# Patient Record
Sex: Male | Born: 1975 | Race: Black or African American | Hispanic: No | Marital: Married | Smoking: Light tobacco smoker
Health system: Southern US, Community
[De-identification: ages and names within clinical notes are randomized; demographics above are authoritative.]

## PROBLEM LIST (undated history)

## (undated) DIAGNOSIS — R569 Unspecified convulsions: Secondary | ICD-10-CM

## (undated) DIAGNOSIS — Z789 Other specified health status: Secondary | ICD-10-CM

## (undated) HISTORY — PX: TONSILLECTOMY: SUR1361

---

## 1998-09-05 ENCOUNTER — Encounter: Admission: RE | Admit: 1998-09-05 | Discharge: 1998-09-05 | Payer: Self-pay | Admitting: *Deleted

## 2000-01-25 ENCOUNTER — Emergency Department (HOSPITAL_COMMUNITY): Admission: EM | Admit: 2000-01-25 | Discharge: 2000-01-25 | Payer: Self-pay | Admitting: Internal Medicine

## 2002-07-14 ENCOUNTER — Emergency Department (HOSPITAL_COMMUNITY): Admission: EM | Admit: 2002-07-14 | Discharge: 2002-07-14 | Payer: Self-pay | Admitting: Emergency Medicine

## 2002-07-14 ENCOUNTER — Encounter: Payer: Self-pay | Admitting: Emergency Medicine

## 2002-07-23 ENCOUNTER — Emergency Department (HOSPITAL_COMMUNITY): Admission: EM | Admit: 2002-07-23 | Discharge: 2002-07-23 | Payer: Self-pay | Admitting: Emergency Medicine

## 2005-03-21 ENCOUNTER — Emergency Department (HOSPITAL_COMMUNITY): Admission: EM | Admit: 2005-03-21 | Discharge: 2005-03-21 | Payer: Self-pay | Admitting: Emergency Medicine

## 2005-05-10 ENCOUNTER — Emergency Department (HOSPITAL_COMMUNITY): Admission: EM | Admit: 2005-05-10 | Discharge: 2005-05-10 | Payer: Self-pay | Admitting: Emergency Medicine

## 2005-05-21 ENCOUNTER — Emergency Department (HOSPITAL_COMMUNITY): Admission: EM | Admit: 2005-05-21 | Discharge: 2005-05-21 | Payer: Self-pay | Admitting: Emergency Medicine

## 2006-04-23 ENCOUNTER — Emergency Department (HOSPITAL_COMMUNITY): Admission: EM | Admit: 2006-04-23 | Discharge: 2006-04-23 | Payer: Self-pay | Admitting: Emergency Medicine

## 2008-04-04 ENCOUNTER — Emergency Department (HOSPITAL_COMMUNITY): Admission: EM | Admit: 2008-04-04 | Discharge: 2008-04-04 | Payer: Self-pay | Admitting: Emergency Medicine

## 2019-07-17 ENCOUNTER — Emergency Department (HOSPITAL_COMMUNITY): Payer: Self-pay

## 2019-07-17 ENCOUNTER — Encounter (HOSPITAL_COMMUNITY): Payer: Self-pay | Admitting: Emergency Medicine

## 2019-07-17 ENCOUNTER — Emergency Department (HOSPITAL_COMMUNITY)
Admission: EM | Admit: 2019-07-17 | Discharge: 2019-07-17 | Disposition: A | Discharge status: Home / Self Care | Payer: Self-pay | Attending: Emergency Medicine | Admitting: Emergency Medicine

## 2019-07-17 ENCOUNTER — Other Ambulatory Visit: Payer: Self-pay

## 2019-07-17 DIAGNOSIS — W458XXA Other foreign body or object entering through skin, initial encounter: Secondary | ICD-10-CM | POA: Insufficient documentation

## 2019-07-17 DIAGNOSIS — Y929 Unspecified place or not applicable: Secondary | ICD-10-CM | POA: Insufficient documentation

## 2019-07-17 DIAGNOSIS — Y999 Unspecified external cause status: Secondary | ICD-10-CM | POA: Insufficient documentation

## 2019-07-17 DIAGNOSIS — Y9389 Activity, other specified: Secondary | ICD-10-CM | POA: Insufficient documentation

## 2019-07-17 DIAGNOSIS — S61442A Puncture wound with foreign body of left hand, initial encounter: Secondary | ICD-10-CM | POA: Insufficient documentation

## 2019-07-17 DIAGNOSIS — F17290 Nicotine dependence, other tobacco product, uncomplicated: Secondary | ICD-10-CM | POA: Insufficient documentation

## 2019-07-17 DIAGNOSIS — Z23 Encounter for immunization: Secondary | ICD-10-CM | POA: Insufficient documentation

## 2019-07-17 DIAGNOSIS — S6992XA Unspecified injury of left wrist, hand and finger(s), initial encounter: Secondary | ICD-10-CM

## 2019-07-17 DIAGNOSIS — S60552A Superficial foreign body of left hand, initial encounter: Secondary | ICD-10-CM

## 2019-07-17 MED ORDER — OXYCODONE-ACETAMINOPHEN 5-325 MG PO TABS
1.0000 | ORAL_TABLET | Freq: Once | ORAL | Status: AC
  Administered 2019-07-17: 12:00:00 1 via ORAL
  Filled 2019-07-17: qty 1

## 2019-07-17 MED ORDER — OXYCODONE-ACETAMINOPHEN 5-325 MG PO TABS: 1.0000 | ORAL_TABLET | ORAL | 0 refills | Status: DC | PRN

## 2019-07-17 MED ORDER — LIDOCAINE HCL (PF) 1 % IJ SOLN
10.0000 mL | Freq: Once | INTRAMUSCULAR | Status: AC
  Administered 2019-07-17: 10 mL
  Filled 2019-07-17: qty 10

## 2019-07-17 MED ORDER — LIDOCAINE HCL (PF) 1 % IJ SOLN
INTRAMUSCULAR | Status: AC
  Filled 2019-07-17: qty 10

## 2019-07-17 MED ORDER — TETANUS-DIPHTH-ACELL PERTUSSIS 5-2.5-18.5 LF-MCG/0.5 IM SUSP
0.5000 mL | Freq: Once | INTRAMUSCULAR | Status: AC
  Administered 2019-07-17: 0.5 mL via INTRAMUSCULAR
  Filled 2019-07-17: qty 0.5

## 2019-07-17 MED ORDER — CEPHALEXIN 500 MG PO CAPS: 500.0000 mg | ORAL_CAPSULE | Freq: Two times a day (BID) | ORAL | 0 refills | Status: AC

## 2019-07-17 NOTE — Consult Note (Signed)
Reason for Consult:Arrow to hand Referring Physician: A Sabino Donovan is an 43 y.o. male.  HPI: Kathreen Cosier was practicing with his bow when one of the arrows broke upon release, driving the shaft through his left hand. He came to the ED for evaluation and hand surgery was consulted. He is RHD.  History reviewed. No pertinent past medical history.  History reviewed. No pertinent surgical history.  No family history on file.  Social History:  reports that he has been smoking cigars. He has never used smokeless tobacco. He reports current alcohol use. He reports previous drug use.  Allergies: No Known Allergies  Medications: I have reviewed the patient's current medications.  No results found for this or any previous visit (from the past 48 hour(s)).  Dg Hand Complete Left  Result Date: 07/17/2019 CLINICAL DATA:  Arrow in hand EXAM: LEFT HAND - COMPLETE 3+ VIEW COMPARISON:  None. FINDINGS: No fracture or dislocation of the left hand. An arrow traverses the soft tissues of the thenar eminence and does not appear to abut or penetrate the osseous structures. Joint spaces are preserved. No other foreign body appreciated. IMPRESSION: No fracture or dislocation of the left hand. An arrow traverses the soft tissues of the thenar eminence and does not appear to abut or penetrate the osseous structures. Electronically Signed   By: Eddie Candle M.D.   On: 07/17/2019 11:46    Review of Systems  Constitutional: Negative for weight loss.  HENT: Negative for ear discharge, ear pain, hearing loss and tinnitus.   Eyes: Negative for blurred vision, double vision, photophobia and pain.  Respiratory: Negative for cough, sputum production and shortness of breath.   Cardiovascular: Negative for chest pain.  Gastrointestinal: Negative for abdominal pain, nausea and vomiting.  Genitourinary: Negative for dysuria, flank pain, frequency and urgency.  Musculoskeletal: Positive for joint pain (Left hand).  Negative for back pain, falls, myalgias and neck pain.  Neurological: Negative for dizziness, tingling, sensory change, focal weakness, loss of consciousness and headaches.  Endo/Heme/Allergies: Does not bruise/bleed easily.  Psychiatric/Behavioral: Negative for depression, memory loss and substance abuse. The patient is not nervous/anxious.    Blood pressure (!) 150/77, pulse 86, resp. rate 18, SpO2 99 %. Physical Exam  Constitutional: He appears well-developed and well-nourished. No distress.  HENT:  Head: Normocephalic and atraumatic.  Eyes: Conjunctivae are normal. Right eye exhibits no discharge. Left eye exhibits no discharge. No scleral icterus.  Neck: Normal range of motion.  Cardiovascular: Normal rate and regular rhythm.  Respiratory: Effort normal. No respiratory distress.  Musculoskeletal:     Comments: Left shoulder, elbow, wrist, digits- Arrow through thenar, moderately TTP, no instability, no blocks to motion  Sens  Ax/R/M/U intact  Mot   Ax/ R/ PIN/ M/ AIN/ U intact  Rad 2+  Neurological: He is alert.  Skin: Skin is warm and dry. He is not diaphoretic.  Psychiatric: He has a normal mood and affect. His behavior is normal.    Assessment/Plan: Left hand arrow injury -- Instructed EDPA to numb and remove in the direction of it's original path. Then irrigate the wound copiously and repeat x-ray to look for any e/o retained FB. Advised leaving wounds open for drainage and f/u with Dr. Fredna Dow next week.    Lisette Abu, PA-C Orthopedic Surgery [phone] 07/17/2019, 11:54 AM

## 2019-07-17 NOTE — ED Provider Notes (Signed)
[facility] EMERGENCY DEPARTMENT Provider Note   CSN: [id number] Arrival date & time: 07/17/19  1054     History   Chief Complaint Chief Complaint  Patient presents with   Hand Injury    Arrow Lodged in Left Hand     HPI Nicholas Fletcher is a 43 y.o. male who presents to the ED today with a left hand injury.  He reports he was in target practice today with his bow and arrow when he accidentally shot himself in the left hand with the area.  He states that he believes that the area must of had a break in it and when he shot it at the front part went forward but the back part dislodged and went into his hand.  Occurred about 1 hour ago.  Patient does not think his tetanus is up-to-date.  He is complaining of pain to the area.  He is controlled.  He is not anticoagulated.  He denies any numbness or tingling to his fingers.  No other complaints at this time.      History reviewed. No pertinent past medical history.  There are no active problems to display for this patient.   History reviewed. No pertinent surgical history.      Home Medications    Prior to Admission medications   Not on File    Family History No family history on file.  Social History Social History   Tobacco Use   Smoking status: Light Tobacco Smoker    Types: Cigars   Smokeless tobacco: Never Used  Substance Use Topics   Alcohol use: Yes   Drug use: Not Currently     Allergies   Patient has no known allergies.   Review of Systems Review of Systems  Constitutional: Negative for chills and fever.  HENT: Negative for congestion.   Eyes: Negative for visual disturbance.  Respiratory: Negative for shortness of breath.   Cardiovascular: Negative for chest pain.  Gastrointestinal: Negative for nausea and vomiting.  Genitourinary: Negative for difficulty urinating.  Musculoskeletal: Positive for arthralgias.  Skin: Positive for wound.  Neurological: Negative for weakness  and numbness.       Negative for paresthesias     Physical Exam Updated Vital Signs BP (!) 150/77 (BP Location: Right Arm)    Pulse 86    Resp 18    SpO2 99%   Physical Exam Vitals signs and nursing note reviewed.  Constitutional:      Appearance: He is not ill-appearing.  HENT:     Head: Normocephalic and atraumatic.  Eyes:     Conjunctiva/sclera: Conjunctivae normal.  Neck:     Musculoskeletal: Neck supple.  Cardiovascular:     Rate and Rhythm: Normal rate and regular rhythm.  Pulmonary:     Effort: Pulmonary effort is normal.     Breath sounds: Normal breath sounds.  Abdominal:     Palpations: Abdomen is soft.     Tenderness: There is no abdominal tenderness.  Musculoskeletal:     Comments: Back portion of arrow lodged into left hand. It appears to go through the thenar eminence on palmar aspect. Bleeding controlled. Cap refill < 2 seconds to all fingers on left hand. Pt able to wiggle all fingers without difficulty. NVI. Strength and sensation intact. 2+ radial pulse  Skin:    General: Skin is warm and dry.  Neurological:     Mental Status: He is alert.          ED  Treatments / Results  Labs (all labs ordered are listed, but only abnormal results are displayed) Labs Reviewed - No data to display  EKG None  Radiology Dg Hand Complete Left  Result Date: 07/17/2019 CLINICAL DATA:  S/p arrow removal EXAM: LEFT HAND - COMPLETE 3+ VIEW COMPARISON:  Same day radiographs FINDINGS: Interval removal of a previously seen arrow penetrating the soft tissues of the left thenar eminence. There is subcutaneous emphysema at the site of the wound without fracture or residual radiopaque foreign body. Joint spaces are preserved. IMPRESSION: Interval removal of a previously seen arrow penetrating the soft tissues of the left thenar eminence. There is subcutaneous emphysema at the site of the wound without fracture or residual radiopaque foreign body. Electronically Signed   By:  Eddie Candle M.D.   On: 07/17/2019 13:35   Dg Hand Complete Left  Result Date: 07/17/2019 CLINICAL DATA:  Arrow in hand EXAM: LEFT HAND - COMPLETE 3+ VIEW COMPARISON:  None. FINDINGS: No fracture or dislocation of the left hand. An arrow traverses the soft tissues of the thenar eminence and does not appear to abut or penetrate the osseous structures. Joint spaces are preserved. No other foreign body appreciated. IMPRESSION: No fracture or dislocation of the left hand. An arrow traverses the soft tissues of the thenar eminence and does not appear to abut or penetrate the osseous structures. Electronically Signed   By: Eddie Candle M.D.   On: 07/17/2019 11:46    Procedures .Foreign Body Removal  Date/Time: 07/17/2019 12:51 PM Performed by: Eustaquio Maize, PA-C Authorized by: Eustaquio Maize, PA-C  Consent: Verbal consent obtained. Consent given by: patient Body area: skin General location: upper extremity Location details: left hand Anesthesia: local infiltration  Anesthesia: Local Anesthetic: lidocaine 1% without epinephrine Anesthetic total: 15 mL Complexity: simple 1 objects recovered. Objects recovered: arrow  Post-procedure assessment: foreign body removed   (including critical care time)  Medications Ordered in ED Medications  Tdap (BOOSTRIX) injection 0.5 mL (0.5 mLs Intramuscular Given 07/17/19 1152)  oxyCODONE-acetaminophen (PERCOCET/ROXICET) 5-325 MG per tablet 1 tablet (1 tablet Oral Given 07/17/19 1151)  lidocaine (PF) (XYLOCAINE) 1 % injection 10 mL (10 mLs Infiltration Given 07/17/19 1315)     Initial Impression / Assessment and Plan / ED Course  I have reviewed the triage vital signs and the nursing notes.  Pertinent labs & imaging results that were available during my care of the patient were reviewed by me and considered in my medical decision making (see chart for details).    43 year old male who presents to the ED today shooting self and hand with wooden  arrow.  Patient is neurovascularly intact on exam.  Bleeding is controlled.  X-ray was obtained prior to being seen. It Is difficult to visualize the wood on x-ray but there does not appear to be any bony involvement at this time.  Given the arrow is wooden I am concerned that there could be splintering with removal.  Will consult hand surgery at this time.  Will update tetanus in the ED and give pain medication.   Discussed case with Hilbert Odor, PA-C with hand surgery.  He will come to the ED to evaluate patient.   Hand surgery suggests removal of arrow in the ED today by myself. He is to follow up outpatient with Dr. Fredna Dow next week.   Arrow successfully removed from hand. Will repeat xray at this time prior to discharge home.   Xray without findings of foreign body. Will discharge patient home  at this time with close hand surgery follow up. Wounds left open to drain. Will prescribe antibiotics at this time as well. Pt counseled on importance of taking full course of abx. All questions answered at this time. Strict return precautions have also been discussed. He is in agreement with plan at this time and stable for discharge home.   This note was prepared using Dragon voice recognition software and may include unintentional dictation errors due to the inherent limitations of voice recognition software.       Final Clinical Impressions(s) / ED Diagnoses   Final diagnoses:  Injury of left hand, initial encounter  Foreign body in hand, left, initial encounter    ED Discharge Orders    None       Tanda Rockers, Cordelia Poche 07/17/19 1913    Lorre Nick, MD 07/20/19 1231

## 2019-07-17 NOTE — ED Triage Notes (Signed)
Pt presents with wooden arrowllodged in his left hand. He was practicing when the arrow show backwards. No bleeding noted. Pt is stable. CNS intact. Ice placed on injury.

## 2019-07-17 NOTE — ED Notes (Signed)
Patient transported to X-ray 

## 2019-07-17 NOTE — Discharge Instructions (Signed)
Please take antibiotics as prescribed. It is important to take them until they run out.  Follow up with Dr. Fredna Dow. Please call their office to schedule an appointment  Keep wound clean and dry I have prescribed a short course of pain medication to take if needbe. It is recommended to take Ibuprofen for the pain and then if you are still having pain to take the Percocet.

## 2019-07-17 NOTE — ED Notes (Signed)
Wound soaked/cleaned with sterile water and betadine.

## 2019-07-17 NOTE — ED Notes (Signed)
Patient verbalizes understanding of discharge instructions. Opportunity for questioning and answers were provided. Armband removed by staff, pt discharged from ED.  

## 2020-10-03 IMAGING — CR DG HAND COMPLETE 3+V*L*
3 series · 3 of 3 positions shown · non-contrast
Comparison: None.

CLINICAL DATA: Arrow in hand

EXAM:
LEFT HAND - COMPLETE 3+ VIEW

[hand pa]
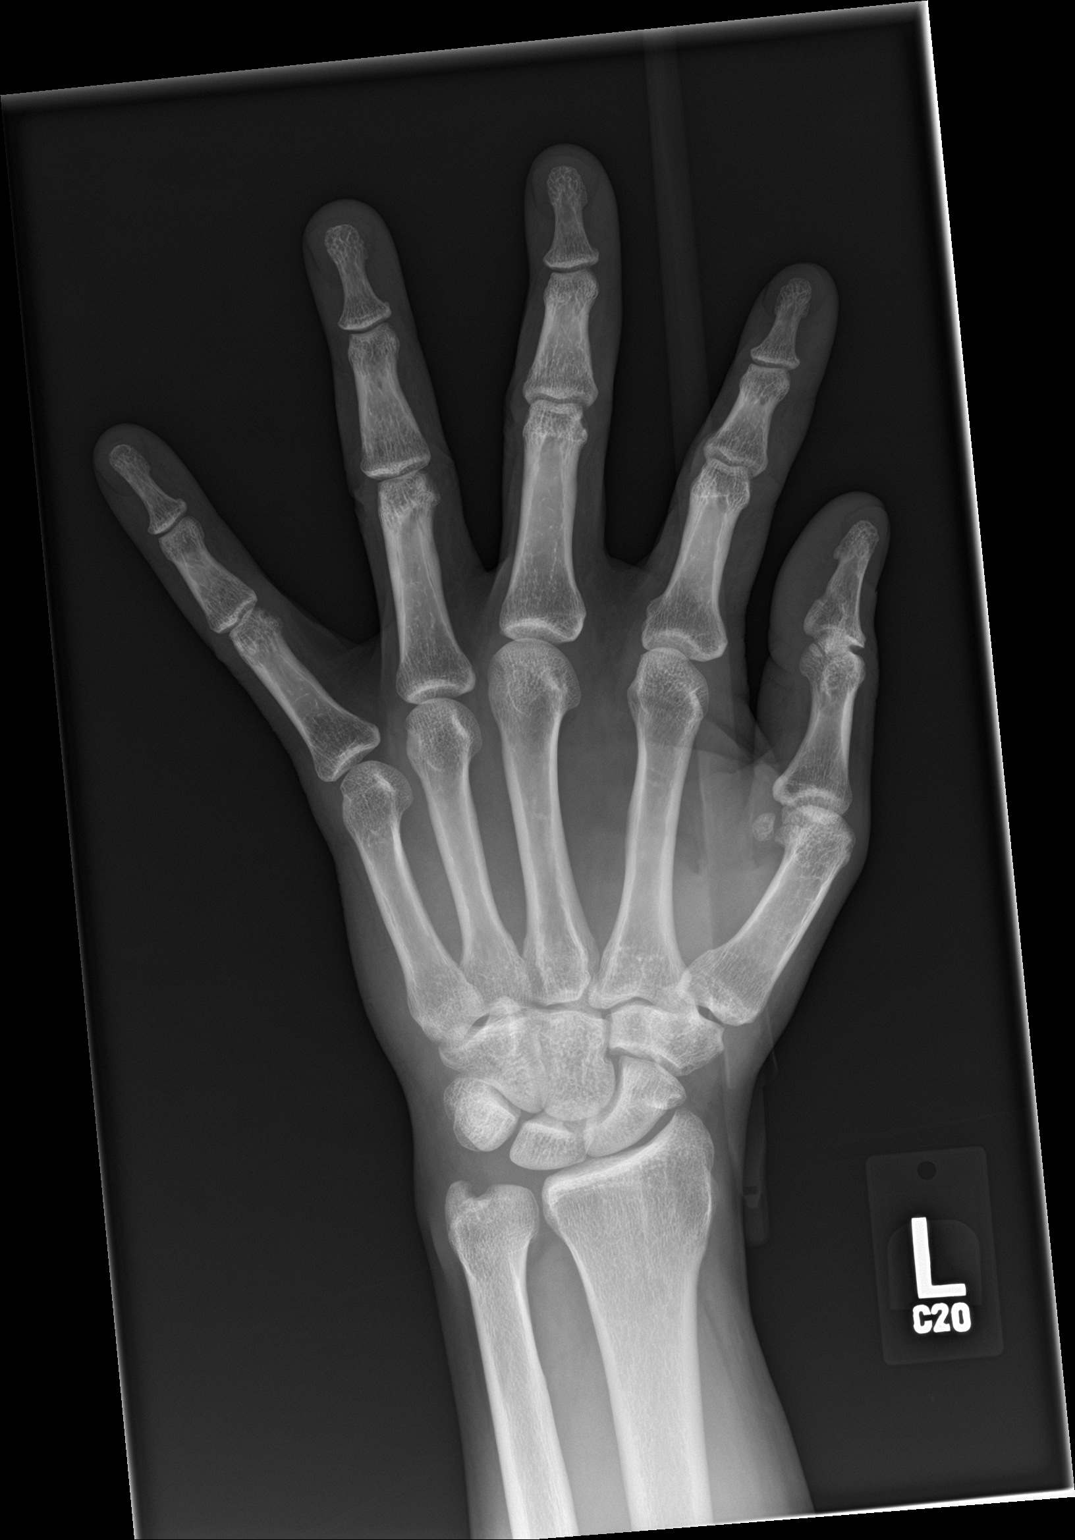

[hand obl]
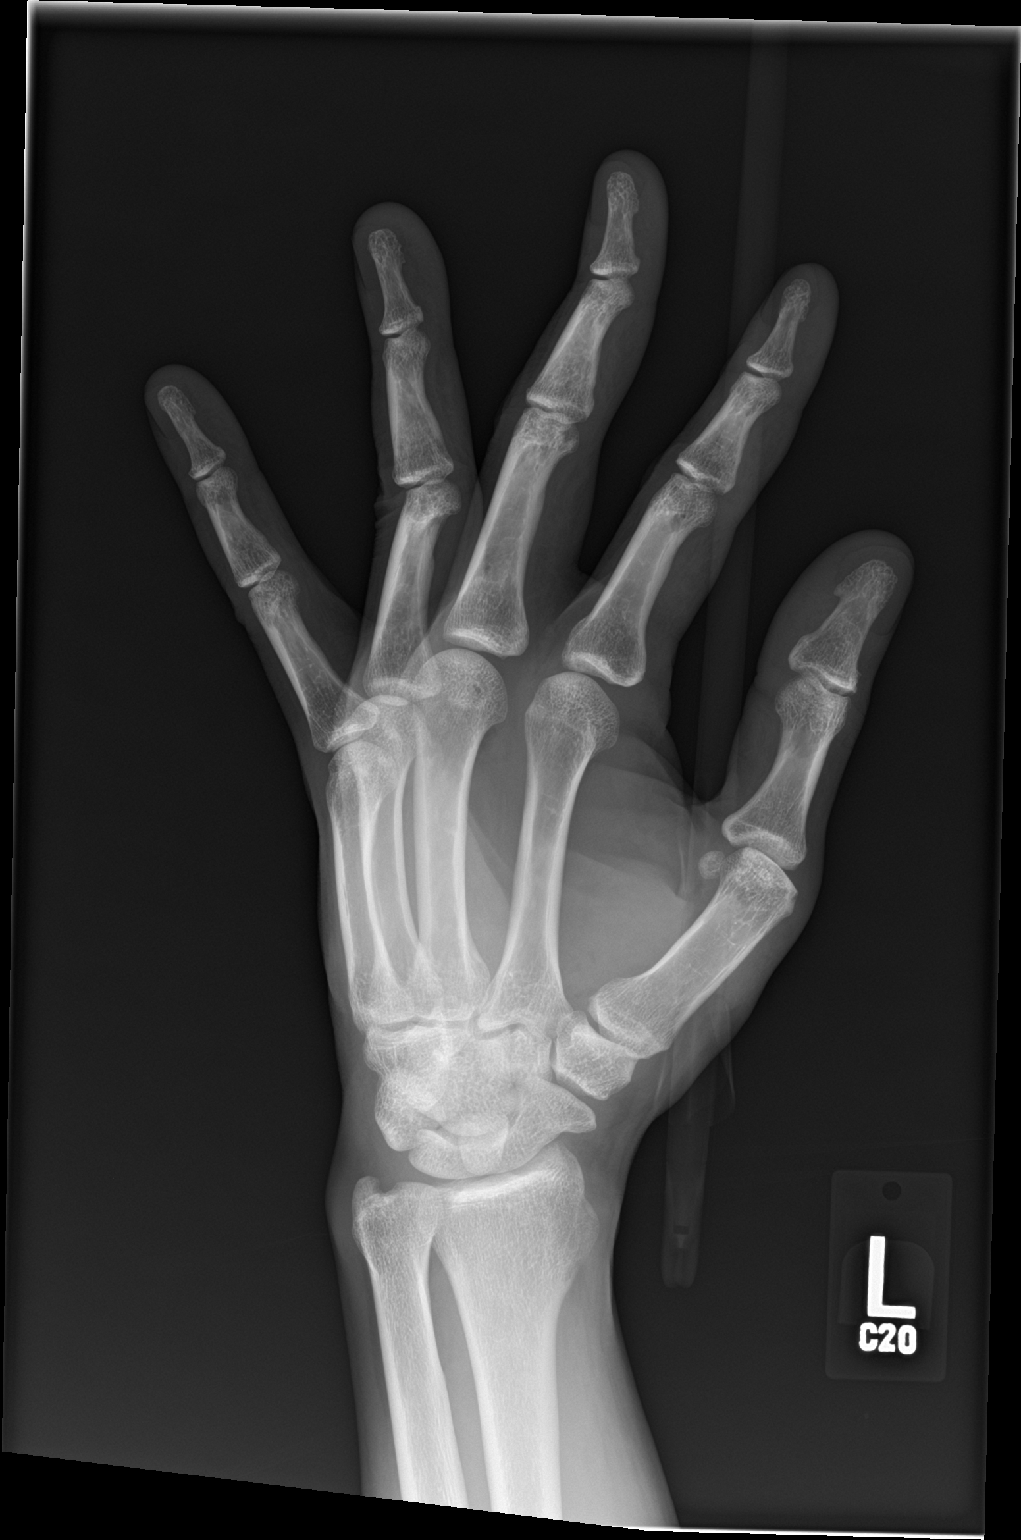

[hand lat]
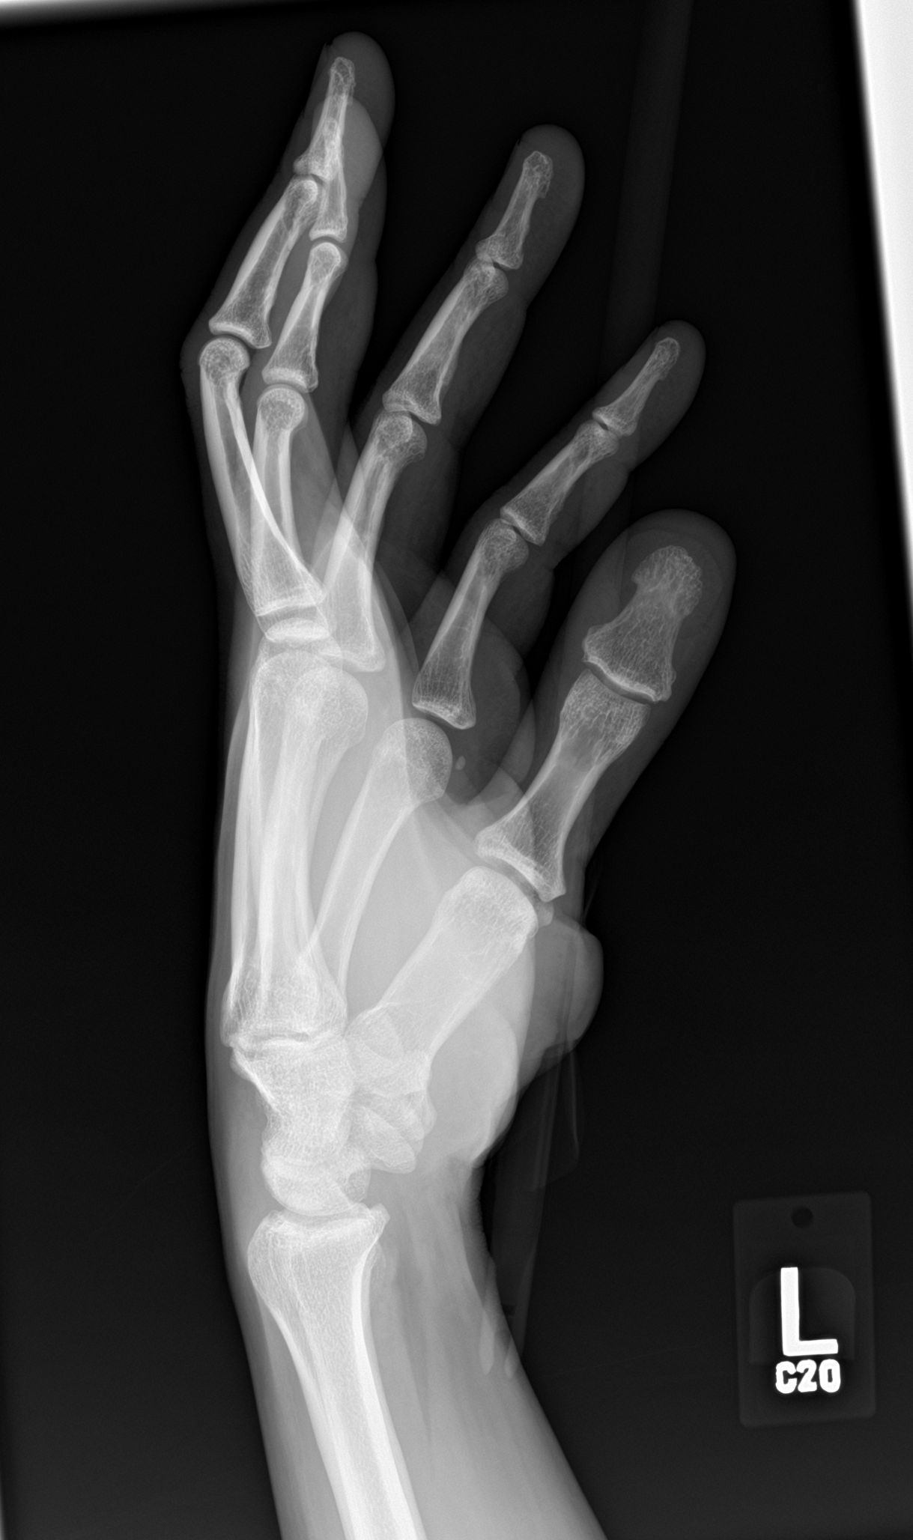

[3 of 3 positions shown; findings below may reference images not displayed]

FINDINGS: No fracture or dislocation of the left hand. An arrow traverses the
soft tissues of the thenar eminence and does not appear to abut or
penetrate the osseous structures. Joint spaces are preserved. No
other foreign body appreciated.
IMPRESSION: No fracture or dislocation of the left hand. An arrow traverses the
soft tissues of the thenar eminence and does not appear to abut or
penetrate the osseous structures.

## 2020-10-03 IMAGING — DX DG HAND COMPLETE 3+V*L*
3 series · 3 of 3 positions shown · non-contrast
Comparison: Same day radiographs

CLINICAL DATA: S/p arrow removal

EXAM:
LEFT HAND - COMPLETE 3+ VIEW

[hand pa]
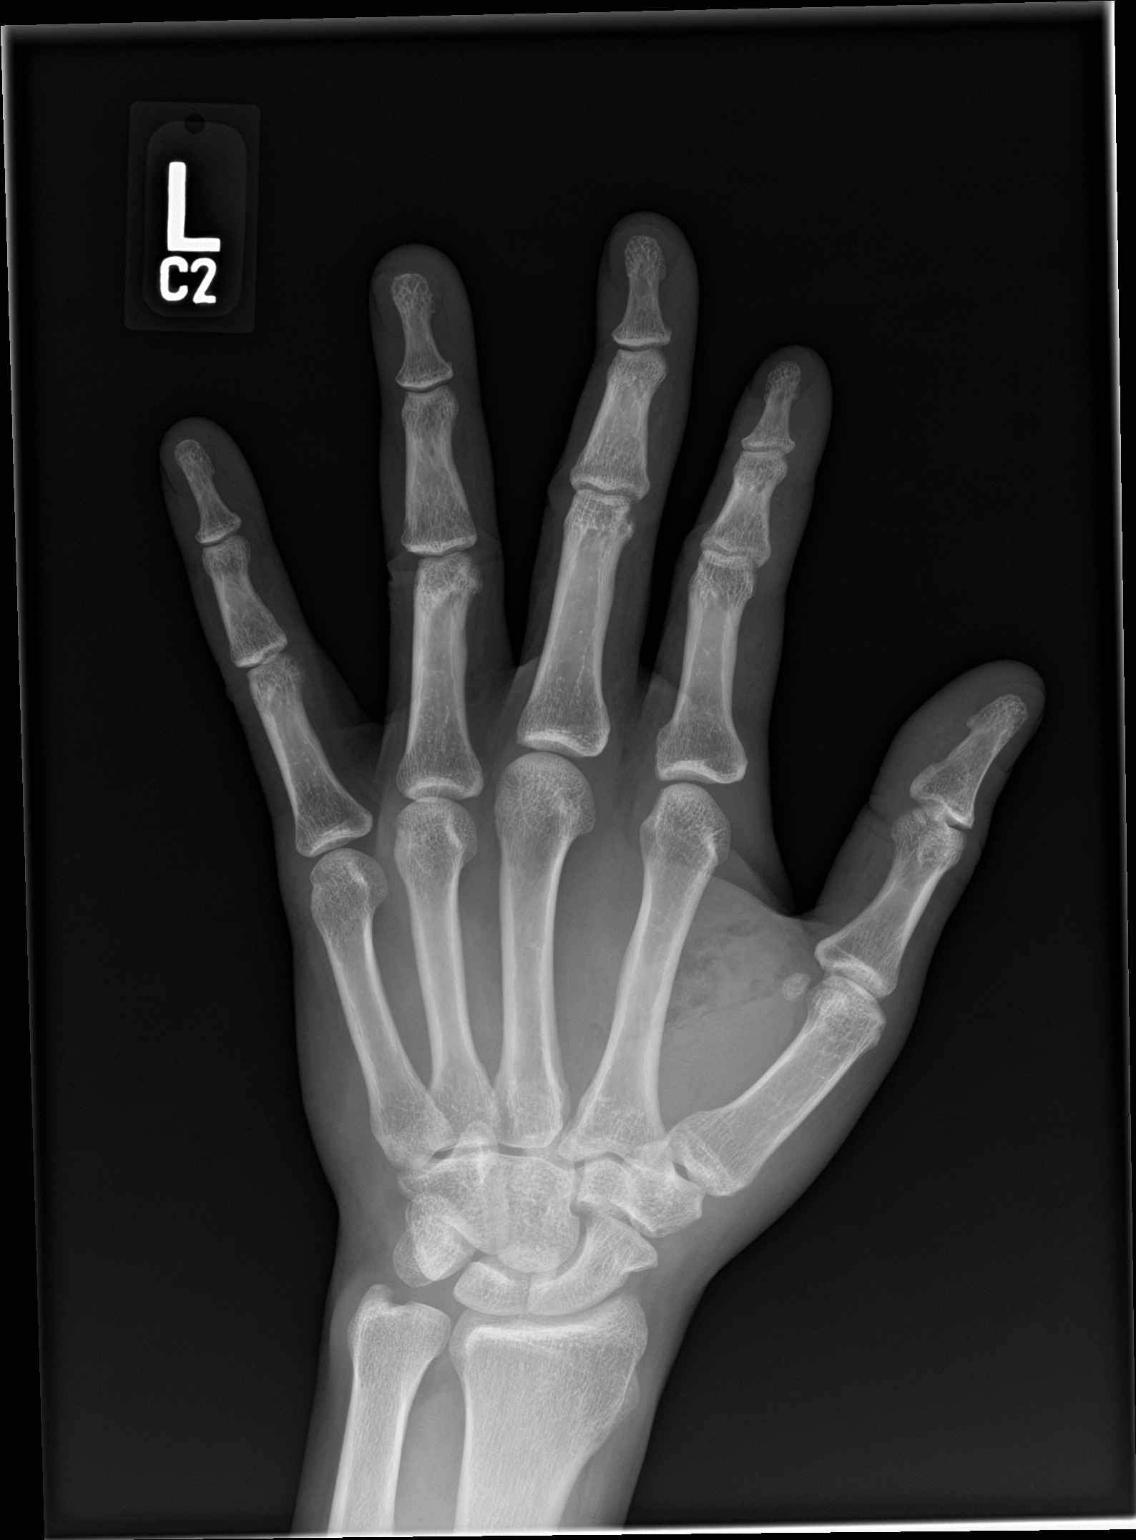

[hand obl]
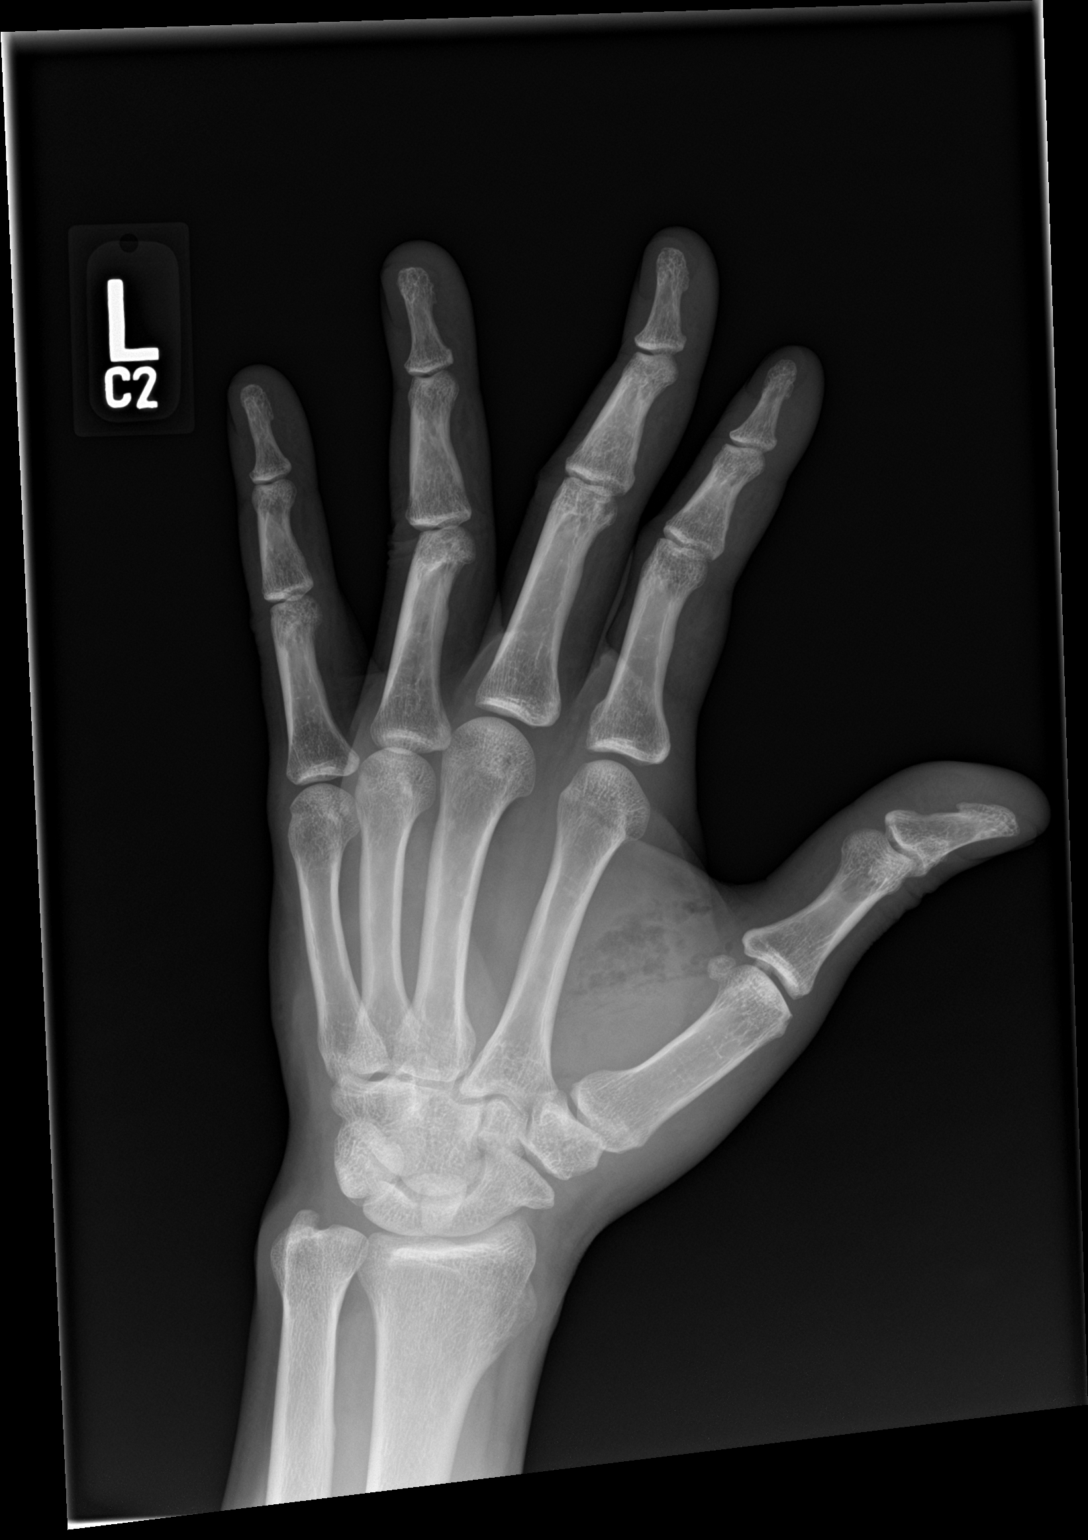

[hand lat]
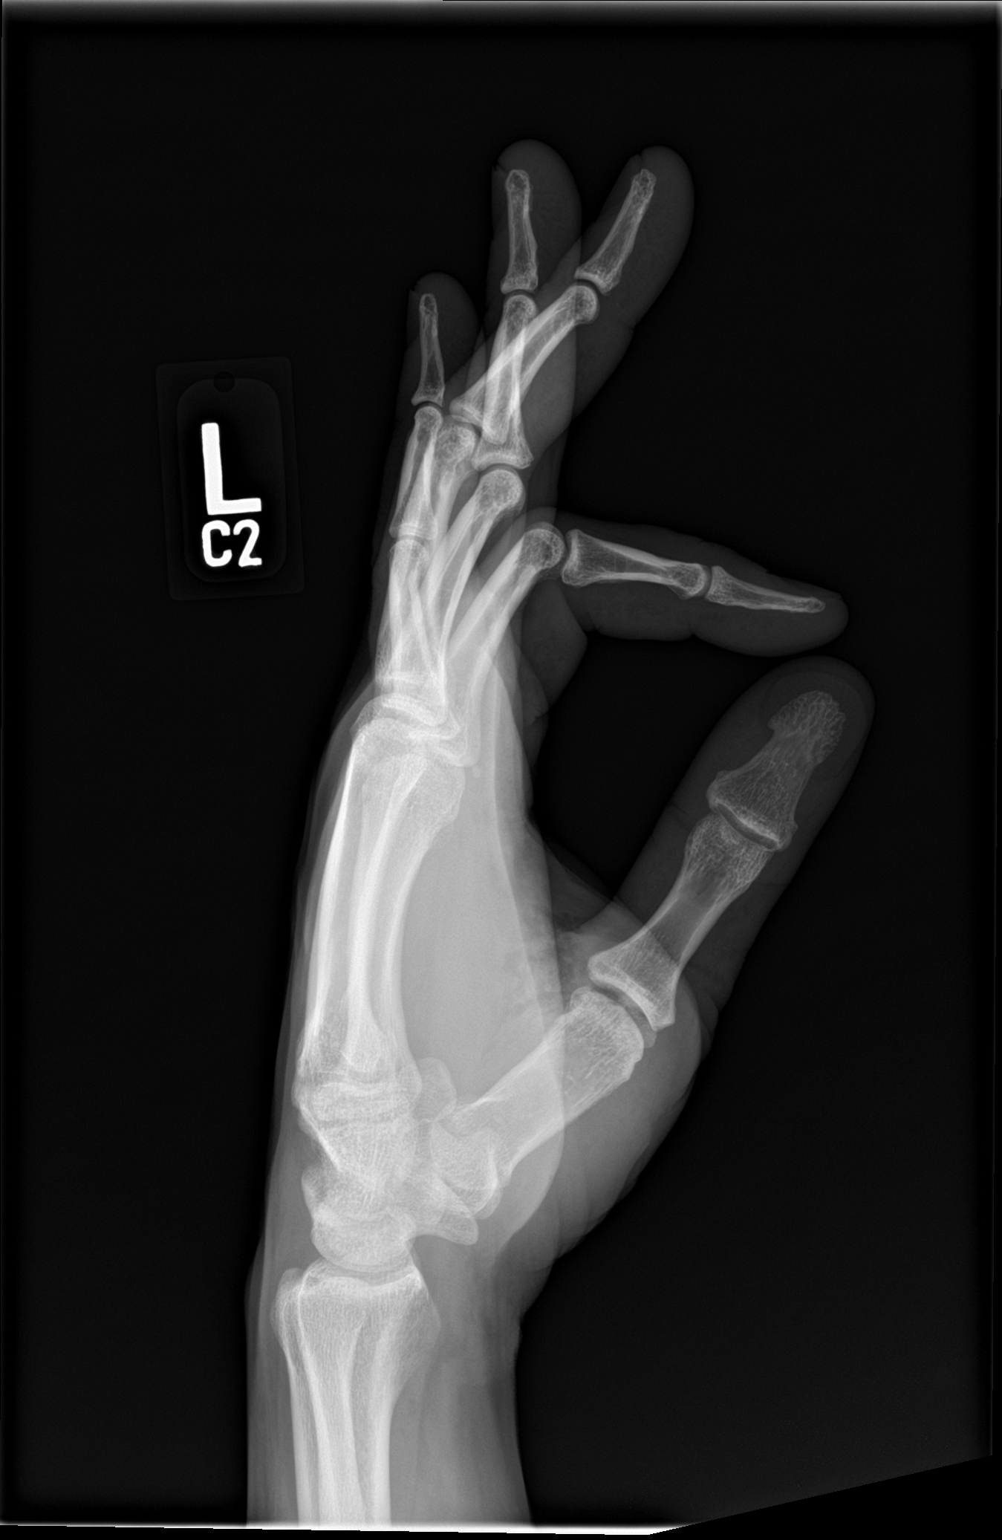

[3 of 3 positions shown; findings below may reference images not displayed]

FINDINGS: Interval removal of a previously seen arrow penetrating the soft
tissues of the left thenar eminence. There is subcutaneous emphysema
at the site of the wound without fracture or residual radiopaque
foreign body. Joint spaces are preserved.
IMPRESSION: Interval removal of a previously seen arrow penetrating the soft
tissues of the left thenar eminence. There is subcutaneous emphysema
at the site of the wound without fracture or residual radiopaque
foreign body.

## 2021-05-17 ENCOUNTER — Other Ambulatory Visit: Payer: Self-pay

## 2021-05-17 ENCOUNTER — Ambulatory Visit (INDEPENDENT_AMBULATORY_CARE_PROVIDER_SITE_OTHER): Payer: Self-pay

## 2021-05-17 ENCOUNTER — Ambulatory Visit (HOSPITAL_COMMUNITY)
Admission: EM | Admit: 2021-05-17 | Discharge: 2021-05-17 | Disposition: A | Discharge status: Home / Self Care | Payer: Self-pay | Attending: Urgent Care | Admitting: Urgent Care

## 2021-05-17 ENCOUNTER — Encounter (HOSPITAL_COMMUNITY): Payer: Self-pay

## 2021-05-17 DIAGNOSIS — M549 Dorsalgia, unspecified: Secondary | ICD-10-CM

## 2021-05-17 DIAGNOSIS — M545 Low back pain, unspecified: Secondary | ICD-10-CM

## 2021-05-17 DIAGNOSIS — S39012A Strain of muscle, fascia and tendon of lower back, initial encounter: Secondary | ICD-10-CM

## 2021-05-17 MED ORDER — TIZANIDINE HCL 4 MG PO TABS: 4.0000 mg | ORAL_TABLET | Freq: Three times a day (TID) | ORAL | 0 refills | Status: DC | PRN

## 2021-05-17 MED ORDER — NAPROXEN 500 MG PO TABS: 500.0000 mg | ORAL_TABLET | Freq: Two times a day (BID) | ORAL | 0 refills | Status: DC
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
## 2021-06-06 ENCOUNTER — Ambulatory Visit (HOSPITAL_COMMUNITY): Payer: Self-pay

## 2021-09-24 ENCOUNTER — Encounter: Payer: Self-pay | Admitting: Emergency Medicine

## 2021-09-24 ENCOUNTER — Emergency Department: Payer: Self-pay

## 2021-09-24 ENCOUNTER — Other Ambulatory Visit: Payer: Self-pay

## 2021-09-24 ENCOUNTER — Inpatient Hospital Stay
Admission: EM | Admit: 2021-09-24 | Discharge: 2021-09-29 | DRG: 432 | Disposition: A | Discharge status: Home / Self Care | Payer: Self-pay | Attending: Internal Medicine | Admitting: Internal Medicine

## 2021-09-24 DIAGNOSIS — Z6823 Body mass index (BMI) 23.0-23.9, adult: Secondary | ICD-10-CM

## 2021-09-24 DIAGNOSIS — F17290 Nicotine dependence, other tobacco product, uncomplicated: Secondary | ICD-10-CM | POA: Diagnosis present

## 2021-09-24 DIAGNOSIS — R911 Solitary pulmonary nodule: Secondary | ICD-10-CM | POA: Diagnosis present

## 2021-09-24 DIAGNOSIS — R948 Abnormal results of function studies of other organs and systems: Secondary | ICD-10-CM | POA: Diagnosis present

## 2021-09-24 DIAGNOSIS — K5730 Diverticulosis of large intestine without perforation or abscess without bleeding: Secondary | ICD-10-CM | POA: Diagnosis present

## 2021-09-24 DIAGNOSIS — K828 Other specified diseases of gallbladder: Secondary | ICD-10-CM | POA: Diagnosis present

## 2021-09-24 DIAGNOSIS — Z801 Family history of malignant neoplasm of trachea, bronchus and lung: Secondary | ICD-10-CM

## 2021-09-24 DIAGNOSIS — R6881 Early satiety: Secondary | ICD-10-CM | POA: Diagnosis present

## 2021-09-24 DIAGNOSIS — Z20822 Contact with and (suspected) exposure to covid-19: Secondary | ICD-10-CM | POA: Diagnosis present

## 2021-09-24 DIAGNOSIS — K760 Fatty (change of) liver, not elsewhere classified: Secondary | ICD-10-CM | POA: Diagnosis present

## 2021-09-24 DIAGNOSIS — Q402 Other specified congenital malformations of stomach: Secondary | ICD-10-CM

## 2021-09-24 DIAGNOSIS — F1020 Alcohol dependence, uncomplicated: Secondary | ICD-10-CM | POA: Diagnosis present

## 2021-09-24 DIAGNOSIS — K91840 Postprocedural hemorrhage and hematoma of a digestive system organ or structure following a digestive system procedure: Secondary | ICD-10-CM | POA: Diagnosis not present

## 2021-09-24 DIAGNOSIS — E876 Hypokalemia: Secondary | ICD-10-CM | POA: Diagnosis present

## 2021-09-24 DIAGNOSIS — R1011 Right upper quadrant pain: Secondary | ICD-10-CM

## 2021-09-24 DIAGNOSIS — K759 Inflammatory liver disease, unspecified: Secondary | ICD-10-CM

## 2021-09-24 DIAGNOSIS — D7589 Other specified diseases of blood and blood-forming organs: Secondary | ICD-10-CM | POA: Diagnosis present

## 2021-09-24 DIAGNOSIS — K7010 Alcoholic hepatitis without ascites: Principal | ICD-10-CM | POA: Diagnosis present

## 2021-09-24 DIAGNOSIS — K635 Polyp of colon: Secondary | ICD-10-CM | POA: Diagnosis present

## 2021-09-24 DIAGNOSIS — N179 Acute kidney failure, unspecified: Secondary | ICD-10-CM | POA: Diagnosis present

## 2021-09-24 DIAGNOSIS — R1310 Dysphagia, unspecified: Secondary | ICD-10-CM | POA: Diagnosis present

## 2021-09-24 DIAGNOSIS — R9431 Abnormal electrocardiogram [ECG] [EKG]: Secondary | ICD-10-CM | POA: Diagnosis present

## 2021-09-24 DIAGNOSIS — K5732 Diverticulitis of large intestine without perforation or abscess without bleeding: Secondary | ICD-10-CM | POA: Diagnosis present

## 2021-09-24 DIAGNOSIS — Z8261 Family history of arthritis: Secondary | ICD-10-CM

## 2021-09-24 DIAGNOSIS — K2091 Esophagitis, unspecified with bleeding: Secondary | ICD-10-CM | POA: Diagnosis present

## 2021-09-24 DIAGNOSIS — Y838 Other surgical procedures as the cause of abnormal reaction of the patient, or of later complication, without mention of misadventure at the time of the procedure: Secondary | ICD-10-CM | POA: Diagnosis not present

## 2021-09-24 DIAGNOSIS — R634 Abnormal weight loss: Secondary | ICD-10-CM | POA: Diagnosis present

## 2021-09-24 DIAGNOSIS — R8290 Unspecified abnormal findings in urine: Secondary | ICD-10-CM | POA: Diagnosis present

## 2021-09-24 DIAGNOSIS — R066 Hiccough: Secondary | ICD-10-CM | POA: Diagnosis not present

## 2021-09-24 LAB — URINALYSIS, COMPLETE (UACMP) WITH MICROSCOPIC
Glucose, UA: 100 mg/dL — AB
Hgb urine dipstick: NEGATIVE
Ketones, ur: 40 mg/dL — AB
Leukocytes,Ua: NEGATIVE
Nitrite: POSITIVE — AB
Protein, ur: 100 mg/dL — AB
Specific Gravity, Urine: >1.03 — ABNORMAL HIGH (ref 1.005–1.030)
pH: 5.5 (ref 5.0–8.0)

## 2021-09-24 LAB — CBC
HCT: 45.6 % (ref 39.0–52.0)
Hemoglobin: 16.3 g/dL (ref 13.0–17.0)
MCH: 36.3 pg — ABNORMAL HIGH (ref 26.0–34.0)
MCHC: 35.7 g/dL (ref 30.0–36.0)
MCV: 101.6 fL — ABNORMAL HIGH (ref 80.0–100.0)
Platelets: 290 10*3/uL (ref 150–400)
RBC: 4.49 MIL/uL (ref 4.22–5.81)
RDW: 11.7 % (ref 11.5–15.5)
WBC: 10.3 10*3/uL (ref 4.0–10.5)
nRBC: 0 % (ref 0.0–0.2)

## 2021-09-24 LAB — HEPATIC FUNCTION PANEL
ALT: 72 U/L — ABNORMAL HIGH (ref 0–44)
AST: 240 U/L — ABNORMAL HIGH (ref 15–41)
Albumin: 4 g/dL (ref 3.5–5.0)
Alkaline Phosphatase: 92 U/L (ref 38–126)
Bilirubin, Direct: 1 mg/dL — ABNORMAL HIGH (ref 0.0–0.2)
Indirect Bilirubin: 2.9 mg/dL — ABNORMAL HIGH (ref 0.3–0.9)
Total Bilirubin: 3.9 mg/dL — ABNORMAL HIGH (ref 0.3–1.2)
Total Protein: 6.7 g/dL (ref 6.5–8.1)

## 2021-09-24 LAB — BASIC METABOLIC PANEL
Anion gap: 16 — ABNORMAL HIGH (ref 5–15)
BUN: 13 mg/dL (ref 6–20)
CO2: 25 mmol/L (ref 22–32)
Calcium: 9 mg/dL (ref 8.9–10.3)
Chloride: 93 mmol/L — ABNORMAL LOW (ref 98–111)
Creatinine, Ser: 1.8 mg/dL — ABNORMAL HIGH (ref 0.61–1.24)
GFR, Estimated: 46 mL/min — ABNORMAL LOW (ref >60)
Glucose, Bld: 164 mg/dL — ABNORMAL HIGH (ref 70–99)
Potassium: 3.4 mmol/L — ABNORMAL LOW (ref 3.5–5.1)
Sodium: 134 mmol/L — ABNORMAL LOW (ref 135–145)

## 2021-09-24 LAB — RESP PANEL BY RT-PCR (FLU A&B, COVID) ARPGX2
Influenza A by PCR: NEGATIVE
Influenza B by PCR: NEGATIVE
SARS Coronavirus 2 by RT PCR: NEGATIVE

## 2021-09-24 LAB — PHOSPHORUS: Phosphorus: 1.4 mg/dL — ABNORMAL LOW (ref 2.5–4.6)

## 2021-09-24 LAB — LIPASE, BLOOD: Lipase: 31 U/L (ref 11–51)

## 2021-09-24 LAB — TROPONIN I (HIGH SENSITIVITY)
Troponin I (High Sensitivity): 14 ng/L (ref <18)
Troponin I (High Sensitivity): 13 ng/L (ref <18)

## 2021-09-24 LAB — MAGNESIUM: Magnesium: 1.7 mg/dL (ref 1.7–2.4)

## 2021-09-24 LAB — PROTIME-INR
INR: 0.9 (ref 0.8–1.2)
Prothrombin Time: 12 seconds (ref 11.4–15.2)

## 2021-09-24 MED ORDER — LORAZEPAM 1 MG PO TABS
1.0000 mg | ORAL_TABLET | ORAL | Status: AC | PRN
  Administered 2021-09-26: 02:00:00 1 mg via ORAL
  Filled 2021-09-24: qty 1

## 2021-09-24 MED ORDER — FOLIC ACID 1 MG PO TABS
1.0000 mg | ORAL_TABLET | Freq: Every day | ORAL | Status: DC
  Administered 2021-09-24 – 2021-09-29 (×3): 1 mg via ORAL
  Filled 2021-09-24 (×3): qty 1

## 2021-09-24 MED ORDER — IOHEXOL 300 MG/ML  SOLN
100.0000 mL | Freq: Once | INTRAMUSCULAR | Status: AC | PRN
  Administered 2021-09-24: 17:00:00 100 mL via INTRAVENOUS
  Filled 2021-09-24: qty 100

## 2021-09-24 MED ORDER — THIAMINE HCL 100 MG/ML IJ SOLN
100.0000 mg | Freq: Every day | INTRAMUSCULAR | Status: DC
  Administered 2021-09-25: 100 mg via INTRAVENOUS
  Filled 2021-09-24: qty 2

## 2021-09-24 MED ORDER — LIDOCAINE VISCOUS HCL 2 % MT SOLN
15.0000 mL | Freq: Once | OROMUCOSAL | Status: AC
  Administered 2021-09-24: 19:00:00 15 mL via ORAL
  Filled 2021-09-24: qty 15

## 2021-09-24 MED ORDER — ALUM & MAG HYDROXIDE-SIMETH 200-200-20 MG/5ML PO SUSP
30.0000 mL | Freq: Once | ORAL | Status: AC
  Administered 2021-09-24: 19:00:00 30 mL via ORAL
  Filled 2021-09-24: qty 30

## 2021-09-24 MED ORDER — METOCLOPRAMIDE HCL 5 MG/ML IJ SOLN
10.0000 mg | Freq: Once | INTRAMUSCULAR | Status: AC
  Administered 2021-09-24: 17:00:00 10 mg via INTRAVENOUS
  Filled 2021-09-24: qty 2

## 2021-09-24 MED ORDER — PANTOPRAZOLE SODIUM 40 MG PO TBEC
40.0000 mg | DELAYED_RELEASE_TABLET | Freq: Two times a day (BID) | ORAL | Status: DC
  Administered 2021-09-25 – 2021-09-27 (×4): 40 mg via ORAL
  Filled 2021-09-24 (×4): qty 1

## 2021-09-24 MED ORDER — THIAMINE HCL 100 MG/ML IJ SOLN
100.0000 mg | Freq: Once | INTRAMUSCULAR | Status: AC
  Administered 2021-09-24: 19:00:00 100 mg via INTRAVENOUS
  Filled 2021-09-24: qty 2

## 2021-09-24 MED ORDER — ENOXAPARIN SODIUM 40 MG/0.4ML IJ SOSY: 40.0000 mg | PREFILLED_SYRINGE | INTRAMUSCULAR | Status: DC

## 2021-09-24 MED ORDER — LACTATED RINGERS IV BOLUS
1000.0000 mL | Freq: Once | INTRAVENOUS | Status: AC
  Administered 2021-09-24: 17:00:00 1000 mL via INTRAVENOUS

## 2021-09-24 MED ORDER — ADULT MULTIVITAMIN W/MINERALS CH
1.0000 | ORAL_TABLET | Freq: Every day | ORAL | Status: DC
  Administered 2021-09-24 – 2021-09-29 (×3): 1 via ORAL
  Filled 2021-09-24 (×3): qty 1

## 2021-09-24 MED ORDER — LORAZEPAM 2 MG/ML IJ SOLN
1.0000 mg | INTRAMUSCULAR | Status: AC | PRN
  Administered 2021-09-25: 1 mg via INTRAVENOUS
  Administered 2021-09-26: 15:00:00 2 mg via INTRAVENOUS
  Administered 2021-09-26: 08:00:00 1 mg via INTRAVENOUS
  Filled 2021-09-24 (×4): qty 1

## 2021-09-24 MED ORDER — SODIUM CHLORIDE 0.9 % IV SOLN: INTRAVENOUS | Status: DC

## 2021-09-24 MED ORDER — PANTOPRAZOLE SODIUM 40 MG IV SOLR
40.0000 mg | Freq: Once | INTRAVENOUS | Status: AC
  Administered 2021-09-24: 19:00:00 40 mg via INTRAVENOUS
  Filled 2021-09-24: qty 40

## 2021-09-24 MED ORDER — POTASSIUM CHLORIDE CRYS ER 20 MEQ PO TBCR
30.0000 meq | EXTENDED_RELEASE_TABLET | ORAL | Status: AC
  Administered 2021-09-24: 21:00:00 30 meq via ORAL
  Filled 2021-09-24 (×2): qty 2

## 2021-09-24 MED ORDER — THIAMINE HCL 100 MG PO TABS
100.0000 mg | ORAL_TABLET | Freq: Every day | ORAL | Status: DC
  Administered 2021-09-27 – 2021-09-29 (×2): 100 mg via ORAL
  Filled 2021-09-24 (×2): qty 1

## 2021-09-24 NOTE — ED Triage Notes (Signed)
Pt comes into the ED via POV c/o emesis and loss of appetite.  PT states it started a couple months ago where he would have episodes of emesis after eating.  Pt states it has become more frequent and now he is to the point of having a complete loss of appetite.  Pt states he also has had the hiccups for about 2 days.  Pt currently ambulatory to triage with even and unlabored respirations.  Pt denies having seen a GI specialist at this time.

## 2021-09-24 NOTE — ED Provider Notes (Signed)
[facility] Emergency Department Provider Note ____________________________________________   Event Date/Time   First MD Initiated Contact with Patient 09/24/21 1548     (approximate)  I have reviewed the triage vital signs and the nursing notes.  HISTORY  Chief Complaint Emesis and Loss of appetite   HPI Nicholas Fletcher is a 45 y.o. malewho presents to the ED for evaluation of chronic emesis and anorexia.  Chart review indicates patient was seen in the ED around [facility] 9 months ago for palpitations.  Noted to have hyperbilirubinemia, RUQ ultrasound with steatosis but without obstruction.  Normal CXR then.  Mild hypokalemia and prolonged QTC.  His creatinine was 0.74 with a GFR of 113 then.  Patient takes no regular prescription medications.  Does report drinking alcohol daily.  At least 4 drinks, often 1 beer and 3 or 4 shots of vodka.  Patient presents to the ED, accompanied by his wife, for evaluation of chronically progressive and worsening postprandial emesis, early satiety, weight loss and night sweats.    He reports 30 pounds of weight loss in the past few months, unintentionally.  He reports early satiety and occasional postprandial nausea and emesis, spitting up his food after just a few seconds of swallowing.  Reports his postprandial emesis have worsened over the past week and he has been unable to keep anything down.  Reports occasional upper abdominal discomfort, but minimizes this and denies significant pain. Does report poorly controlled hiccups.  Has had intermittent few days of pickups over the past few months, but over the past 1 to 2 weeks has had persistent hiccups.   Reports night sweats and weight loss.  Denies chest pain, syncope, shortness of breath or cough.  Never smoked cigarettes, occasionally smokes Black and milds or cigars.  Does report his paternal grandmother died of lung cancer in her 109s.  Wife reports that his eyes  seem yellow.  History reviewed. No pertinent past medical history.  There are no problems to display for this patient.   History reviewed. No pertinent surgical history.  Prior to Admission medications   Medication Sig Start Date End Date Taking? Authorizing Provider  naproxen (NAPROSYN) 500 MG tablet Take 1 tablet (500 mg total) by mouth 2 (two) times daily with a meal. 05/17/21   Wallis Bamberg, PA-C  oxyCODONE-acetaminophen (PERCOCET/ROXICET) 5-325 MG tablet Take 1 tablet by mouth every 4 (four) hours as needed for severe pain. 07/17/19   Hyman Hopes, Margaux, PA-C  tiZANidine (ZANAFLEX) 4 MG tablet Take 1 tablet (4 mg total) by mouth every 8 (eight) hours as needed. 05/17/21   Wallis Bamberg, PA-C    Allergies Patient has no known allergies.  Family History  Family history unknown: Yes    Social History Social History   Tobacco Use   Smoking status: Light Smoker    Types: Cigars   Smokeless tobacco: Never  Substance Use Topics   Alcohol use: Yes   Drug use: Not Currently    Review of Systems  Constitutional: Positive for night sweats and unintentional weight loss. Eyes: No visual changes. ENT: No sore throat. Cardiovascular: Denies chest pain. Respiratory: Denies shortness of breath. Gastrointestinal: Positive for postprandial emesis  No diarrhea.  No constipation. Genitourinary: Negative for dysuria. Musculoskeletal: Negative for back pain. Skin: Negative for rash. Neurological: Negative for headaches, focal weakness or numbness.  ____________________________________________   PHYSICAL EXAM:  VITAL SIGNS: Vitals:   09/24/21 1637 09/24/21 1730  BP: (!) 149/72 129/78  Pulse:  96  Resp: 16 18  Temp:    SpO2: 97% 98%    Constitutional: Alert and oriented. Well appearing and in no acute distress.  Hiccuping throughout our conversation. Eyes:  PERRL. EOMI. scleral icterus is present. Head: Atraumatic. Nose: No congestion/rhinnorhea. Mouth/Throat: Mucous membranes  are moist.  Oropharynx non-erythematous. Neck: No stridor. No cervical spine tenderness to palpation. Cardiovascular: Normal rate, regular rhythm. Grossly normal heart sounds.  Good peripheral circulation. Respiratory: Normal respiratory effort.  No retractions. Lungs CTAB. Gastrointestinal: Soft , nondistended,. No CVA tenderness. Minimal epigastric and RUQ tenderness without peritoneal features.  Lower abdomen is benign. Musculoskeletal: No lower extremity tenderness nor edema.  No joint effusions. No signs of acute trauma. Neurologic:  Normal speech and language. No gross focal neurologic deficits are appreciated. No gait instability noted. Skin:  Skin is warm, dry and intact. No rash noted. Psychiatric: Mood and affect are normal. Speech and behavior are normal. ____________________________________________   LABS (all labs ordered are listed, but only abnormal results are displayed)  Labs Reviewed  BASIC METABOLIC PANEL - Abnormal; Notable for the following components:      Result Value   Sodium 134 (*)    Potassium 3.4 (*)    Chloride 93 (*)    Glucose, Bld 164 (*)    Creatinine, Ser 1.80 (*)    GFR, Estimated 46 (*)    Anion gap 16 (*)    All other components within normal limits  CBC - Abnormal; Notable for the following components:   MCV 101.6 (*)    MCH 36.3 (*)    All other components within normal limits  HEPATIC FUNCTION PANEL - Abnormal; Notable for the following components:   AST 240 (*)    ALT 72 (*)    Total Bilirubin 3.9 (*)    Bilirubin, Direct 1.0 (*)    Indirect Bilirubin 2.9 (*)    All other components within normal limits  URINALYSIS, COMPLETE (UACMP) WITH MICROSCOPIC - Abnormal; Notable for the following components:   Color, Urine AMBER (*)    Specific Gravity, Urine >1.030 (*)    Glucose, UA 100 (*)    Bilirubin Urine LARGE (*)    Ketones, ur 40 (*)    Protein, ur 100 (*)    Nitrite POSITIVE (*)    Bacteria, UA RARE (*)    All other components  within normal limits  RESP PANEL BY RT-PCR (FLU A&B, COVID) ARPGX2  LIPASE, BLOOD  MAGNESIUM  PROTIME-INR  TROPONIN I (HIGH SENSITIVITY)  TROPONIN I (HIGH SENSITIVITY)   ____________________________________________  12 Lead EKG  Sinus rhythm, rate of 98 bpm.  Normal axis.  Prolonged QTC at 579 ms.  No STEMI.  Nonspecific ST changes inferiorly.  ____________________________________________  RADIOLOGY  ED MD interpretation: 2 view CXR reviewed by me with LLL pulmonary nodule, no infiltration or PTX.  Official radiology report(s): DG Chest 2 View  Result Date: 09/24/2021 CLINICAL DATA:  Shortness of breath. Upper abdominal pain. Emesis and loss of appetite. EXAM: CHEST - 2 VIEW COMPARISON:  03/21/2005 FINDINGS: 9 mm density the left lung base at the intersection of the left eighth and tenth ribs, cannot exclude pulmonary nodule. Not well corroborated on the lateral projection. The lungs appear otherwise clear. Cardiac and mediastinal margins appear normal. No blunting of the costophrenic angles. IMPRESSION: 1. 9 mm density projects over the left lung base. Cannot exclude pulmonary nodule. Chest CT recommended for further characterization. Electronically Signed   By: Van Clines M.D.   On: 09/24/2021  15:35   CT CHEST ABDOMEN PELVIS W CONTRAST  Result Date: 09/24/2021 CLINICAL DATA:  Evaluate left lower lobe nodule and hepatic biliary pathology. Weight loss, progressive hiccups and postprandial emesis. EXAM: CT CHEST, ABDOMEN, AND PELVIS WITH CONTRAST TECHNIQUE: Multidetector CT imaging of the chest, abdomen and pelvis was performed following the standard protocol during bolus administration of intravenous contrast. CONTRAST:  112mL OMNIPAQUE IOHEXOL 300 MG/ML  SOLN COMPARISON:  None. FINDINGS: CT CHEST FINDINGS Cardiovascular: No significant vascular findings. Normal heart size. No pericardial effusion. Mediastinum/Nodes: No evidence of lymphadenopathy. Diffusely thickened esophagus.  Lungs/Pleura: Lungs are clear. No pleural effusion or pneumothorax. Musculoskeletal: No chest wall mass or suspicious bone lesions identified. CT ABDOMEN PELVIS FINDINGS Hepatobiliary: Marked hepatic steatosis.  Normal gallbladder. Pancreas: Unremarkable. No pancreatic ductal dilatation or surrounding inflammatory changes. Spleen: Normal in size without focal abnormality. Adrenals/Urinary Tract: Adrenal glands are unremarkable. Kidneys are normal, without renal calculi, focal lesion, or hydronephrosis. Bladder is unremarkable. Stomach/Bowel: Diffuse mucosal thickening of the stomach. Normal small bowel. Normal appendix. Scattered colonic diverticulosis, particularly prominent in the cecum. Circumferential thickening of the base of the cecum. Vascular/Lymphatic: No significant vascular findings are present. No enlarged abdominal or pelvic lymph nodes. Reproductive: Prostate is unremarkable. Other: No abdominal wall hernia or abnormality. No abdominopelvic ascites. Musculoskeletal: No acute or significant osseous findings. IMPRESSION: 1. Marked hepatic steatosis. 2. Circumferential thickening of the base of the cecum. This may represent an area of focal colitis, especially given the presence of cecal diverticulosis, however underlying malignancy cannot be excluded. 3. Diffuse mucosal thickening of the stomach. 4. Scattered colonic diverticulosis, particularly prominent in the cecum. 5. No evidence of pulmonary nodules. 6. Correlation with upper GI endoscopy and colonoscopy may be considered. Electronically Signed   By: Fidela Salisbury M.D.   On: 09/24/2021 18:16    ____________________________________________   PROCEDURES and INTERVENTIONS  Procedure(s) performed (including Critical Care):  .1-3 Lead EKG Interpretation Performed by: Vladimir Crofts, MD Authorized by: Vladimir Crofts, MD     Interpretation: normal     ECG rate:  90   ECG rate assessment: normal     Rhythm: sinus rhythm     Ectopy: none      Conduction: normal    Medications  pantoprazole (PROTONIX) injection 40 mg (has no administration in time range)  thiamine (B-1) injection 100 mg (has no administration in time range)  lactated ringers bolus 1,000 mL (1,000 mLs Intravenous New Bag/Given 09/24/21 1724)  metoCLOPramide (REGLAN) injection 10 mg (10 mg Intravenous Given 09/24/21 1649)  iohexol (OMNIPAQUE) 300 MG/ML solution 100 mL (100 mLs Intravenous Contrast Given 09/24/21 1652)    ____________________________________________   MDM / ED COURSE   45 year old male presents to the ED with acute on chronic symptoms of early satiety, emesis in the setting of regular ethanol intake, with evidence of AKI and alcoholic hepatitis requiring observation admission.  Normal vitals on room air.  Scleral icterus is noted.  Minimal RUQ tenderness and otherwise benign examination.  Blood work with hepatobiliary dysfunction and AKI.  Urine confirms dehydration with ketonuria.  No significant metabolic acidosis to suggest AKA or DKA.  Provided Reglan, with improvement of his hiccups.  CXR without evidence of aspiration, but does demonstrate possible LLL nodule.  Due to his abdominal symptoms, early satiety, hiccups and this nodule, end up just a CT of his chest abdomen and pelvis and this is largely reassuring.  No evidence of pulmonary nodules or cardiopulmonary pathology on this imaging.  Possibly an area of colitis,  but he denies any significant diarrhea.  Hepatic steatosis without clear evidence of obstruction.  We will follow-up with RUQ ultrasound.  Discussed the case with hospitalist who agrees for observation admission in the setting of his AKI and alcoholic hepatitis.  Clinical Course as of Oct 24, 2021 1856  2021-10-24  1623 Paternal grandma died at 52 from lung cancer [DS]  1832 Reassessed and discussed overall work-up including CT imaging.  We discussed AKI and alcoholic hepatitis.  We discussed the possibility of rehydration in the ER  and outpatient follow-up with GI and PCP, versus inpatient observation admission to facilitate more rapid GI evaluation and monitoring of his renal dysfunction.  His wife urged him to stay and he is agreeable.  We will discuss with hospitalist. [DS]    Clinical Course User Index [DS] Vladimir Crofts, MD    ____________________________________________   FINAL CLINICAL IMPRESSION(S) / ED DIAGNOSES  Final diagnoses:  Lung nodule  RUQ abdominal pain  AKI (acute kidney injury) ([place])  Alcoholic hepatitis without ascites  Prolonged Q-T interval on ECG     ED Discharge Orders     None        Neria Procter   Note:  This document was prepared using Dragon voice recognition software and may include unintentional dictation errors.    Vladimir Crofts, MD 10/24/21 1901

## 2021-09-24 NOTE — Plan of Care (Signed)
GI asked to make NPO @ 8am tomorrow and hold off on lovenox. GI will see in am, talked with Eather Colas.

## 2021-09-24 NOTE — H&P (Signed)
History and Physical    Nicholas Fletcher [id number] DOB: Feb 26, 1976 DOA: 09/24/2021  PCP: Patient, No Pcp Per (Inactive)  ?health dept Patient coming from: home Chief Complaint: Emesis and loss of appetite  HPI: Nicholas Fletcher is a 45 year old male with a history of alcohol abuse and presents to ED with persistent, chronic worsening emesis and loss of appetite.   Last couple months has been progressively worsening with emesis.  Soemtimes food stuck in esophagus and quickly regurgitates and then sometimes feels it gets to stomach and can be bilious.  Lots of vomitting to the point of he associates throat ulceration and dysphagia.  He works as a Chief Strategy Officer.  At times he can eat half a hamburger, lost 30 poudns over the past couple months.  Doesn't feel that etoh is a dependency issue at this time, does it out of habit to unwind. Very interested in stopping.  3-4 drinks a day typically,  beer then 3-4 shots of vodka typically on daily basis.  No marijuana, never IVDU.  Last out of coutnry was about 10 years ago, says HCV was tested at [facility] 5-6 mos ago.  "Chart review indicates patient was seen in the ED around [facility] 9 months ago for palpitations.  Noted to have hyperbilirubinemia, RUQ ultrasound with steatosis but without obstruction.  Normal CXR then.  Mild hypokalemia and prolonged QTC.  His creatinine was 0.74 with a GFR of 113 then."     121/79, 98.6 temperature, 97 HR, 96% on room air, RR 19, WBC 10.3, Hgb 16.3, PLT 290, MCV 101.6 NA 134, K3.4, chloride 93, serum creatinine 1.80, glucose 164, AST elevated to 240, ALT elevated to 72, total bilirubin 3.9, indirect predominant to 2.9 t INR 0.9, troponin 13, magnesium 1.7, lipase 31 CXR shows concern for a pulmonary nodule which is not seen on the CT scan but recommended CT evaluation CT chest abdomen pelvis - Marked hepatic steatosis, concern for focal colitis at the base of the cecum Awaiting RUQ [place] s/p 1L LR in ED and protonix IV  and thiamine IV  Review of Systems: As per HPI otherwise 10 point review of systems negative.  Other pertinents as below:  General - denies f/c but has night sweats HEENT - denies any new headaches or visual changes, throat ulcerations and congestion, no sick contacts, has had some covid vaccinations Cardio - denies palpitations Resp - denies any new chest pain or sob or palpitations GI - has dysphagia, denies hematochezia or melena GU - denies any new urinary complaints MSK - denies new joint or back pain Skin - denies new rashes, eyes are jaundiced. Neuro - feels generalized weak, but no focal numbness or weakness Psych - Feels anxious at times, denies depression   History reviewed. No pertinent past medical history.  History reviewed. No pertinent surgical history.   reports that he has been smoking cigars. He has never used smokeless tobacco. He reports current alcohol use. He reports that he does not currently use drugs.  No Known Allergies  Family History  Family history unknown: Yes   Mother w/ FH of cirrhosis, etoh abuse  Prior to Admission medications   Medication Sig Start Date End Date Taking? Authorizing Provider  naproxen (NAPROSYN) 500 MG tablet Take 1 tablet (500 mg total) by mouth 2 (two) times daily with a meal. Patient not taking: Reported on 09/24/2021 05/17/21   Jaynee Eagles, PA-C  oxyCODONE-acetaminophen (PERCOCET/ROXICET) 5-325 MG tablet Take 1 tablet by mouth every 4 (four)  hours as needed for severe pain. Patient not taking: Reported on 09/24/2021 07/17/19   Eustaquio Maize, PA-C  tiZANidine (ZANAFLEX) 4 MG tablet Take 1 tablet (4 mg total) by mouth every 8 (eight) hours as needed. 05/17/21   Jaynee Eagles, PA-C    Physical Exam: Vitals:   09/24/21 1419 09/24/21 1637 09/24/21 1730 09/24/21 1909  BP:  (!) 149/72 129/78 (!) 141/80  Pulse:   96 100  Resp:  16 18 20   Temp:      TempSrc:      SpO2:  97% 98% 99%  Weight: 79.4 kg     Height: 6' (1.829 m)        Constitutional: NAD, comfortable, nontoxic appearing, glossed over eyes Eyes: pupils equal and reactive to light, jaundiced eyes ENMT: MMM, throat without exudates or erythema Neck: normal, supple, no masses, no thyromegaly noted Respiratory: CTAB, nwob Cardiovascular: rrr w/o mrg, warm extremities Abdomen: NBS, NT,   Musculoskeletal: moving all 4 extremities, strength grossly intact 5/5 in the UE and LE's, DTR's   Skin: no rashes, lesions, ulcers. No induration Neurologic: CN 2-12 grossly intact. Sensation intact Psychiatric: AO appearing, mentation appropriate  Labs on Admission: I have personally reviewed following labs and imaging studies  CBC: Recent Labs  Lab 09/24/21 1422  WBC 10.3  HGB 16.3  HCT 45.6  MCV 101.6*  PLT [id number]   Basic Metabolic Panel: Recent Labs  Lab 09/24/21 1422  NA 134*  K 3.4*  CL 93*  CO2 25  GLUCOSE 164*  BUN 13  CREATININE 1.80*  CALCIUM 9.0  MG 1.7   GFR: Estimated Creatinine Clearance: 55.7 mL/min (A) (by C-G formula based on SCr of 1.8 mg/dL (H)). Liver Function Tests: Recent Labs  Lab 09/24/21 1422  AST 240*  ALT 72*  ALKPHOS 92  BILITOT 3.9*  PROT 6.7  ALBUMIN 4.0   Recent Labs  Lab 09/24/21 1422  LIPASE 31   No results for input(s): AMMONIA in the last 168 hours. Coagulation Profile: Recent Labs  Lab 09/24/21 1721  INR 0.9   Cardiac Enzymes: No results for input(s): CKTOTAL, CKMB, CKMBINDEX, TROPONINI in the last 168 hours. BNP (last 3 results) No results for input(s): PROBNP in the last 8760 hours. HbA1C: No results for input(s): HGBA1C in the last 72 hours. CBG: No results for input(s): GLUCAP in the last 168 hours. Lipid Profile: No results for input(s): CHOL, HDL, LDLCALC, TRIG, CHOLHDL, LDLDIRECT in the last 72 hours. Thyroid Function Tests: No results for input(s): TSH, T4TOTAL, FREET4, T3FREE, THYROIDAB in the last 72 hours. Anemia Panel: No results for input(s): VITAMINB12, FOLATE, FERRITIN,  TIBC, IRON, RETICCTPCT in the last 72 hours. Urine analysis:    Component Value Date/Time   COLORURINE AMBER (A) 09/24/2021 1638   APPEARANCEUR CLEAR 09/24/2021 1638   LABSPEC >1.030 (H) 09/24/2021 1638   PHURINE 5.5 09/24/2021 1638   GLUCOSEU 100 (A) 09/24/2021 1638   HGBUR NEGATIVE 09/24/2021 1638   BILIRUBINUR LARGE (A) 09/24/2021 1638   KETONESUR 40 (A) 09/24/2021 1638   PROTEINUR 100 (A) 09/24/2021 1638   NITRITE POSITIVE (A) 09/24/2021 1638   LEUKOCYTESUR NEGATIVE 09/24/2021 1638    Radiological Exams on Admission: DG Chest 2 View  Result Date: 09/24/2021 CLINICAL DATA:  Shortness of breath. Upper abdominal pain. Emesis and loss of appetite. EXAM: CHEST - 2 VIEW COMPARISON:  03/21/2005 FINDINGS: 9 mm density the left lung base at the intersection of the left eighth and tenth ribs, cannot exclude pulmonary nodule. Not well  corroborated on the lateral projection. The lungs appear otherwise clear. Cardiac and mediastinal margins appear normal. No blunting of the costophrenic angles. IMPRESSION: 1. 9 mm density projects over the left lung base. Cannot exclude pulmonary nodule. Chest CT recommended for further characterization. Electronically Signed   By: Van Clines M.D.   On: 09/24/2021 15:35   CT CHEST ABDOMEN PELVIS W CONTRAST  Result Date: 09/24/2021 CLINICAL DATA:  Evaluate left lower lobe nodule and hepatic biliary pathology. Weight loss, progressive hiccups and postprandial emesis. EXAM: CT CHEST, ABDOMEN, AND PELVIS WITH CONTRAST TECHNIQUE: Multidetector CT imaging of the chest, abdomen and pelvis was performed following the standard protocol during bolus administration of intravenous contrast. CONTRAST:  160mL OMNIPAQUE IOHEXOL 300 MG/ML  SOLN COMPARISON:  None. FINDINGS: CT CHEST FINDINGS Cardiovascular: No significant vascular findings. Normal heart size. No pericardial effusion. Mediastinum/Nodes: No evidence of lymphadenopathy. Diffusely thickened esophagus.  Lungs/Pleura: Lungs are clear. No pleural effusion or pneumothorax. Musculoskeletal: No chest wall mass or suspicious bone lesions identified. CT ABDOMEN PELVIS FINDINGS Hepatobiliary: Marked hepatic steatosis.  Normal gallbladder. Pancreas: Unremarkable. No pancreatic ductal dilatation or surrounding inflammatory changes. Spleen: Normal in size without focal abnormality. Adrenals/Urinary Tract: Adrenal glands are unremarkable. Kidneys are normal, without renal calculi, focal lesion, or hydronephrosis. Bladder is unremarkable. Stomach/Bowel: Diffuse mucosal thickening of the stomach. Normal small bowel. Normal appendix. Scattered colonic diverticulosis, particularly prominent in the cecum. Circumferential thickening of the base of the cecum. Vascular/Lymphatic: No significant vascular findings are present. No enlarged abdominal or pelvic lymph nodes. Reproductive: Prostate is unremarkable. Other: No abdominal wall hernia or abnormality. No abdominopelvic ascites. Musculoskeletal: No acute or significant osseous findings. IMPRESSION: 1. Marked hepatic steatosis. 2. Circumferential thickening of the base of the cecum. This may represent an area of focal colitis, especially given the presence of cecal diverticulosis, however underlying malignancy cannot be excluded. 3. Diffuse mucosal thickening of the stomach. 4. Scattered colonic diverticulosis, particularly prominent in the cecum. 5. No evidence of pulmonary nodules. 6. Correlation with upper GI endoscopy and colonoscopy may be considered. Electronically Signed   By: Fidela Salisbury M.D.   On: 09/24/2021 18:16   US ABDOMEN LIMITED RUQ (LIVER/GB)  Result Date: 09/24/2021 CLINICAL DATA:  Right upper quadrant pain for 2 days EXAM: ULTRASOUND ABDOMEN LIMITED RIGHT UPPER QUADRANT COMPARISON:  CT from earlier in the same day. FINDINGS: Gallbladder: Gallbladder is well distended with dependent sludge. No gallstones are seen. No wall thickening or pericholecystic  fluid is noted. Common bile duct: Diameter: 3 mm. Liver: Diffusely increased in echogenicity consistent with fatty infiltration. No focal mass is noted. Portal vein is patent on color Doppler imaging with normal direction of blood flow towards the liver. Other: None. IMPRESSION: Fatty liver. Gallbladder sludge without complicating factors. Electronically Signed   By: Inez Catalina M.D.   On: 09/24/2021 19:56    EKG: Independently reviewed. Prolonged QTC  Assessment/Plan Principal Problem:   Hepatitis  suspect alcoholic hepatitis but I trust that he only drinks 4/day. And wonder if his FH of cirrhosis in mother who was an alcoholic has contributed. He has ulcers in his throat which he attributes to vomiting a lot during the week.  sometimes impacts in esophagus and others goes down to stomach then comes back up.  Some concern for CMV  given throat and "colitis"or HIV.  Wonder if something else is going on here. --HIV testing. Hep acute panel for now --other work up defer to GI, appreciate their help. --wonder if EGD>colonoscopy is warranted  --  npo @[place] , did lovenox ppx though --N/V - prolonged qtc, ativan with least amoutn of qtc prolongation, hoping PPI will help the most. --discriminant score 3.9 so won't do prednisone  hypokalemia - replace w 30mg  x2, will keep on telemetry for 12 hrs  ETOH abuse - no history of seizures or significant withdrawals but on ciwa and vitamins, team to help with resources  Unintentional weight loss, early satiety wonder if a lot from gastritis as seen on CT scan --PPI bid, trialed GI cocktail in the ED --he also mentions night sweats but not drenching.  AKI - probably will improve with NaCl NS 143mL/hr for 12hrs  Circumferential thickening of the base of the cecum which may represent focal colitis-doubtful clinical significance, CTM   Patient and/or Family completely agreed with the plan, expressed understanding and I answered all questions.  DVT  prophylaxis: Lovenox SQ Code Status: Full code Family Communication: wife ?veronica Lorson  Disposition Plan: home when able, hopefully has therapies and resources, consider psych referral for consideration of alcohol medication   Consults called: GI Admission status: observation for now because not able to tolerate PO intake currently with hepatitis and painless jaundice.  A total of 75 minutes utilized during this admission.  DO [facility]   If 7PM-7AM, please contact night-coverage [email or web address] Password [email or web address]  09/24/2021, 8:02 PM

## 2021-09-24 NOTE — ED Notes (Signed)
CT at bedside 

## 2021-09-25 LAB — COMPREHENSIVE METABOLIC PANEL
ALT: 76 U/L — ABNORMAL HIGH (ref 0–44)
AST: 257 U/L — ABNORMAL HIGH (ref 15–41)
Albumin: 3.6 g/dL (ref 3.5–5.0)
Alkaline Phosphatase: 80 U/L (ref 38–126)
Anion gap: 8 (ref 5–15)
BUN: 18 mg/dL (ref 6–20)
CO2: 31 mmol/L (ref 22–32)
Calcium: 8.8 mg/dL — ABNORMAL LOW (ref 8.9–10.3)
Chloride: 99 mmol/L (ref 98–111)
Creatinine, Ser: 1.55 mg/dL — ABNORMAL HIGH (ref 0.61–1.24)
GFR, Estimated: 55 mL/min — ABNORMAL LOW (ref >60)
Glucose, Bld: 88 mg/dL (ref 70–99)
Potassium: 3.6 mmol/L (ref 3.5–5.1)
Sodium: 138 mmol/L (ref 135–145)
Total Bilirubin: 3.8 mg/dL — ABNORMAL HIGH (ref 0.3–1.2)
Total Protein: 6.4 g/dL — ABNORMAL LOW (ref 6.5–8.1)

## 2021-09-25 LAB — PROTIME-INR
INR: 0.9 (ref 0.8–1.2)
Prothrombin Time: 12.1 seconds (ref 11.4–15.2)

## 2021-09-25 LAB — CBC
HCT: 43.1 % (ref 39.0–52.0)
Hemoglobin: 15.2 g/dL (ref 13.0–17.0)
MCH: 36.8 pg — ABNORMAL HIGH (ref 26.0–34.0)
MCHC: 35.3 g/dL (ref 30.0–36.0)
MCV: 104.4 fL — ABNORMAL HIGH (ref 80.0–100.0)
Platelets: 199 10*3/uL (ref 150–400)
RBC: 4.13 MIL/uL — ABNORMAL LOW (ref 4.22–5.81)
RDW: 11.9 % (ref 11.5–15.5)
WBC: 6.1 10*3/uL (ref 4.0–10.5)
nRBC: 0 % (ref 0.0–0.2)

## 2021-09-25 LAB — HIV ANTIBODY (ROUTINE TESTING W REFLEX): HIV Screen 4th Generation wRfx: NONREACTIVE

## 2021-09-25 LAB — HEPATITIS PANEL, ACUTE
HCV Ab: NONREACTIVE
Hep A IgM: NONREACTIVE
Hep B C IgM: NONREACTIVE
Hepatitis B Surface Ag: NONREACTIVE

## 2021-09-25 MED ORDER — GABAPENTIN 100 MG PO CAPS
100.0000 mg | ORAL_CAPSULE | Freq: Once | ORAL | Status: AC
  Administered 2021-09-25: 100 mg via ORAL
  Filled 2021-09-25: qty 1

## 2021-09-25 MED ORDER — MAGNESIUM SULFATE 4 GM/100ML IV SOLN
4.0000 g | Freq: Once | INTRAVENOUS | Status: AC
  Administered 2021-09-25: 4 g via INTRAVENOUS
  Filled 2021-09-25: qty 100

## 2021-09-25 MED ORDER — POTASSIUM CHLORIDE CRYS ER 20 MEQ PO TBCR
40.0000 meq | EXTENDED_RELEASE_TABLET | Freq: Once | ORAL | Status: AC
  Administered 2021-09-25: 40 meq via ORAL
  Filled 2021-09-25: qty 2

## 2021-09-25 MED ORDER — PEG 3350-KCL-NA BICARB-NACL 420 G PO SOLR
4000.0000 mL | Freq: Once | ORAL | Status: AC
  Administered 2021-09-25: 19:00:00 4000 mL via ORAL
  Filled 2021-09-25: qty 4000

## 2021-09-25 MED ORDER — SODIUM CHLORIDE 0.9 % IV SOLN: INTRAVENOUS | Status: DC

## 2021-09-25 MED ORDER — POTASSIUM PHOSPHATES 15 MMOLE/5ML IV SOLN
30.0000 mmol | Freq: Once | INTRAVENOUS | Status: AC
  Administered 2021-09-25: 30 mmol via INTRAVENOUS
  Filled 2021-09-25: qty 10

## 2021-09-25 NOTE — Consult Note (Deleted)
Wyline Mood , MD [address], [address], [place], [place], [address] [address], [place], [place], [address] Phone: [phone]  Fax: [phone]  Consultation  Referring Provider:    Dr. Pola Corn Primary Care Physician:  Patient, No Pcp Per (Inactive) Primary Gastroenterologist: None         Reason for Consultation:     Nausea vomiting  Date of Admission:  09/24/2021 Date of Consultation:  09/25/2021         HPI:   Nicholas Fletcher is a 45 y.o. male presented to the emergency room with vomiting and loss of appetite.  Ongoing for the past couple of months.  30 pounds for the past few months.  History of excess alcohol intake up to 340 drinks a day   09/24/2021: CT chest abdomen pelvis shows marked hepatic steatosis circumferential thickening of the basal cecum area may represent a focal area of colitis underlying malignancy cannot be excluded.  Diffuse wall thickening of the stomach.  09/24/2021: Right upper quadrant ultrasound fatty liver with gallbladder sludge  09/25/2021 BMP demonstrates AST of 257, ALT 76 total bilirubin 3.8 creatinine 1.55, CBC 1215.1 MCV 104.4, INR 0.9  History reviewed. No pertinent past medical history.  History reviewed. No pertinent surgical history.  Prior to Admission medications   Medication Sig Start Date End Date Taking? Authorizing Provider  naproxen (NAPROSYN) 500 MG tablet Take 1 tablet (500 mg total) by mouth 2 (two) times daily with a meal. Patient not taking: Reported on 09/24/2021 05/17/21   Wallis Bamberg, PA-C  oxyCODONE-acetaminophen (PERCOCET/ROXICET) 5-325 MG tablet Take 1 tablet by mouth every 4 (four) hours as needed for severe pain. Patient not taking: Reported on 09/24/2021 07/17/19   Tanda Rockers, PA-C  tiZANidine (ZANAFLEX) 4 MG tablet Take 1 tablet (4 mg total) by mouth every 8 (eight) hours as needed. 05/17/21   Wallis Bamberg, PA-C    Family History  Family history unknown: Yes     Social History   Tobacco Use    Smoking status: Light Smoker    Types: Cigars   Smokeless tobacco: Never  Substance Use Topics   Alcohol use: Yes   Drug use: Not Currently    Allergies as of 09/24/2021   (No Known Allergies)    Review of Systems:    All systems reviewed and negative except where noted in HPI.   Physical Exam:  Vital signs in last 24 hours: Temp:  [98.6 F (37 C)] 98.6 F (37 C) (12/04 1418) Pulse Rate:  [72-100] 72 (12/05 0640) Resp:  [14-20] 16 (12/05 0600) BP: (120-153)/(70-92) 126/83 (12/05 0640) SpO2:  [96 %-100 %] 98 % (12/05 0600) Weight:  [79.4 kg] 79.4 kg (12/04 1419)   General:   Pleasant, cooperative in NAD Head:  Normocephalic and atraumatic. Eyes:   No icterus.   Conjunctiva pink. PERRLA. Ears:  Normal auditory acuity. Neck:  Supple; no masses or thyroidomegaly Lungs: Respirations even and unlabored. Lungs clear to auscultation bilaterally.   No wheezes, crackles, or rhonchi.  Heart:  Regular rate and rhythm;  Without murmur, clicks, rubs or gallops Abdomen:  Soft, nondistended, nontender. Normal bowel sounds. No appreciable masses or hepatomegaly.  No rebound or guarding.  Neurologic:  Alert and oriented x3;  grossly normal neurologically. Skin:  Intact without significant lesions or rashes. Cervical Nodes:  No significant cervical adenopathy. Psych:  Alert and cooperative. Normal affect.  LAB RESULTS: Recent Labs    09/24/21 1422 09/25/21 0737  WBC 10.3  6.1  HGB 16.3 15.2  HCT 45.6 43.1  PLT 290 199   BMET Recent Labs    09/24/21 1422 09/25/21 0737  NA 134* 138  K 3.4* 3.6  CL 93* 99  CO2 25 31  GLUCOSE 164* 88  BUN 13 18  CREATININE 1.80* 1.55*  CALCIUM 9.0 8.8*   LFT Recent Labs    09/24/21 1422 09/25/21 0737  PROT 6.7 6.4*  ALBUMIN 4.0 3.6  AST 240* 257*  ALT 72* 76*  ALKPHOS 92 80  BILITOT 3.9* 3.8*  BILIDIR 1.0*  --   IBILI 2.9*  --    PT/INR Recent Labs    09/24/21 1721 09/25/21 0737  LABPROT 12.0 12.1  INR 0.9 0.9     STUDIES: DG Chest 2 View  Result Date: 09/24/2021 CLINICAL DATA:  Shortness of breath. Upper abdominal pain. Emesis and loss of appetite. EXAM: CHEST - 2 VIEW COMPARISON:  03/21/2005 FINDINGS: 9 mm density the left lung base at the intersection of the left eighth and tenth ribs, cannot exclude pulmonary nodule. Not well corroborated on the lateral projection. The lungs appear otherwise clear. Cardiac and mediastinal margins appear normal. No blunting of the costophrenic angles. IMPRESSION: 1. 9 mm density projects over the left lung base. Cannot exclude pulmonary nodule. Chest CT recommended for further characterization. Electronically Signed   By: Van Clines M.D.   On: 09/24/2021 15:35   CT CHEST ABDOMEN PELVIS W CONTRAST  Result Date: 09/24/2021 CLINICAL DATA:  Evaluate left lower lobe nodule and hepatic biliary pathology. Weight loss, progressive hiccups and postprandial emesis. EXAM: CT CHEST, ABDOMEN, AND PELVIS WITH CONTRAST TECHNIQUE: Multidetector CT imaging of the chest, abdomen and pelvis was performed following the standard protocol during bolus administration of intravenous contrast. CONTRAST:  193mL OMNIPAQUE IOHEXOL 300 MG/ML  SOLN COMPARISON:  None. FINDINGS: CT CHEST FINDINGS Cardiovascular: No significant vascular findings. Normal heart size. No pericardial effusion. Mediastinum/Nodes: No evidence of lymphadenopathy. Diffusely thickened esophagus. Lungs/Pleura: Lungs are clear. No pleural effusion or pneumothorax. Musculoskeletal: No chest wall mass or suspicious bone lesions identified. CT ABDOMEN PELVIS FINDINGS Hepatobiliary: Marked hepatic steatosis.  Normal gallbladder. Pancreas: Unremarkable. No pancreatic ductal dilatation or surrounding inflammatory changes. Spleen: Normal in size without focal abnormality. Adrenals/Urinary Tract: Adrenal glands are unremarkable. Kidneys are normal, without renal calculi, focal lesion, or hydronephrosis. Bladder is unremarkable.  Stomach/Bowel: Diffuse mucosal thickening of the stomach. Normal small bowel. Normal appendix. Scattered colonic diverticulosis, particularly prominent in the cecum. Circumferential thickening of the base of the cecum. Vascular/Lymphatic: No significant vascular findings are present. No enlarged abdominal or pelvic lymph nodes. Reproductive: Prostate is unremarkable. Other: No abdominal wall hernia or abnormality. No abdominopelvic ascites. Musculoskeletal: No acute or significant osseous findings. IMPRESSION: 1. Marked hepatic steatosis. 2. Circumferential thickening of the base of the cecum. This may represent an area of focal colitis, especially given the presence of cecal diverticulosis, however underlying malignancy cannot be excluded. 3. Diffuse mucosal thickening of the stomach. 4. Scattered colonic diverticulosis, particularly prominent in the cecum. 5. No evidence of pulmonary nodules. 6. Correlation with upper GI endoscopy and colonoscopy may be considered. Electronically Signed   By: Fidela Salisbury M.D.   On: 09/24/2021 18:16   US ABDOMEN LIMITED RUQ (LIVER/GB)  Result Date: 09/24/2021 CLINICAL DATA:  Right upper quadrant pain for 2 days EXAM: ULTRASOUND ABDOMEN LIMITED RIGHT UPPER QUADRANT COMPARISON:  CT from earlier in the same day. FINDINGS: Gallbladder: Gallbladder is well distended with dependent sludge. No gallstones are seen. No  wall thickening or pericholecystic fluid is noted. Common bile duct: Diameter: 3 mm. Liver: Diffusely increased in echogenicity consistent with fatty infiltration. No focal mass is noted. Portal vein is patent on color Doppler imaging with normal direction of blood flow towards the liver. Other: None. IMPRESSION: Fatty liver. Gallbladder sludge without complicating factors. Electronically Signed   By: Inez Catalina M.D.   On: 09/24/2021 19:56      Impression / Plan:   Nicholas Fletcher is a 45 y.o. y/o male with admitted to hospital with vomiting, unintentional  weight loss.  CT scan of the abdomen shows abnormality of the cecum which can often represent underlying neoplasm.  Patient has developed AKI and has known to have abnormal LFTs.  This could be due to excess alcohol use.  Plan 1.  Abnormal LFTs full autoimmune hepatitis and viral hepatitis B will be ordered, the results will not be available till the patient is seen as an outpatient which will be followed up at that point of time 2.  Will require EGD and colonoscopy for abnormal weight loss we can plan for tomorrow.  CT scan of the abdomen shows thickening in the cecum which could explain the weight loss if it is a neoplasm. 3.  Macrocytosis please consider checking B12 folate levels continue on IV thiamine.  CIWA protocol strongly suggest to stop all alcohol use no NSAID use   Thank you for involving me in the care of this patient.      LOS: 0 days   Jonathon Bellows, MD  09/25/2021, 10:00 AM

## 2021-09-25 NOTE — Consult Note (Signed)
Consultation  Referring Provider:     Hospitalist Admit date: 09/24/2021 Consult date: 09/25/2021         Reason for Consultation:     Alcohol Hepatitis         HPI:   Nicholas Fletcher is a 45 y.o. gentleman with history of alcohol abuse who for the past 7 months has had decreased PO intake which culminated this past weekend with inability to tolerate any PO intake including liquids. Presented to the ED for evaluation where he was found to have abnormal liver enzymes likely secondary to alcoholic hepatitis. He drinks several shots and beer 5-6 times a week. Mother had cirrhosis from alcohol use. CT imaging in the ED showed some gastric thickening and cecal thickening. Patient has lost 30 pounds over the past 7 months. He has never had an EGD or colonoscopy. No blood thinners. No abdominal surgeries.   History reviewed. No pertinent past medical history.  History reviewed. No pertinent surgical history.  Family History  Family history unknown: Yes   Alcohol Cirrhosis  Social History   Tobacco Use   Smoking status: Light Smoker    Types: Cigars   Smokeless tobacco: Never  Substance Use Topics   Alcohol use: Yes   Drug use: Not Currently    Prior to Admission medications   Medication Sig Start Date End Date Taking? Authorizing Provider  naproxen (NAPROSYN) 500 MG tablet Take 1 tablet (500 mg total) by mouth 2 (two) times daily with a meal. Patient not taking: Reported on 09/24/2021 05/17/21   Jaynee Eagles, PA-C  oxyCODONE-acetaminophen (PERCOCET/ROXICET) 5-325 MG tablet Take 1 tablet by mouth every 4 (four) hours as needed for severe pain. Patient not taking: Reported on 09/24/2021 07/17/19   Eustaquio Maize, PA-C  tiZANidine (ZANAFLEX) 4 MG tablet Take 1 tablet (4 mg total) by mouth every 8 (eight) hours as needed. 05/17/21   Jaynee Eagles, PA-C    Current Facility-Administered Medications  Medication Dose Route Frequency Provider Last Rate Last Admin   0.9 %  sodium chloride infusion    Intravenous Continuous Dahal, Marlowe Aschoff, MD 75 mL/hr at 09/25/21 1038 New Bag at [id number]   folic acid (FOLVITE) tablet 1 mg  1 mg Oral Daily Sueanne Margarita, DO   1 mg at 09/24/21 2048   LORazepam (ATIVAN) tablet 1-4 mg  1-4 mg Oral Q1H PRN Sueanne Margarita, DO       Or   LORazepam (ATIVAN) injection 1-4 mg  1-4 mg Intravenous Q1H PRN Sueanne Margarita, DO   1 mg at 09/25/21 0305   multivitamin with minerals tablet 1 tablet  1 tablet Oral Daily Sueanne Margarita, DO   1 tablet at 09/24/21 2048   pantoprazole (PROTONIX) EC tablet 40 mg  40 mg Oral BID Sueanne Margarita, DO       polyethylene glycol-electrolytes (NuLYTELY) solution 4,000 mL  4,000 mL Oral Once Lesly Rubenstein, MD       potassium PHOSPHATE 30 mmol in dextrose 5 % 500 mL infusion  30 mmol Intravenous Once Terrilee Croak, MD 85 mL/hr at 09/25/21 1259 30 mmol at 09/25/21 1259   thiamine tablet 100 mg  100 mg Oral Daily Sueanne Margarita, DO       Or   thiamine (B-1) injection 100 mg  100 mg Intravenous Daily Sueanne Margarita, DO   100 mg at 09/25/21 1035   Current Outpatient Medications  Medication Sig Dispense Refill   naproxen (NAPROSYN) 500 MG tablet Take 1 tablet (500 mg total)  by mouth 2 (two) times daily with a meal. (Patient not taking: Reported on 09/24/2021) 30 tablet 0   oxyCODONE-acetaminophen (PERCOCET/ROXICET) 5-325 MG tablet Take 1 tablet by mouth every 4 (four) hours as needed for severe pain. (Patient not taking: Reported on 09/24/2021) 10 tablet 0   tiZANidine (ZANAFLEX) 4 MG tablet Take 1 tablet (4 mg total) by mouth every 8 (eight) hours as needed. 30 tablet 0    Allergies as of 09/24/2021   (No Known Allergies)     Review of Systems:    All systems reviewed and negative except where noted in HPI.  Review of Systems  Constitutional:  Positive for weight loss. Negative for chills and fever.  Eyes:  Negative for pain.  Respiratory:  Negative for shortness of breath.   Cardiovascular:  Negative for leg swelling.   Gastrointestinal:  Positive for nausea and vomiting. Negative for blood in stool, constipation and melena.  Musculoskeletal:  Negative for joint pain.  Neurological:  Positive for weakness.  Psychiatric/Behavioral:  Positive for substance abuse.   All other systems reviewed and are negative.     Physical Exam:  Vital signs in last 24 hours: Temp:  [98.2 F (36.8 C)-98.6 F (37 C)] 98.2 F (36.8 C) (12/05 1000) Pulse Rate:  [72-100] 76 (12/05 1000) Resp:  [14-20] 18 (12/05 1000) BP: (120-153)/(70-92) 129/80 (12/05 1000) SpO2:  [96 %-100 %] 99 % (12/05 1000) Weight:  [79.4 kg] 79.4 kg (12/04 1419)   General:   Appears in no acute distress Head:  Normocephalic and atraumatic. Eyes:   Some scleral icterus Mouth: Mucosa pink moist, no lesions. Neck:  Supple; no masses felt Lungs:  No respiratory distress Abdomen:   Flat, soft, nondistended, nontender Rectal:  Not performed.  Msk:  MAEW x4, No clubbing or cyanosis. Strength 5/5 Neurologic:  Alert and  oriented x4;  Cranial nerves II-XII intact.  Skin:  Warm, dry, pink without significant lesions or rashes. Psych:  Alert and cooperative. Normal affect.  LAB RESULTS: Recent Labs    09/24/21 1422 09/25/21 0737  WBC 10.3 6.1  HGB 16.3 15.2  HCT 45.6 43.1  PLT 290 199   BMET Recent Labs    09/24/21 1422 09/25/21 0737  NA 134* 138  K 3.4* 3.6  CL 93* 99  CO2 25 31  GLUCOSE 164* 88  BUN 13 18  CREATININE 1.80* 1.55*  CALCIUM 9.0 8.8*   LFT Recent Labs    09/24/21 1422 09/25/21 0737  PROT 6.7 6.4*  ALBUMIN 4.0 3.6  AST 240* 257*  ALT 72* 76*  ALKPHOS 92 80  BILITOT 3.9* 3.8*  BILIDIR 1.0*  --   IBILI 2.9*  --    PT/INR Recent Labs    09/24/21 1721 09/25/21 0737  LABPROT 12.0 12.1  INR 0.9 0.9    STUDIES: DG Chest 2 View  Result Date: 09/24/2021 CLINICAL DATA:  Shortness of breath. Upper abdominal pain. Emesis and loss of appetite. EXAM: CHEST - 2 VIEW COMPARISON:  03/21/2005 FINDINGS: 9 mm density  the left lung base at the intersection of the left eighth and tenth ribs, cannot exclude pulmonary nodule. Not well corroborated on the lateral projection. The lungs appear otherwise clear. Cardiac and mediastinal margins appear normal. No blunting of the costophrenic angles. IMPRESSION: 1. 9 mm density projects over the left lung base. Cannot exclude pulmonary nodule. Chest CT recommended for further characterization. Electronically Signed   By: Gaylyn Rong M.D.   On: 09/24/2021 15:35   CT  CHEST ABDOMEN PELVIS W CONTRAST  Result Date: 09/24/2021 CLINICAL DATA:  Evaluate left lower lobe nodule and hepatic biliary pathology. Weight loss, progressive hiccups and postprandial emesis. EXAM: CT CHEST, ABDOMEN, AND PELVIS WITH CONTRAST TECHNIQUE: Multidetector CT imaging of the chest, abdomen and pelvis was performed following the standard protocol during bolus administration of intravenous contrast. CONTRAST:  135mL OMNIPAQUE IOHEXOL 300 MG/ML  SOLN COMPARISON:  None. FINDINGS: CT CHEST FINDINGS Cardiovascular: No significant vascular findings. Normal heart size. No pericardial effusion. Mediastinum/Nodes: No evidence of lymphadenopathy. Diffusely thickened esophagus. Lungs/Pleura: Lungs are clear. No pleural effusion or pneumothorax. Musculoskeletal: No chest wall mass or suspicious bone lesions identified. CT ABDOMEN PELVIS FINDINGS Hepatobiliary: Marked hepatic steatosis.  Normal gallbladder. Pancreas: Unremarkable. No pancreatic ductal dilatation or surrounding inflammatory changes. Spleen: Normal in size without focal abnormality. Adrenals/Urinary Tract: Adrenal glands are unremarkable. Kidneys are normal, without renal calculi, focal lesion, or hydronephrosis. Bladder is unremarkable. Stomach/Bowel: Diffuse mucosal thickening of the stomach. Normal small bowel. Normal appendix. Scattered colonic diverticulosis, particularly prominent in the cecum. Circumferential thickening of the base of the cecum.  Vascular/Lymphatic: No significant vascular findings are present. No enlarged abdominal or pelvic lymph nodes. Reproductive: Prostate is unremarkable. Other: No abdominal wall hernia or abnormality. No abdominopelvic ascites. Musculoskeletal: No acute or significant osseous findings. IMPRESSION: 1. Marked hepatic steatosis. 2. Circumferential thickening of the base of the cecum. This may represent an area of focal colitis, especially given the presence of cecal diverticulosis, however underlying malignancy cannot be excluded. 3. Diffuse mucosal thickening of the stomach. 4. Scattered colonic diverticulosis, particularly prominent in the cecum. 5. No evidence of pulmonary nodules. 6. Correlation with upper GI endoscopy and colonoscopy may be considered. Electronically Signed   By: Fidela Salisbury M.D.   On: 09/24/2021 18:16   US ABDOMEN LIMITED RUQ (LIVER/GB)  Result Date: 09/24/2021 CLINICAL DATA:  Right upper quadrant pain for 2 days EXAM: ULTRASOUND ABDOMEN LIMITED RIGHT UPPER QUADRANT COMPARISON:  CT from earlier in the same day. FINDINGS: Gallbladder: Gallbladder is well distended with dependent sludge. No gallstones are seen. No wall thickening or pericholecystic fluid is noted. Common bile duct: Diameter: 3 mm. Liver: Diffusely increased in echogenicity consistent with fatty infiltration. No focal mass is noted. Portal vein is patent on color Doppler imaging with normal direction of blood flow towards the liver. Other: None. IMPRESSION: Fatty liver. Gallbladder sludge without complicating factors. Electronically Signed   By: Inez Catalina M.D.   On: 09/24/2021 19:56       Impression / Plan:   45 y/o gentleman with alcohol abuse who presents with acute liver injury secondary to alcoholic hepatitis. Has abnormal CT imaging of stomach colon.  # Alcohol Hepatitis - low discriminant function which means good prognosis for recovery - counseled on alcohol abstinence - CIWA - once EGD/Colon complete  would benefit from nutrition consult - f/u viral hepatitis panel  # Abnormal CT imaging - will perform EGD/Colonoscopy tomorrow - clear liquids today, NPO at midnight except for prep - further recs after procedures tomorrow  Raylene Miyamoto MD, MPH [place]

## 2021-09-25 NOTE — Progress Notes (Addendum)
PROGRESS NOTE  Nicholas Fletcher  DOB: 09-28-1976  PCP: Patient, No Pcp Per (Inactive) SNC:[id number]  DOA: 09/24/2021  LOS: 0 days  Hospital Day: 2  Chief Complaint  Patient presents with   Emesis   Loss of appetite    Brief narrative: Nicholas Fletcher is a 45 y.o. male with a history of alcohol abuse. Patient presented to the ED on 12/4 with complaint of longstanding history of food getting stuck in the esophagus, regurgitation, recurrent vomiting, loss of appetite which has progressively worsened.  He states he has lost about 30 pounds in the last couple of months.  Drinks about 3-4 drinks of alcohol a day.  In the ED, he was afebrile, hemodynamically stable Labs with sodium 134, potassium 3.4, BUN/creatinine 13/1.8, AST elevated to 240, ALT elevated to 72, normal alk phos, total bilirubin elevated 3.9 with direct bilirubin slightly elevated at 1, likely 31 WBC count 10.3, hemoglobin 16.3 with MCV 102 Urinalysis with large amount of amber-colored urine with positive nitrite, rare bacteria CT chest abdomen pelvis showed marked hepatic steatosis, concern for focal colitis at the base of the cecum.  Right upper quadrant ultrasound showed fatty liver and gallbladder sludge without complicating factors. Patient was kept on observation under hospitalist service.  Subjective: Patient was seen and examined this morning.  Pleasant middle-aged African-American male.  Not in distress. Chart reviewed Remains hemodynamically stable Labs this morning with creatinine slightly better at 1.55, AST and bilirubin remain elevated.  Assessment/Plan: Chronic recurrent vomiting, regurgitation -State he has throat ulcer, with early satiety, poor oral intake and weight loss. -May need GI assessment to rule out malignancy.  GI consultation called. -Remains n.p.o. this morning.  Elevated liver enzymes Suspect acute hepatitis -In the setting of chronic alcohol use, fatty liver and family history of liver  cirrhosis. -Marked hepatic steatosis noted in CT abdomen. Recent Labs  Lab 09/24/21 1422 09/25/21 0737  AST 240* 257*  ALT 72* 76*  ALKPHOS 92 80  BILITOT 3.9* 3.8*  PROT 6.7 6.4*  ALBUMIN 4.0 3.6   Chronic alcohol use -States he drinks about 4 drinks a day every day -Counseled to quit.  Currently not on withdrawals.  AKI -Presented with creatinine elevated to 1.8.  Creatinine was normal at 0.74 last year. -Expected to improve with hydration.  Continue IV fluid Recent Labs    09/24/21 1422 09/25/21 0737  BUN 13 18  CREATININE 1.80* 1.55*   Prolonged QTC Hypokalemia/hypomagnesemia/hypophosphatemia -QTC prolonged to 579 ms on EKG on admission. -Electrolyte levels low as below. -I will order for IV and oral replacement today.  Repeat labs tomorrow. -Repeat EKG tomorrow. Recent Labs  Lab 09/24/21 1422 09/24/21 1725 09/25/21 0737  K 3.4*  --  3.6  MG 1.7  --   --   PHOS  --  1.4*  --      Focal colitis -CT abdomen showed circumferential thickening of the base of the cecum which may represent focal colitis-doubtful clinical significance   Mobility: Encourage ambulation Living condition: Lives at home Goals of care:   Code Status: Full Code  Nutritional status: Body mass index is 23.73 kg/m.      Diet:  Diet Order             Diet NPO time specified Except for: Sips with Meds  Diet effective now                  DVT prophylaxis:  Place TED hose Start: 09/24/21 2016  Antimicrobials: None Fluid: None at this time.  I will start on normal saline at 75 mill per hour Consultants: GI Family Communication: None at bedside  Status is: Observation  Continue in-hospital care because: Probably needs GI intervention Level of care: Med-Surg   Dispo: The patient is from: Home              Anticipated d/c is to: Home in next 2 to 3 days              Patient currently is not medically stable to d/c.   Difficult to place patient No     Infusions:    sodium chloride 75 mL/hr at 09/25/21 1038   magnesium sulfate bolus IVPB     potassium PHOSPHATE IVPB (in mmol)      Scheduled Meds:  folic acid  1 mg Oral Daily   multivitamin with minerals  1 tablet Oral Daily   pantoprazole  40 mg Oral BID   potassium chloride  40 mEq Oral Once   thiamine  100 mg Oral Daily   Or   thiamine  100 mg Intravenous Daily    PRN meds: LORazepam **OR** LORazepam   Antimicrobials: Anti-infectives (From admission, onward)    None       Objective: Vitals:   09/25/21 0600 09/25/21 0640  BP: 126/83 126/83  Pulse: 77 72  Resp: 16   Temp:    SpO2: 98%     Intake/Output Summary (Last 24 hours) at 09/25/2021 1041 Last data filed at 09/25/2021 0635 Gross per 24 hour  Intake 1970 ml  Output --  Net 1970 ml   Filed Weights   09/24/21 1419  Weight: 79.4 kg   Weight change:  Body mass index is 23.73 kg/m.   Physical Exam: General exam: Pleasant, middle-aged African-American male.  Not in physical distress Skin: No rashes, lesions or ulcers. HEENT: Atraumatic, normocephalic, no obvious bleeding Lungs: Clear to auscultation bilaterally CVS: Regular rate and rhythm, no murmur GI/Abd soft, mild epigastric tenderness, nondistended, some present CNS: Alert, awake, oriented x3 Psychiatry: Mood appropriate Extremities: No pedal edema, no calf tenderness  Data Review: I have personally reviewed the laboratory data and studies available.  F/u labs ordered Unresulted Labs (From admission, onward)     Start     Ordered   09/26/21 0500  Phosphorus  Tomorrow morning,   R        09/25/21 1041   09/26/21 0500  Magnesium  Tomorrow morning,   STAT        09/25/21 1041   09/25/21 0500  CBC  Daily,   STAT      09/24/21 2017   09/25/21 0500  Comprehensive metabolic panel  Daily,   STAT      09/24/21 2017   09/25/21 0500  HIV Antibody (routine testing w rflx)  Once,   STAT        09/25/21 0500   09/24/21 2005  Hepatitis panel, acute  Once,   STAT         09/24/21 2005            Signed, Terrilee Croak, MD [facility] 09/25/2021

## 2021-09-25 NOTE — ED Notes (Signed)
Informed RN bed assigned 

## 2021-09-25 NOTE — ED Notes (Signed)
Patient called out stating he is "feeling very anxious" and his hiccups continue. Will administer PRN medication

## 2021-09-25 NOTE — Progress Notes (Signed)
SLP Cancellation Note  Patient Details Name: Nicholas Fletcher MRN: [id number] DOB: 1976/07/20   Cancelled treatment:       Reason Eval/Treat Not Completed: Medical issues which prohibited therapy. Per chart review, pt NPO pending further GI work up. SLP to defer efforts at this time. RN made aware.   Nicholas Fletcher, M.S., CCC-SLP Speech-Language Pathologist [facility] [phone] Nicholas Fletcher)   Woodroe Chen 09/25/2021, 8:23 AM

## 2021-09-26 ENCOUNTER — Encounter: Payer: Self-pay | Admitting: Internal Medicine

## 2021-09-26 ENCOUNTER — Inpatient Hospital Stay: Payer: Self-pay | Admitting: Anesthesiology

## 2021-09-26 ENCOUNTER — Encounter
Admission: EM | Disposition: A | Discharge status: Home / Self Care | Payer: Self-pay | Source: Home / Self Care | Attending: Internal Medicine

## 2021-09-26 HISTORY — PX: COLONOSCOPY WITH PROPOFOL: SHX5780

## 2021-09-26 HISTORY — PX: ESOPHAGOGASTRODUODENOSCOPY (EGD) WITH PROPOFOL: SHX5813

## 2021-09-26 LAB — CBC
HCT: 37.2 % — ABNORMAL LOW (ref 39.0–52.0)
Hemoglobin: 13.1 g/dL (ref 13.0–17.0)
MCH: 36.1 pg — ABNORMAL HIGH (ref 26.0–34.0)
MCHC: 35.2 g/dL (ref 30.0–36.0)
MCV: 102.5 fL — ABNORMAL HIGH (ref 80.0–100.0)
Platelets: 144 10*3/uL — ABNORMAL LOW (ref 150–400)
RBC: 3.63 MIL/uL — ABNORMAL LOW (ref 4.22–5.81)
RDW: 11.3 % — ABNORMAL LOW (ref 11.5–15.5)
WBC: 4.2 10*3/uL (ref 4.0–10.5)
nRBC: 0 % (ref 0.0–0.2)

## 2021-09-26 LAB — COMPREHENSIVE METABOLIC PANEL
ALT: 90 U/L — ABNORMAL HIGH (ref 0–44)
AST: 297 U/L — ABNORMAL HIGH (ref 15–41)
Albumin: 3 g/dL — ABNORMAL LOW (ref 3.5–5.0)
Alkaline Phosphatase: 82 U/L (ref 38–126)
Anion gap: 6 (ref 5–15)
BUN: 13 mg/dL (ref 6–20)
CO2: 28 mmol/L (ref 22–32)
Calcium: 8 mg/dL — ABNORMAL LOW (ref 8.9–10.3)
Chloride: 103 mmol/L (ref 98–111)
Creatinine, Ser: 1.15 mg/dL (ref 0.61–1.24)
GFR, Estimated: >60 mL/min (ref >60)
Glucose, Bld: 87 mg/dL (ref 70–99)
Potassium: 3.6 mmol/L (ref 3.5–5.1)
Sodium: 137 mmol/L (ref 135–145)
Total Bilirubin: 3 mg/dL — ABNORMAL HIGH (ref 0.3–1.2)
Total Protein: 5.1 g/dL — ABNORMAL LOW (ref 6.5–8.1)

## 2021-09-26 LAB — PHOSPHORUS: Phosphorus: 1.8 mg/dL — ABNORMAL LOW (ref 2.5–4.6)

## 2021-09-26 LAB — MAGNESIUM: Magnesium: 2.6 mg/dL — ABNORMAL HIGH (ref 1.7–2.4)

## 2021-09-26 SURGERY — ESOPHAGOGASTRODUODENOSCOPY (EGD) WITH PROPOFOL
Anesthesia: General

## 2021-09-26 MED ORDER — LIDOCAINE HCL (CARDIAC) PF 100 MG/5ML IV SOSY
PREFILLED_SYRINGE | INTRAVENOUS | Status: DC | PRN
  Administered 2021-09-26: 50 mg via INTRAVENOUS

## 2021-09-26 MED ORDER — BACLOFEN 10 MG PO TABS
5.0000 mg | ORAL_TABLET | Freq: Three times a day (TID) | ORAL | Status: DC | PRN
  Administered 2021-09-26 – 2021-09-28 (×3): 5 mg via ORAL
  Filled 2021-09-26 (×3): qty 1

## 2021-09-26 MED ORDER — SODIUM CHLORIDE 0.9 % IV SOLN
INTRAVENOUS | Status: DC
  Administered 2021-09-26: 1000 mL via INTRAVENOUS

## 2021-09-26 MED ORDER — POTASSIUM PHOSPHATES 15 MMOLE/5ML IV SOLN
30.0000 mmol | Freq: Once | INTRAVENOUS | Status: AC
  Administered 2021-09-26: 30 mmol via INTRAVENOUS
  Filled 2021-09-26: qty 10

## 2021-09-26 MED ORDER — DEXMEDETOMIDINE (PRECEDEX) IN NS 20 MCG/5ML (4 MCG/ML) IV SYRINGE
PREFILLED_SYRINGE | INTRAVENOUS | Status: DC | PRN
  Administered 2021-09-26: 8 ug via INTRAVENOUS

## 2021-09-26 MED ORDER — PROPOFOL 10 MG/ML IV BOLUS
INTRAVENOUS | Status: DC | PRN
  Administered 2021-09-26: 40 mg via INTRAVENOUS
  Administered 2021-09-26: 60 mg via INTRAVENOUS

## 2021-09-26 MED ORDER — SODIUM CHLORIDE 0.9 % IV SOLN
1.0000 g | INTRAVENOUS | Status: AC
  Administered 2021-09-26 – 2021-09-28 (×3): 1 g via INTRAVENOUS
  Filled 2021-09-26: qty 10
  Filled 2021-09-26 (×2): qty 1

## 2021-09-26 MED ORDER — PROPOFOL 500 MG/50ML IV EMUL
INTRAVENOUS | Status: DC | PRN
  Administered 2021-09-26: 175 ug/kg/min via INTRAVENOUS

## 2021-09-26 MED ORDER — MAGNESIUM SULFATE 2 GM/50ML IV SOLN: 2.0000 g | Freq: Once | INTRAVENOUS | Status: DC

## 2021-09-26 NOTE — Anesthesia Procedure Notes (Signed)
Date/Time: 09/26/2021 1:12 PM Performed by: Ginger Carne, CRNA Pre-anesthesia Checklist: Patient identified, Emergency Drugs available, Suction available, Patient being monitored and Timeout performed Patient Re-evaluated:Patient Re-evaluated prior to induction Oxygen Delivery Method: Nasal cannula Preoxygenation: Pre-oxygenation with 100% oxygen Induction Type: IV induction

## 2021-09-26 NOTE — Anesthesia Preprocedure Evaluation (Addendum)
Anesthesia Evaluation  Patient identified by MRN, date of birth, ID band Patient awake    Reviewed: Allergy & Precautions, NPO status , Patient's Chart, lab work & pertinent test results  History of Anesthesia Complications Negative for: history of anesthetic complications  Airway Mallampati: III  TM Distance: <3 FB Neck ROM: Full   Comment: Beard  Dental   Pulmonary Current Smoker and Patient abstained from smoking.,    Pulmonary exam normal        Cardiovascular Normal cardiovascular exam     Neuro/Psych negative neurological ROS  negative psych ROS   GI/Hepatic (+)     substance abuse (in CIWA protocol)  alcohol use, Hepatitis -  abnormal liver enzymes likely secondary to alcoholic hepatitis   Unintentional weight loss  Emesis, dysphagia, throat pain, has had persistent hiccups for 2 weeks.  Since on a liquid diet in house, the n/v has resolved   Endo/Other  negative endocrine ROS  Renal/GU negative Renal ROS  negative genitourinary   Musculoskeletal negative musculoskeletal ROS (+)   Abdominal   Peds  Hematology negative hematology ROS (+)   Anesthesia Other Findings EKG 09/26/21 Normal sinus rhythm Right atrial enlargement Possible Right ventricular hypertrophy Nonspecific T wave abnormality  Reproductive/Obstetrics                           Anesthesia Physical Anesthesia Plan  ASA: 3  Anesthesia Plan: General   Post-op Pain Management:    Induction:   PONV Risk Score and Plan:   Airway Management Planned: Natural Airway  Additional Equipment:   Intra-op Plan:   Post-operative Plan:   Informed Consent: I have reviewed the patients History and Physical, chart, labs and discussed the procedure including the risks, benefits and alternatives for the proposed anesthesia with the patient or authorized representative who has indicated his/her understanding and  acceptance.       Plan Discussed with: CRNA  Anesthesia Plan Comments:         Anesthesia Quick Evaluation

## 2021-09-26 NOTE — Anesthesia Postprocedure Evaluation (Signed)
Anesthesia Post Note  Patient: Nicholas Fletcher  Procedure(s) Performed: ESOPHAGOGASTRODUODENOSCOPY (EGD) WITH PROPOFOL COLONOSCOPY WITH PROPOFOL  Patient location during evaluation: PACU Anesthesia Type: General Level of consciousness: awake and alert Pain management: pain level controlled Vital Signs Assessment: post-procedure vital signs reviewed and stable Respiratory status: spontaneous breathing, nonlabored ventilation, respiratory function stable and patient connected to nasal cannula oxygen Cardiovascular status: blood pressure returned to baseline and stable Postop Assessment: no apparent nausea or vomiting Anesthetic complications: no   No notable events documented.   Last Vitals:  Vitals:   09/26/21 1357 09/26/21 1405  BP: 119/82 (!) 122/92  Pulse: 95   Resp: 15   Temp:    SpO2: 98%     Last Pain:  Vitals:   09/26/21 1425  TempSrc:   PainSc: 0-No pain                 [place] Rosamond M Olisa Quesnel

## 2021-09-26 NOTE — Op Note (Signed)
[facility] Gastroenterology Patient Name: Nicholas Fletcher Procedure Date: 09/26/2021 1:03 PM MRN: [id number] Account #: [id number] Date of Birth: April 30, 1976 Admit Type: Outpatient Age: 45 Room: [facility] ENDO ROOM 1 Gender: Male Note Status: Finalized Instrument Name: Michaelle Birks [id number] Procedure:             Upper GI endoscopy Indications:           Abnormal CT of the GI tract Providers:             Andrey Farmer MD, MD Medicines:             Monitored Anesthesia Care Complications:         No immediate complications. Estimated blood loss:                         Minimal. Procedure:             Pre-Anesthesia Assessment:                        - Prior to the procedure, a History and Physical was                         performed, and patient medications and allergies were                         reviewed. The patient is competent. The risks and                         benefits of the procedure and the sedation options and                         risks were discussed with the patient. All questions                         were answered and informed consent was obtained.                         Patient identification and proposed procedure were                         verified by the physician, the nurse, the anesthetist                         and the technician in the endoscopy suite. Mental                         Status Examination: alert and oriented. Airway                         Examination: normal oropharyngeal airway and neck                         mobility. Respiratory Examination: clear to                         auscultation. CV Examination: normal. Prophylactic                         Antibiotics: The patient does not require prophylactic  antibiotics. Prior Anticoagulants: The patient has                         taken no previous anticoagulant or antiplatelet                         agents. ASA Grade Assessment: III - A  patient with                         severe systemic disease. After reviewing the risks and                         benefits, the patient was deemed in satisfactory                         condition to undergo the procedure. The anesthesia                         plan was to use monitored anesthesia care (MAC).                         Immediately prior to administration of medications,                         the patient was re-assessed for adequacy to receive                         sedatives. The heart rate, respiratory rate, oxygen                         saturations, blood pressure, adequacy of pulmonary                         ventilation, and response to care were monitored                         throughout the procedure. The physical status of the                         patient was re-assessed after the procedure.                        After obtaining informed consent, the endoscope was                         passed under direct vision. Throughout the procedure,                         the patient's blood pressure, pulse, and oxygen                         saturations were monitored continuously. The Endoscope                         was introduced through the mouth, and advanced to the                         second part of duodenum. The upper GI endoscopy was  accomplished without difficulty. The patient tolerated                         the procedure well. Findings:      LA Grade D (one or more mucosal breaks involving at least 75% of       esophageal circumference) esophagitis with bleeding was found. Biopsies       were taken with a cold forceps for histology. Estimated blood loss was       minimal.      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      The exam of the esophagus was otherwise normal.      The entire examined stomach was normal.      The examined duodenum was normal. Impression:             - LA Grade D esophagitis with bleeding. Biopsied.                        - Ectopic gastric mucosa in the upper third of the                         esophagus.                        - Normal stomach.                        - Normal examined duodenum. Recommendation:        - Return patient to hospital ward for ongoing care.                        - Advance diet as tolerated.                        - Continue present medications.                        - Await pathology results.                        - Perform a colonoscopy today. Procedure Code(s):     --- Professional ---                        [phone], Esophagogastroduodenoscopy, flexible,                         transoral; with biopsy, single or multiple Diagnosis Code(s):     --- Professional ---                        K20.91, Esophagitis, unspecified with bleeding                        R93.3, Abnormal findings on diagnostic imaging of                         other parts of digestive tract CPT copyright 2019 American Medical Association. All rights reserved. The codes documented in this report are preliminary and upon coder review may  be revised to meet current  compliance requirements. Andrey Farmer MD, MD 09/26/2021 1:52:51 PM Number of Addenda: 0 Note Initiated On: 09/26/2021 1:03 PM Estimated Blood Loss:  Estimated blood loss was minimal.      [facility]

## 2021-09-26 NOTE — Transfer of Care (Signed)
Immediate Anesthesia Transfer of Care Note  Patient: Nicholas Fletcher  Procedure(s) Performed: ESOPHAGOGASTRODUODENOSCOPY (EGD) WITH PROPOFOL COLONOSCOPY WITH PROPOFOL  Patient Location: PACU  Anesthesia Type:General  Level of Consciousness: sedated  Airway & Oxygen Therapy: Patient Spontanous Breathing  Post-op Assessment: Report given to RN and Post -op Vital signs reviewed and stable  Post vital signs: Reviewed and stable  Last Vitals:  Vitals Value Taken Time  BP 119/82 09/26/21 1357  Temp 36.1 C 09/26/21 1355  Pulse 95 09/26/21 1357  Resp 15 09/26/21 1357  SpO2 98 % 09/26/21 1357    Last Pain:  Vitals:   09/26/21 1355  TempSrc:   PainSc: Asleep      Patients Stated Pain Goal: 0 (09/26/21 1239)  Complications: No notable events documented.

## 2021-09-26 NOTE — Progress Notes (Signed)
SLP Cancellation Note  Patient Details Name: Nicholas Fletcher MRN: [id number] DOB: 1975/11/17   Cancelled treatment:       Reason Eval/Treat Not Completed: Medical issues which prohibited therapy;Patient at procedure or test/unavailable (chart reviewed).   Pt is prepping for GI procedure today -- see notes indicating abnormal liver enzymes likely secondary to alcoholic hepatitis.  ST services will f/u w/ BSE next 1-2 days if needed.      Jerilynn Som, MS, CCC-SLP Speech Language Pathologist Rehab Services [phone] [facility] 09/26/2021, 10:15 AM

## 2021-09-26 NOTE — Progress Notes (Signed)
Patient making frequent trips to bathroom s/p bowel prep. Patient requesting MIVFs be temporarily stopped to allow for more timely access to the bathroom.

## 2021-09-26 NOTE — Progress Notes (Signed)
Initial Nutrition Assessment  DOCUMENTATION CODES:   Not applicable  INTERVENTION:   -RD will follow for diet advancement and add supplements as appropriate  NUTRITION DIAGNOSIS:   Inadequate oral intake related to altered GI function as evidenced by NPO status.  GOAL:   Patient will meet greater than or equal to 90% of their needs  MONITOR:   PO intake, Supplement acceptance, Diet advancement, Labs, Weight trends, Skin, I & O's  REASON FOR ASSESSMENT:   Malnutrition Screening Tool, Consult Assessment of nutrition requirement/status  ASSESSMENT:   Nicholas Fletcher is a 45 year old male with a history of alcohol abuse and presents to ED with persistent, chronic worsening emesis and loss of appetite.  Pt admitted with suspected alcoholic hepatitis.   Reviewed I/O's: +1.3 L x 24 hours and +3.3 L since admission  Pt unavailable at time of visit. Pt down for procedure for 3 attempted visits. RD unable to obtain further nutrition-related history or complete nutrition-focused physical exam at this time.     Per H&P, pt has been experiencing worsening emesis including food getting stuck in esophagus with quick regurgitation. He has had decreased oral intake, reporting he can often eat a half of a hamburger and has lost about 30 pounds in the past few months. Pt also consumes 3-4 alcoholic beverages daily.   Medications reviewed and include folic acid and thiamine.   Labs reviewed: Phos: 1.8, Mg: 2.6.   Diet Order:   Diet Order             Diet NPO time specified  Diet effective now                   EDUCATION NEEDS:   Not appropriate for education at this time  Skin:  Skin Assessment: Reviewed RN Assessment  Last BM:  Unknown  Height:   Ht Readings from Last 1 Encounters:  09/24/21 6' (1.829 m)    Weight:   Wt Readings from Last 1 Encounters:  09/24/21 79.4 kg    Ideal Body Weight:  80.9 kg  BMI:  Body mass index is 23.73 kg/m.  Estimated  Nutritional Needs:   Kcal:  3382-5053  Protein:  120-135 grams  Fluid:  > 2 L    Levada Schilling, RD, LDN, CDCES Registered Dietitian II Certified Diabetes Care and Education Specialist Please refer to [facility] for RD and/or RD on-call/weekend/after hours pager

## 2021-09-26 NOTE — Care Plan (Signed)
EGD showed severe esophagitis. Will need PPI BID PO and carafate slurry BID. Colonoscopy with severe diverticulosis and two large pedunculated polyps that were removed. Will need repeat EGD/Colonoscopy in three months. Will also need close GI f/u as an outpatient due to alcohol hepatitis.  Merlyn Lot MD, MPH [facility]

## 2021-09-26 NOTE — Progress Notes (Signed)
Patient completed bowel prep prior to midnight. Verbalizes understanding of NPO status going forward in preparation for GI procedure.

## 2021-09-26 NOTE — Progress Notes (Signed)
PROGRESS NOTE  Nicholas Fletcher  DOB: Feb 22, 1976  PCP: Patient, No Pcp Per (Inactive) [id number]  DOA: 09/24/2021  LOS: 0 days  Hospital Day: 3  Chief Complaint  Patient presents with   Emesis   Loss of appetite    Brief narrative: Nicholas Fletcher is a 45 y.o. male with a history of alcohol abuse. Patient presented to the ED on 12/4 with complaint of longstanding history of food getting stuck in the esophagus, regurgitation, recurrent vomiting, loss of appetite which has progressively worsened.  He states he has lost about 30 pounds in the last couple of months.  Drinks about 3-4 drinks of alcohol a day.  In the ED, he was afebrile, hemodynamically stable Labs with sodium 134, potassium 3.4, BUN/creatinine 13/1.8, AST elevated to 240, ALT elevated to 72, normal alk phos, total bilirubin elevated 3.9 with direct bilirubin slightly elevated at 1, likely 31 WBC count 10.3, hemoglobin 16.3 with MCV 102 Urinalysis with large amount of amber-colored urine with positive nitrite, rare bacteria CT chest abdomen pelvis showed marked hepatic steatosis, concern for focal colitis at the base of the cecum.  Right upper quadrant ultrasound showed fatty liver and gallbladder sludge without complicating factors. Patient was kept on observation under hospitalist service.  Subjective: Patient was seen and examined this morning.   Middle-aged African-American male.  Lying down in bed.  Not in distress.  Wife at bedside.  Pending EGD this afternoon. Continues to have elevated LFTs.  Assessment/Plan: Chronic recurrent vomiting, regurgitation -State he has throat ulcer, with early satiety, poor oral intake and weight loss. -May need GI assessment to rule out malignancy.  GI consultation obtained.  Pending EGD today. -Remains n.p.o. this morning.  Elevated liver enzymes Suspect acute hepatitis -In the setting of chronic alcohol use, fatty liver and family history of liver cirrhosis. -Marked hepatic  steatosis noted in CT abdomen. -Repeat labs this morning with further elevated AST and ALT but slightly improved bilirubin total. Recent Labs  Lab 09/24/21 1422 09/25/21 0737 09/26/21 0413  AST 240* 257* 297*  ALT 72* 76* 90*  ALKPHOS 92 80 82  BILITOT 3.9* 3.8* 3.0*  PROT 6.7 6.4* 5.1*  ALBUMIN 4.0 3.6 3.0*    Chronic alcohol use -States he drinks about 4 drinks a day every day -Counseled to quit.  Currently not on withdrawals.  AKI -Presented with creatinine elevated to 1.8.  Creatinine was normal at 0.74 last year. -Improved with IV fluid. Recent Labs    09/24/21 1422 09/25/21 0737 09/26/21 0413  BUN _0 CREATININE 1.80* 1.55* 1.15    Prolonged QTC Hypokalemia/hypomagnesemia/hypophosphatemia -QTC prolonged to 579 ms on EKG on admission. -Electrolyte levels were low.  -Potassium and phosphorus level repleted today again.  -Repeat EKG today with QTC improvement to 435 ms.  Continue to monitor electrolytes.   Recent Labs  Lab 09/24/21 1422 09/24/21 1725 09/25/21 0737 09/26/21 0413  K 3.4*  --  3.6 3.6  MG 1.7  --   --  2.6*  PHOS  --  1.4*  --  1.8*    Focal colitis -CT abdomen showed circumferential thickening of the base of the cecum which may represent focal colitis-doubtful clinical significance   Mobility: Encourage ambulation Living condition: Lives at home Goals of care:   Code Status: Full Code  Nutritional status: Body mass index is 23.73 kg/m.      Diet:  Diet Order             Diet  NPO time specified  Diet effective now                  DVT prophylaxis:  Place TED hose Start: 09/24/21 2016   Antimicrobials: None Fluid: None at this time.  I will start on normal saline at 75 mill per hour Consultants: GI Family Communication: None at bedside  Status is: Observation  Continue in-hospital care because: Pending EGD today. Level of care: Med-Surg   Dispo: The patient is from: Home              Anticipated d/c is to:  Depends on EGD finding              Patient currently is not medically stable to d/c.   Difficult to place patient No     Infusions:   sodium chloride Stopped (09/26/21 0509)   sodium chloride 20 mL/hr at 09/26/21 1310   [MAR Hold] cefTRIAXone (ROCEPHIN)  IV 1 g (09/26/21 1030)   [MAR Hold] potassium PHOSPHATE IVPB (in mmol)      Scheduled Meds:  [MAR Hold] folic acid  1 mg Oral Daily   [MAR Hold] multivitamin with minerals  1 tablet Oral Daily   [MAR Hold] pantoprazole  40 mg Oral BID   [MAR Hold] thiamine  100 mg Oral Daily   Or   [MAR Hold] thiamine  100 mg Intravenous Daily    PRN meds: [MAR Hold] LORazepam **OR** [MAR Hold] LORazepam   Antimicrobials: Anti-infectives (From admission, onward)    Start     Dose/Rate Route Frequency Ordered Stop   09/26/21 1000  [MAR Hold]  cefTRIAXone (ROCEPHIN) 1 g in sodium chloride 0.9 % 100 mL IVPB        (MAR Hold since Tue 09/26/2021 at 1230.Hold Reason: Transfer to a Procedural area)   1 g 200 mL/hr over 30 Minutes Intravenous Every 24 hours 09/26/21 0812         Objective: Vitals:   09/26/21 1211 09/26/21 1239  BP: (!) 159/104   Pulse: 80 83  Resp: 20 17  Temp: 98 F (36.7 C) (!) 97.4 F (36.3 C)  SpO2: 99% 100%    Intake/Output Summary (Last 24 hours) at 09/26/2021 1326 Last data filed at 09/25/2021 1808 Gross per 24 hour  Intake 1325.65 ml  Output --  Net 1325.65 ml    Filed Weights   09/24/21 1419  Weight: 79.4 kg   Weight change:  Body mass index is 23.73 kg/m.   Physical Exam: General exam: Pleasant, middle-aged African-American male.  Not in physical distress Skin: No rashes, lesions or ulcers. HEENT: Atraumatic, normocephalic, no obvious bleeding Lungs: Clear to auscultation bilaterally CVS: Regular rate and rhythm, no murmur GI/Abd soft, mild epigastric tenderness, nondistended, bowel sound present CNS: Alert, awake, oriented x3 Psychiatry: Mood appropriate Extremities: No pedal edema, no calf  tenderness  Data Review: I have personally reviewed the laboratory data and studies available.  F/u labs ordered Unresulted Labs (From admission, onward)     Start     Ordered   09/26/21 0813  Urine Culture  Once,   R       Question:  Indication  Answer:  Urgency/frequency   09/26/21 0812   09/25/21 0500  CBC  Daily,   STAT      09/24/21 2017   09/25/21 0500  Comprehensive metabolic panel  Daily,   STAT      09/24/21 2017  Signed, Terrilee Croak, MD [facility] 09/26/2021

## 2021-09-26 NOTE — Op Note (Signed)
[facility] Gastroenterology Patient Name: Nicholas Fletcher Procedure Date: 09/26/2021 1:02 PM MRN: [id number] Account #: [id number] Date of Birth: 17-Feb-1976 Admit Type: Inpatient Age: 45 Room: [facility] ENDO ROOM 1 Gender: Male Note Status: Finalized Instrument Name: Jasper Riling [id number] Procedure:             Colonoscopy Indications:           Abnormal CT of the GI tract Providers:             Andrey Farmer MD, MD Medicines:             Monitored Anesthesia Care Complications:         No immediate complications. Estimated blood loss:                         Minimal. Procedure:             Pre-Anesthesia Assessment:                        - Prior to the procedure, a History and Physical was                         performed, and patient medications and allergies were                         reviewed. The patient is competent. The risks and                         benefits of the procedure and the sedation options and                         risks were discussed with the patient. All questions                         were answered and informed consent was obtained.                         Patient identification and proposed procedure were                         verified by the physician, the nurse, the anesthetist                         and the technician in the endoscopy suite. Mental                         Status Examination: alert and oriented. Airway                         Examination: normal oropharyngeal airway and neck                         mobility. Respiratory Examination: clear to                         auscultation. CV Examination: normal. Prophylactic                         Antibiotics: The patient does not require prophylactic  antibiotics. Prior Anticoagulants: The patient has                         taken no previous anticoagulant or antiplatelet                         agents. ASA Grade Assessment: III - A patient with                          severe systemic disease. After reviewing the risks and                         benefits, the patient was deemed in satisfactory                         condition to undergo the procedure. The anesthesia                         plan was to use monitored anesthesia care (MAC).                         Immediately prior to administration of medications,                         the patient was re-assessed for adequacy to receive                         sedatives. The heart rate, respiratory rate, oxygen                         saturations, blood pressure, adequacy of pulmonary                         ventilation, and response to care were monitored                         throughout the procedure. The physical status of the                         patient was re-assessed after the procedure.                        After obtaining informed consent, the colonoscope was                         passed under direct vision. Throughout the procedure,                         the patient's blood pressure, pulse, and oxygen                         saturations were monitored continuously. The                         Colonoscope was introduced through the anus and                         advanced to the the cecum, identified by appendiceal  orifice and ileocecal valve. The colonoscopy was                         performed without difficulty. The patient tolerated                         the procedure well. The quality of the bowel                         preparation was inadequate. Findings:      The perianal and digital rectal examinations were normal.      A 3 mm polyp was found in the cecum. The polyp was sessile. The polyp       was removed with a cold snare. Resection and retrieval were complete.       Estimated blood loss was minimal.      A 20 mm polyp was found in the ascending colon. The polyp was       pedunculated. To prevent bleeding post-intervention,  three hemostatic       clips were successfully placed. There was no bleeding during, or at the       end, of the procedure. The polyp was removed with a hot snare. Resection       and retrieval were complete. Estimated blood loss was minimal.      A 15 mm polyp was found in the ascending colon. The polyp was       semi-pedunculated. The polyp was removed with a hot snare. Resection and       retrieval were complete. Estimated blood loss was minimal.      Many small-mouthed diverticula were found in the sigmoid colon,       descending colon, splenic flexure, transverse colon, ascending colon and       cecum. Impression:            - Preparation of the colon was inadequate.                        - One 3 mm polyp in the cecum, removed with a cold                         snare. Resected and retrieved.                        - One 20 mm polyp in the ascending colon, removed with                         a hot snare. Resected and retrieved. Clips were placed.                        - One 15 mm polyp in the ascending colon, removed with                         a hot snare. Resected and retrieved.                        - Diverticulosis in the sigmoid colon, in the                         descending colon, at the splenic  flexure, in the                         transverse colon, in the ascending colon and in the                         cecum. Recommendation:        - Return patient to hospital ward for ongoing care.                        - Resume previous diet.                        - Continue present medications.                        - Await pathology results.                        - Repeat colonoscopy within 3 months because the bowel                         preparation was suboptimal. Patient will also need                         repeat EGD in 3 months to assess healing of                         esophagitis.                        - Return to referring physician as previously                          scheduled. Procedure Code(s):     --- Professional ---                        [id number], Colonoscopy, flexible; with removal of                         tumor(s), polyp(s), or other lesion(s) by snare                         technique Diagnosis Code(s):     --- Professional ---                        K63.5, Polyp of colon                        K57.30, Diverticulosis of large intestine without                         perforation or abscess without bleeding                        R93.3, Abnormal findings on diagnostic imaging of                         other parts of digestive tract CPT copyright 2019 American Medical Association. All rights reserved. The codes documented in this report are preliminary  and upon coder review may  be revised to meet current compliance requirements. Andrey Farmer MD, MD 09/26/2021 1:58:09 PM Number of Addenda: 0 Note Initiated On: 09/26/2021 1:02 PM Scope Withdrawal Time: 0 hours 15 minutes 5 seconds  Total Procedure Duration: 0 hours 19 minutes 56 seconds  Estimated Blood Loss:  Estimated blood loss was minimal.      [facility]

## 2021-09-27 ENCOUNTER — Encounter: Payer: Self-pay | Admitting: Gastroenterology

## 2021-09-27 LAB — CBC
HCT: 37.5 % — ABNORMAL LOW (ref 39.0–52.0)
HCT: 39.8 % (ref 39.0–52.0)
Hemoglobin: 12.9 g/dL — ABNORMAL LOW (ref 13.0–17.0)
Hemoglobin: 13.8 g/dL (ref 13.0–17.0)
MCH: 36.1 pg — ABNORMAL HIGH (ref 26.0–34.0)
MCH: 37 pg — ABNORMAL HIGH (ref 26.0–34.0)
MCHC: 34.4 g/dL (ref 30.0–36.0)
MCHC: 34.7 g/dL (ref 30.0–36.0)
MCV: 105 fL — ABNORMAL HIGH (ref 80.0–100.0)
MCV: 106.7 fL — ABNORMAL HIGH (ref 80.0–100.0)
Platelets: 138 10*3/uL — ABNORMAL LOW (ref 150–400)
Platelets: 149 10*3/uL — ABNORMAL LOW (ref 150–400)
RBC: 3.57 MIL/uL — ABNORMAL LOW (ref 4.22–5.81)
RBC: 3.73 MIL/uL — ABNORMAL LOW (ref 4.22–5.81)
RDW: 11.3 % — ABNORMAL LOW (ref 11.5–15.5)
RDW: 11.5 % (ref 11.5–15.5)
WBC: 4.2 10*3/uL (ref 4.0–10.5)
WBC: 4.7 10*3/uL (ref 4.0–10.5)
nRBC: 0 % (ref 0.0–0.2)
nRBC: 0 % (ref 0.0–0.2)

## 2021-09-27 LAB — COMPREHENSIVE METABOLIC PANEL
ALT: 98 U/L — ABNORMAL HIGH (ref 0–44)
AST: 239 U/L — ABNORMAL HIGH (ref 15–41)
Albumin: 3.1 g/dL — ABNORMAL LOW (ref 3.5–5.0)
Alkaline Phosphatase: 87 U/L (ref 38–126)
Anion gap: 5 (ref 5–15)
BUN: 14 mg/dL (ref 6–20)
CO2: 30 mmol/L (ref 22–32)
Calcium: 8.5 mg/dL — ABNORMAL LOW (ref 8.9–10.3)
Chloride: 101 mmol/L (ref 98–111)
Creatinine, Ser: 1.21 mg/dL (ref 0.61–1.24)
GFR, Estimated: >60 mL/min (ref >60)
Glucose, Bld: 102 mg/dL — ABNORMAL HIGH (ref 70–99)
Potassium: 4.1 mmol/L (ref 3.5–5.1)
Sodium: 136 mmol/L (ref 135–145)
Total Bilirubin: 1.7 mg/dL — ABNORMAL HIGH (ref 0.3–1.2)
Total Protein: 5.7 g/dL — ABNORMAL LOW (ref 6.5–8.1)

## 2021-09-27 LAB — IRON AND TIBC
Iron: 31 ug/dL — ABNORMAL LOW (ref 45–182)
Saturation Ratios: 16 % — ABNORMAL LOW (ref 17.9–39.5)
TIBC: 197 ug/dL — ABNORMAL LOW (ref 250–450)
UIBC: 166 ug/dL

## 2021-09-27 LAB — CBC WITH DIFFERENTIAL/PLATELET
Abs Immature Granulocytes: 0.01 10*3/uL (ref 0.00–0.07)
Basophils Absolute: 0 10*3/uL (ref 0.0–0.1)
Basophils Relative: 0 %
Eosinophils Absolute: 0 10*3/uL (ref 0.0–0.5)
Eosinophils Relative: 1 %
HCT: 40.5 % (ref 39.0–52.0)
Hemoglobin: 14.4 g/dL (ref 13.0–17.0)
Immature Granulocytes: 0 %
Lymphocytes Relative: 19 %
Lymphs Abs: 1 10*3/uL (ref 0.7–4.0)
MCH: 37.4 pg — ABNORMAL HIGH (ref 26.0–34.0)
MCHC: 35.6 g/dL (ref 30.0–36.0)
MCV: 105.2 fL — ABNORMAL HIGH (ref 80.0–100.0)
Monocytes Absolute: 0.8 10*3/uL (ref 0.1–1.0)
Monocytes Relative: 14 %
Neutro Abs: 3.6 10*3/uL (ref 1.7–7.7)
Neutrophils Relative %: 66 %
Platelets: 148 10*3/uL — ABNORMAL LOW (ref 150–400)
RBC: 3.85 MIL/uL — ABNORMAL LOW (ref 4.22–5.81)
RDW: 11.4 % — ABNORMAL LOW (ref 11.5–15.5)
WBC: 5.4 10*3/uL (ref 4.0–10.5)
nRBC: 0 % (ref 0.0–0.2)

## 2021-09-27 LAB — FERRITIN: Ferritin: 718 ng/mL — ABNORMAL HIGH (ref 24–336)

## 2021-09-27 LAB — RETICULOCYTES
Immature Retic Fract: 8.6 % (ref 2.3–15.9)
RBC.: 3.7 MIL/uL — ABNORMAL LOW (ref 4.22–5.81)
Retic Count, Absolute: 56.2 10*3/uL (ref 19.0–186.0)
Retic Ct Pct: 1.5 % (ref 0.4–3.1)

## 2021-09-27 LAB — VITAMIN B12: Vitamin B-12: 602 pg/mL (ref 180–914)

## 2021-09-27 LAB — TYPE AND SCREEN
ABO/RH(D): B POS
Antibody Screen: NEGATIVE

## 2021-09-27 LAB — FOLATE: Folate: 8.3 ng/mL (ref >5.9)

## 2021-09-27 LAB — LACTIC ACID, PLASMA: Lactic Acid, Venous: 1.2 mmol/L (ref 0.5–1.9)

## 2021-09-27 MED ORDER — PEG 3350-KCL-NA BICARB-NACL 420 G PO SOLR
4000.0000 mL | Freq: Once | ORAL | Status: AC
  Administered 2021-09-27: 21:00:00 4000 mL via ORAL
  Filled 2021-09-27: qty 4000

## 2021-09-27 NOTE — Progress Notes (Signed)
Pt has had a total of 4 episodes of bloody drainage per rectum.  The first 2 times, pt did not report it to me stating "I wanted to spare y'all of all that".  The third time, I was able to help him to the BR and saw bright red blood that had dripped onto the floor and the seat of the commode approximately 5-10 mls.  I put the [facility] beside the bed and activated the bed alarm since pt's wife had gone home.  The 4th time he was up to the [facility] the bleeding was minimal with just a few drops.  Each time he gets up, he isn't sure he has to void or have a BM and is incontinent of urine in the bed and on the floor until he gets to the commode.  Pt denies any pain.  Dr. Toniann Fail up to see pt.  See new orders.  Hilton Sinclair BSN RN [facility]    09/27/21 0032  Vitals  BP 136/86  MAP (mmHg) 99  BP Location Right Arm  BP Method Automatic  Patient Position (if appropriate) Lying  Pulse Rate 78  Pulse Rate Source Monitor  Resp 18  MEWS COLOR  MEWS Score Color Green  Oxygen Therapy  SpO2 99 %  O2 Device Room [facility]

## 2021-09-27 NOTE — Progress Notes (Signed)
PROGRESS NOTE  Nicholas Fletcher  DOB: [birth year]  PCP: Patient, No Pcp Per (Inactive) JAS:[id number]  DOA: 09/24/2021  LOS: 1 day  Hospital Day: 4  Chief Complaint  Patient presents with   Emesis   Loss of appetite    Brief narrative: Nicholas Fletcher is a 45 y.o. male with a history of alcohol abuse. Patient presented to the ED on 12/4 with complaint of longstanding history of food getting stuck in the esophagus, regurgitation, recurrent vomiting, loss of appetite which has progressively worsened.  He states he has lost about 30 pounds in the last couple of months.  Drinks about 3-4 drinks of alcohol a day.  In the ED, he was afebrile, hemodynamically stable Labs with sodium 134, potassium 3.4, BUN/creatinine 13/1.8, AST elevated to 240, ALT elevated to 72, normal alk phos, total bilirubin elevated 3.9 with direct bilirubin slightly elevated at 1, likely 31 WBC count 10.3, hemoglobin 16.3 with MCV 102 Urinalysis with large amount of amber-colored urine with positive nitrite, rare bacteria CT chest abdomen pelvis showed marked hepatic steatosis, concern for focal colitis at the base of the cecum.  Right upper quadrant ultrasound showed fatty liver and gallbladder sludge without complicating factors. Patient was kept on observation under hospitalist service.  Subjective: Patient was seen and examined this morning.   Pleasant middle-aged African-American male.  Lying down in bed.  Not in distress.   Continues to have hiccups. Since last night, patient has had multiple episodes of bowel movements with mixed dark and bright blood.   Assessment/Plan: Acute severe esophagitis -Patient presented with severe regurgitation symptoms, dysphagia, poor oral intake, weight loss -EGD 12/6 showed LA grade D (severe) esophagitis with bleeding, that was biopsied.  Normal stomach and duodenum. -Patient has been started on Protonix twice daily.  GI recommended repeat EGD in 3 months to assess the  healing of esophagitis. -Colonoscopy 12/6 showed many diverticulitis throughout the colon, 3 polyps were removed. Repeat colonoscopy recommended in 3 months as the preparation was suboptimal.  BRBPR -Since last time, patient is having multiple episodes of bowel movement with dark stool mixed with fresh blood.  -Probably related to the finding of severe esophagitis with acute bleeding as well as post-polypectomy status. -Monitor for next 24 hours.  Persistent hiccups -Patient states he has been having persistent hiccups for the last several weeks.  Unclear if it is related to severe esophagitis. -I started him on baclofen PRN yesterday.  Continue to monitor.  Abnormal urinalysis -Urinalysis with positive nitrate.  Empirically started on 3-day course of IV Rocephin.  Pending urine culture report  Macrocytosis -Likely due to alcohol use.  Obtain anemia panel Recent Labs    09/24/21 1422 09/25/21 0737 09/26/21 0413 09/27/21 0227 09/27/21 0616 09/27/21 1015  HGB 16.3 15.2 13.1 14.4 12.9* 13.8   Acute hepatitis Marked hepatic steatosis Chronic alcoholism -Presented with elevated liver enzymes in the setting of chronic alcohol use -CT abdomen showed marked hepatic steatosis. -Continues to have elevated liver enzymes. -States he drinks about 4 drinks every night.  CIWA protocol.  Counseled to quit alcohol. Recent Labs  Lab 09/24/21 1422 09/25/21 0737 09/26/21 0413 09/27/21 0227  AST 240* 257* 297* 239*  ALT 72* 76* 90* 98*  ALKPHOS 92 80 82 87  BILITOT 3.9* 3.8* 3.0* 1.7*  PROT 6.7 6.4* 5.1* 5.7*  ALBUMIN 4.0 3.6 3.0* 3.1*   AKI -Presented with creatinine elevated to 1.8.  Creatinine was normal at 0.74 last year. -Improved with IV fluid. Recent Labs  09/24/21 1422 09/25/21 0737 09/26/21 0413 09/27/21 0227  BUN _0 CREATININE 1.80* 1.55* 1.15 1.21   Prolonged QTC Hypokalemia/hypomagnesemia/hypophosphatemia -QTC prolonged to 579 ms on EKG on  admission. -Electrolyte levels were low and repleted. -Repeat EKG 12/6 with QTC improvement to 435 ms.  Continue to monitor electrolytes.   Recent Labs  Lab 09/24/21 1422 09/24/21 1725 09/25/21 0737 09/26/21 0413 09/27/21 0227  K 3.4*  --  3.6 3.6 4.1  MG 1.7  --   --  2.6*  --   PHOS  --  1.4*  --  1.8*  --      Mobility: Encourage ambulation Living condition: Lives at home Goals of care:   Code Status: Full Code  Nutritional status: Body mass index is 23.73 kg/m.  Nutrition Problem: Inadequate oral intake Etiology: altered GI function Signs/Symptoms: NPO status Diet:  Diet Order             Diet clear liquid Room service appropriate? Yes; Fluid consistency: Thin  Diet effective now                  DVT prophylaxis:  Place TED hose Start: 09/24/21 2016   Antimicrobials: None Fluid: Stop IV hydration Consultants: GI Family Communication: None at bedside  Status is: Observation  Continue in-hospital care because: Needs further 24-hour monitoring because BRBPR Level of care: Med-Surg   Dispo: The patient is from: Home              Anticipated d/c is to: Likely home tomorrow              Patient currently is not medically stable to d/c.   Difficult to place patient No     Infusions:   cefTRIAXone (ROCEPHIN)  IV 1 g (09/27/21 1025)    Scheduled Meds:  folic acid  1 mg Oral Daily   multivitamin with minerals  1 tablet Oral Daily   pantoprazole  40 mg Oral BID   thiamine  100 mg Oral Daily   Or   thiamine  100 mg Intravenous Daily    PRN meds: baclofen, LORazepam **OR** LORazepam   Antimicrobials: Anti-infectives (From admission, onward)    Start     Dose/Rate Route Frequency Ordered Stop   09/26/21 1000  cefTRIAXone (ROCEPHIN) 1 g in sodium chloride 0.9 % 100 mL IVPB        1 g 200 mL/hr over 30 Minutes Intravenous Every 24 hours 09/26/21 0812         Objective: Vitals:   09/27/21 0520 09/27/21 0742  BP: (!) 141/102 (!) 125/92  Pulse:  81 72  Resp: 16 16  Temp: 97.9 F (36.6 C) 98.2 F (36.8 C)  SpO2: 100% 100%    Intake/Output Summary (Last 24 hours) at 09/27/2021 1123 Last data filed at 09/27/2021 0600 Gross per 24 hour  Intake 3114.29 ml  Output 125 ml  Net 2989.29 ml   Filed Weights   09/24/21 1419  Weight: 79.4 kg   Weight change:  Body mass index is 23.73 kg/m.   Physical Exam: General exam: Pleasant, middle-aged African-American male.  In mild to moderate distress because of persistent hiccups Skin: No rashes, lesions or ulcers. HEENT: Atraumatic, normocephalic, no obvious bleeding Lungs: Clear to auscultation bilaterally CVS: Regular rate and rhythm, no murmur GI/Abd soft, continues to have mild epigastric tenderness, nondistended, bowel sound present CNS: Alert, awake, oriented x3 Psychiatry: Mood appropriate Extremities: No pedal edema, no calf tenderness  Data  Review: I have personally reviewed the laboratory data and studies available.  F/u labs ordered Unresulted Labs (From admission, onward)     Start     Ordered   09/27/21 1119  Vitamin B12  (Anemia Panel (PNL))  Add-on,   AD        09/27/21 1119   09/27/21 1119  Folate  (Anemia Panel (PNL))  Add-on,   AD        09/27/21 1119   09/27/21 1119  Iron and TIBC  (Anemia Panel (PNL))  Add-on,   AD        09/27/21 1119   09/27/21 1119  Ferritin  (Anemia Panel (PNL))  Add-on,   AD        09/27/21 1119   09/27/21 1119  Reticulocytes  (Anemia Panel (PNL))  Add-on,   AD        09/27/21 1119   09/27/21 0219  CBC  Now then every 4 hours,   TIMED      09/27/21 0218   09/26/21 0813  Urine Culture  Once,   R       Question:  Indication  Answer:  Urgency/frequency   09/26/21 0812   09/25/21 0500  CBC  Daily,   STAT      09/24/21 2017   09/25/21 0500  Comprehensive metabolic panel  Daily,   STAT      09/24/21 2017            Signed, Terrilee Croak, MD [facility] 09/27/2021

## 2021-09-27 NOTE — Care Plan (Signed)
Patient with persistent bleeding. Will plan on colonoscopy tomorrow. Prep ordered. Discussed with patient.  Merlyn Lot MD, MPH [facility] GI

## 2021-09-27 NOTE — Significant Event (Signed)
The nurse notified me that patient had bleeding per rectum.  As per the report the first episode had about 5 to 10 mL.  And then later had 2 more episodes which were mild.  On exam at bedside abdomen appears benign.  Patient hemodynamically stable at this time.  We will keep patient n.p.o. check CBC stat and every 4.  Increase fluids.  We will continue to monitor closely.  I reviewed patient's chart labs and medications.  Midge Minium

## 2021-09-27 NOTE — Progress Notes (Addendum)
GI Inpatient Follow-up Note  Subjective:  Patient seen and had some hematochezia. Most recent episode with 30 mL of maroon stool. No other symptoms. Actually feels better than when he came in.  Scheduled Inpatient Medications:   folic acid  1 mg Oral Daily   multivitamin with minerals  1 tablet Oral Daily   pantoprazole  40 mg Oral BID   thiamine  100 mg Oral Daily   Or   thiamine  100 mg Intravenous Daily    Continuous Inpatient Infusions:    cefTRIAXone (ROCEPHIN)  IV 1 g (09/27/21 1025)    PRN Inpatient Medications:  baclofen, LORazepam **OR** LORazepam  Review of Systems:  Review of Systems  Constitutional:  Negative for chills, fever and malaise/fatigue.  Eyes:  Negative for pain.  Respiratory:  Negative for shortness of breath.   Cardiovascular:  Negative for chest pain.  Gastrointestinal:  Positive for blood in stool. Negative for abdominal pain, nausea and vomiting.  Skin:  Negative for itching and rash.  Neurological:  Negative for focal weakness.  Psychiatric/Behavioral:  Negative for substance abuse.   All other systems reviewed and are negative.    Physical Examination: BP (!) 145/100 (BP Location: Right Arm)   Pulse 84   Temp 98.5 F (36.9 C) (Oral)   Resp 14   Ht 6' (1.829 m)   Wt 79.4 kg   SpO2 100%   BMI 23.73 kg/m  Gen: NAD, alert and oriented x 4 HEENT: PEERLA, EOMI, Neck: supple, no JVD or thyromegaly Chest: No respiratory distress Abd: soft, non-tender, non-distended Ext: no edema, well perfused with 2+ pulses, Skin: no rash or lesions noted Lymph: no LAD  Data: Lab Results  Component Value Date   WBC 4.7 09/27/2021   HGB 13.8 09/27/2021   HCT 39.8 09/27/2021   MCV 106.7 (H) 09/27/2021   PLT 149 (L) 09/27/2021   Recent Labs  Lab 09/27/21 0227 09/27/21 0616 09/27/21 1015  HGB 14.4 12.9* 13.8   Lab Results  Component Value Date   NA 136 09/27/2021   K 4.1 09/27/2021   CL 101 09/27/2021   CO2 30 09/27/2021   BUN 14  09/27/2021   CREATININE 1.21 09/27/2021   Lab Results  Component Value Date   ALT 98 (H) 09/27/2021   AST 239 (H) 09/27/2021   ALKPHOS 87 09/27/2021   BILITOT 1.7 (H) 09/27/2021   Recent Labs  Lab 09/25/21 0737  INR 0.9   Assessment/Plan: 45 y/o gentleman with alcohol abuse who presents with acute liver injury secondary to alcoholic hepatitis. EGD/Colon yesterday for abnormal imaging showing esophagitis and two large pedunculated polyps that were removed. Some hematochezia today concerning for post-polypectomy bleeding. Hemodynamically stable with normal hemoglobin.  Recommendations:  # Post-polypectomy bleeding - hemodynamically stable, normal hemoglobin, and small volume hematochezia - will re-evaluate this afternoon, if persistent will plan on repeat colonoscopy tomorrow - clear liquid diet for now  # Alcohol Hepatitis - low discriminant function which means good prognosis for recovery - counseled on alcohol abstinence - CIWA   Please call with any questions or concerns.  Merlyn Lot MD, MPH [facility] GI

## 2021-09-27 NOTE — Evaluation (Signed)
Clinical/Bedside Swallow Evaluation Patient Details  Name: JOHNTAVIUS SHEPARD MRN: [id number] Date of Birth: 1976/06/10  Today's Date: 09/27/2021 Time: SLP Start Time (ACUTE ONLY): 1105 SLP Stop Time (ACUTE ONLY): 1150 SLP Time Calculation (min) (ACUTE ONLY): 45 min  Past Medical History: History reviewed. No pertinent past medical history. Past Surgical History:  Past Surgical History:  Procedure Laterality Date   TONSILLECTOMY     HPI:  Pt is a 45 y.o. male with a history of alcohol abuse. Patient presented with severe regurgitation symptoms, Esophageal phase dysphagia, odynophagia, poor oral intake, weight loss.  Patient presented to the ED on 12/4 with complaint of longstanding history of food getting stuck in the esophagus, regurgitation, recurrent vomiting, loss of appetite which has progressively worsened.  He states he has lost about 30 pounds in the last couple of months.  Drinks about 3-4 drinks of alcohol a day. In the ED, he was afebrile, hemodynamically stable.  CT chest abdomen pelvis showed marked hepatic steatosis, concern for focal colitis at the base of the cecum.  Right upper quadrant ultrasound showed fatty liver and gallbladder sludge without complicating factors.  Patient was kept on observation under hospitalist service.  GI is following; PPI initiated.    Assessment / Plan / Recommendation  Clinical Impression  Pt appears to present w/ adequate oropharyngeal phase swallow function w/ No oropharyngeal phase dysphagia noted, No neuromuscular deficits noted. Pt consumed po trials w/ No overt, clinical s/s of aspiration during po trials. Pt appears at reduced risk for aspiration following general aspiration precautions. 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 Pt c/o "sore throat" when swallowing initially at admit(odynophagia) -- suspect impact from Regugitation, ETOH issues.    During po trials, pt consumed all trials w/  no overt coughing, decline in vocal quality, or change in respiratory presentation during/post trials. Oral phase appeared [facility] w/ timely bolus management and control of bolus propulsion for A-P transfer for swallowing. Oral clearing achieved. Solids were not assessed d/t GI diet restrictions currently but have no concern about ability to orally manage increased textured foods when able to have them per GI. OM Exam appeared [facility] w/ no unilateral weakness noted. Speech Clear. Pt fed self.   Recommend a Regular consistency diet w/ well-Cut meats, moistened foods for ease of swallowing/Esophageal ease; Thin liquids. Recommend general aspiration precautions. Time spent on Education of Reflux precautions, Pills in Puree if allows ease of swallowing Esophageally; food consistencies and easy to eat options; general aspiration and Reflux precautions. NSG to reconsult if any new needs arise. NSG agreed. SLP Visit Diagnosis: Dysphagia, unspecified (R13.10)    Aspiration Risk  No limitations    Diet Recommendation   Regular consistency diet w/ well-Cut meats, moistened foods for ease of swallowing/Esophageal ease; Thin liquids  Medication Administration: Whole meds with liquid    Other  Recommendations Recommended Consults: Consider GI evaluation (following) Oral Care Recommendations: Oral care BID;Patient independent with oral care Other Recommendations:  (n/a)    Recommendations for follow up therapy are one component of a multi-disciplinary discharge planning process, led by the attending physician.  Recommendations may be updated based on patient status, additional functional criteria and insurance authorization.  Follow up Recommendations No SLP follow up      Assistance Recommended at Discharge None  Functional Status Assessment Patient has had a recent decline in their functional status and demonstrates the ability to make significant improvements in function in a reasonable and  predictable amount  of time.  Frequency and Duration  (n/a)   (n/a)       Prognosis Prognosis for Safe Diet Advancement: Good Barriers to Reach Goals:  (Esophageal phase dysmotility)      Swallow Study   General Date of Onset: 09/24/21 HPI: Pt is a 45 y.o. male with a history of alcohol abuse. Patient presented with severe regurgitation symptoms, Esophageal phase dysphagia, odynophagia, poor oral intake, weight loss.  Patient presented to the ED on 12/4 with complaint of longstanding history of food getting stuck in the esophagus, regurgitation, recurrent vomiting, loss of appetite which has progressively worsened.  He states he has lost about 30 pounds in the last couple of months.  Drinks about 3-4 drinks of alcohol a day. In the ED, he was afebrile, hemodynamically stable.  CT chest abdomen pelvis showed marked hepatic steatosis, concern for focal colitis at the base of the cecum.  Right upper quadrant ultrasound showed fatty liver and gallbladder sludge without complicating factors.  Patient was kept on observation under hospitalist service.  GI is following; PPI initiated. Type of Study: Bedside Swallow Evaluation Previous Swallow Assessment: none Diet Prior to this Study: Thin liquids (clears per GI) Temperature Spikes Noted: No Respiratory Status: Room air History of Recent Intubation: No Behavior/Cognition: Alert;Cooperative;Pleasant mood Oral Cavity Assessment: Within Functional Limits Oral Care Completed by SLP: Recent completion by staff Oral Cavity - Dentition: Adequate natural dentition Vision: Functional for self-feeding Self-Feeding Abilities: Able to feed self Patient Positioning: Upright in bed Baseline Vocal Quality: Normal Volitional Cough: Strong Volitional Swallow: Able to elicit    Oral/Motor/Sensory Function Overall Oral Motor/Sensory Function: Within functional limits   Ice Chips Ice chips: Not tested   Thin Liquid Thin Liquid: Within functional limits Presentation: Self  Fed;Straw;Spoon (popcicle; ~3-4 ozs of fluids)    Nectar Thick Nectar Thick Liquid: Not tested   Honey Thick Honey Thick Liquid: Not tested   Puree Puree: Not tested   Solid     Solid: Not tested        Orinda Kenner, MS, CCC-SLP Speech Language Pathologist [facility] [phone] [facility] 09/27/2021,12:01 PM

## 2021-09-27 NOTE — TOC Initial Note (Signed)
Transition of Care [facility]) - Initial/Assessment Note    Patient Details  Name: Nicholas Fletcher MRN: [id number] Date of Birth: 03-10-1976  Transition of Care [facility]) CM/SW Contact:    Caryn Section, RN Phone Number: 09/27/2021, 4:22 PM  Clinical Narrative:    Patient lives with wife and son.  He Fletcher he has transportation to appointments and to the pharmacy.  He does not have any concerns with taking medications as directed.   Patient Fletcher the only concern he has about returning home currently is that he goes out with his coworkers and friends after work for alcoholic beverages and he would like assistance about how to cut down on his intake.  Substance abuse resources provided.  Patient is motivated and actively wants to look into receiving assistance with his alcohol use  He graciously accepted Pharmacist, hospital.    RNCM offered for patient to speak to the chaplain, he declined at this time, but information given to contact care team if he would like to speak in the future.  Patient has not other concerns about returning home at discharge to his family. TOC contact information given, TOC to follow for need.s         Expected Discharge Plan: Home/Self Care Barriers to Discharge: Continued Medical Work up   Patient Goals and CMS Choice Patient Fletcher their goals for this hospitalization and ongoing recovery are:: To go home and drink less than I am now   Choice offered to / list presented to : NA  Expected Discharge Plan and Services Expected Discharge Plan: Home/Self Care In-house Referral: NA Discharge Planning Services: CM Consult Post Acute Care Choice: NA Living arrangements for the past 2 months: Single Family Home                                      Prior Living Arrangements/Services Living arrangements for the past 2 months: Single Family Home Lives with:: Self, Spouse Patient language and need for interpreter reviewed:: Yes (Interpreter not required) Do you feel  safe going back to the place where you live?: Yes      Need for Family Participation in Patient Care: Yes (Comment) Care giver support system in place?: Yes (comment) Current home services:  (n/a) Criminal Activity/Legal Involvement Pertinent to Current Situation/Hospitalization: No - Comment as needed  Activities of Daily Living Home Assistive Devices/Equipment: None ADL Screening (condition at time of admission) Patient's cognitive ability adequate to safely complete daily activities?: Yes Is the patient deaf or have difficulty hearing?: No Does the patient have difficulty seeing, even when wearing glasses/contacts?: No Does the patient have difficulty concentrating, remembering, or making decisions?: No Patient able to express need for assistance with ADLs?: Yes Does the patient have difficulty dressing or bathing?: No Independently performs ADLs?: Yes (appropriate for developmental age) Does the patient have difficulty walking or climbing stairs?: No Weakness of Legs: None Weakness of Arms/Hands: None  Permission Sought/Granted Permission sought to share information with : Case Manager                Emotional Assessment Appearance:: Appears stated age Attitude/Demeanor/Rapport: Gracious, Engaged Affect (typically observed): Pleasant, Appropriate Orientation: : Oriented to Self, Oriented to Place, Oriented to  Time, Oriented to Situation Alcohol / Substance Use: Alcohol Use Psych Involvement: No (comment)  Admission diagnosis:  Hepatitis [K75.9] RUQ abdominal pain [R10.11] Prolonged Q-T interval on ECG [R94.31] Lung nodule [R91.1]  Alcoholic hepatitis without ascites [K70.10] AKI (acute kidney injury) [facility]) [N17.9] Patient Active Problem List   Diagnosis Date Noted   Hepatitis 09/24/2021   PCP:  Patient, No Pcp Per (Inactive) Pharmacy:   [id number] [place]ann Sheen, [place] - [address] [place] [address] Phone: [phone] Fax:  [phone]     Social Determinants of Health (SDOH) Interventions    Readmission Risk Interventions No flowsheet data found.

## 2021-09-28 ENCOUNTER — Encounter
Admission: EM | Disposition: A | Discharge status: Home / Self Care | Payer: Self-pay | Source: Home / Self Care | Attending: Internal Medicine

## 2021-09-28 ENCOUNTER — Inpatient Hospital Stay: Payer: Self-pay | Admitting: Anesthesiology

## 2021-09-28 ENCOUNTER — Encounter: Payer: Self-pay | Admitting: Internal Medicine

## 2021-09-28 HISTORY — PX: COLONOSCOPY WITH PROPOFOL: SHX5780

## 2021-09-28 LAB — COMPREHENSIVE METABOLIC PANEL
ALT: 93 U/L — ABNORMAL HIGH (ref 0–44)
AST: 144 U/L — ABNORMAL HIGH (ref 15–41)
Albumin: 3.2 g/dL — ABNORMAL LOW (ref 3.5–5.0)
Alkaline Phosphatase: 81 U/L (ref 38–126)
Anion gap: 7 (ref 5–15)
BUN: 7 mg/dL (ref 6–20)
CO2: 30 mmol/L (ref 22–32)
Calcium: 8.8 mg/dL — ABNORMAL LOW (ref 8.9–10.3)
Chloride: 102 mmol/L (ref 98–111)
Creatinine, Ser: 1.07 mg/dL (ref 0.61–1.24)
GFR, Estimated: >60 mL/min (ref >60)
Glucose, Bld: 100 mg/dL — ABNORMAL HIGH (ref 70–99)
Potassium: 3.9 mmol/L (ref 3.5–5.1)
Sodium: 139 mmol/L (ref 135–145)
Total Bilirubin: 1.3 mg/dL — ABNORMAL HIGH (ref 0.3–1.2)
Total Protein: 6 g/dL — ABNORMAL LOW (ref 6.5–8.1)

## 2021-09-28 LAB — CBC WITH DIFFERENTIAL/PLATELET
Abs Immature Granulocytes: 0.03 10*3/uL (ref 0.00–0.07)
Basophils Absolute: 0 10*3/uL (ref 0.0–0.1)
Basophils Relative: 0 %
Eosinophils Absolute: 0.1 10*3/uL (ref 0.0–0.5)
Eosinophils Relative: 1 %
HCT: 41 % (ref 39.0–52.0)
Hemoglobin: 14.2 g/dL (ref 13.0–17.0)
Immature Granulocytes: 1 %
Lymphocytes Relative: 22 %
Lymphs Abs: 1.3 10*3/uL (ref 0.7–4.0)
MCH: 37 pg — ABNORMAL HIGH (ref 26.0–34.0)
MCHC: 34.6 g/dL (ref 30.0–36.0)
MCV: 106.8 fL — ABNORMAL HIGH (ref 80.0–100.0)
Monocytes Absolute: 0.7 10*3/uL (ref 0.1–1.0)
Monocytes Relative: 12 %
Neutro Abs: 3.7 10*3/uL (ref 1.7–7.7)
Neutrophils Relative %: 64 %
Platelets: 196 10*3/uL (ref 150–400)
RBC: 3.84 MIL/uL — ABNORMAL LOW (ref 4.22–5.81)
RDW: 11.5 % (ref 11.5–15.5)
WBC: 5.8 10*3/uL (ref 4.0–10.5)
nRBC: 0 % (ref 0.0–0.2)

## 2021-09-28 LAB — URINE CULTURE: Culture: NO GROWTH

## 2021-09-28 SURGERY — COLONOSCOPY WITH PROPOFOL
Anesthesia: General

## 2021-09-28 MED ORDER — PROPOFOL 500 MG/50ML IV EMUL
INTRAVENOUS | Status: AC
  Filled 2021-09-28: qty 50

## 2021-09-28 MED ORDER — FENTANYL CITRATE (PF) 100 MCG/2ML IJ SOLN
INTRAMUSCULAR | Status: DC | PRN
  Administered 2021-09-28: 50 ug via INTRAVENOUS

## 2021-09-28 MED ORDER — EPINEPHRINE 1 MG/10ML IJ SOSY
PREFILLED_SYRINGE | INTRAMUSCULAR | Status: DC | PRN
  Administered 2021-09-28: 0.3 mg via SUBCUTANEOUS

## 2021-09-28 MED ORDER — PROPOFOL 500 MG/50ML IV EMUL
INTRAVENOUS | Status: DC | PRN
  Administered 2021-09-28: 150 ug/kg/min via INTRAVENOUS

## 2021-09-28 MED ORDER — EPINEPHRINE 1 MG/10ML IJ SOSY
PREFILLED_SYRINGE | INTRAMUSCULAR | Status: AC
  Filled 2021-09-28: qty 10

## 2021-09-28 MED ORDER — SODIUM CHLORIDE 0.9 % IV SOLN: INTRAVENOUS | Status: DC

## 2021-09-28 MED ORDER — FENTANYL CITRATE (PF) 100 MCG/2ML IJ SOLN
INTRAMUSCULAR | Status: AC
  Filled 2021-09-28: qty 2

## 2021-09-28 MED ORDER — PANTOPRAZOLE SODIUM 40 MG IV SOLR
40.0000 mg | Freq: Two times a day (BID) | INTRAVENOUS | Status: DC
  Administered 2021-09-28 – 2021-09-29 (×3): 40 mg via INTRAVENOUS
  Filled 2021-09-28 (×3): qty 40

## 2021-09-28 NOTE — Care Plan (Signed)
Previous polypectomy site had a large amount of clot overlying area of polypectomy. Clot was removed with roth net and snare. Area had small amount of oozing so 5 clips were placed and area injected with epinephrine. No bleeding at end of procedure.  Recommendation:  - advance diet to regular - hemoglobins have been stable, check CBC in the morning - expect small amount of blood in stool due to residual blood in colon but monitor for any hemodynamically significant bleeding overnight  Will see patient again tomorrow.  Merlyn Lot MD, MPH

## 2021-09-28 NOTE — Anesthesia Postprocedure Evaluation (Signed)
Anesthesia Post Note  Patient: Nicholas Fletcher  Procedure(s) Performed: COLONOSCOPY WITH PROPOFOL  Patient location during evaluation: Endoscopy Anesthesia Type: General Level of consciousness: awake and alert Pain management: pain level controlled Vital Signs Assessment: post-procedure vital signs reviewed and stable Respiratory status: spontaneous breathing, nonlabored ventilation, respiratory function stable and patient connected to nasal cannula oxygen Cardiovascular status: blood pressure returned to baseline and stable Postop Assessment: no apparent nausea or vomiting Anesthetic complications: no   No notable events documented.   Last Vitals:  Vitals:   09/28/21 1551 09/28/21 1616  BP: (!) 136/101 (!) 139/93  Pulse:  74  Resp:    Temp:  36.7 C  SpO2:  100%    Last Pain:  Vitals:   09/28/21 1616  TempSrc: Oral  PainSc:                  Cleda Mccreedy Estevan Kersh

## 2021-09-28 NOTE — Progress Notes (Signed)
Patient expressed concerns about his blood loss s/p colonoscopy. Patient reports having a large black/red liquid stool every 2-3hrs that he flushes because "they told me yesterday they got what they needed." Asked patient to utilize hat in toilet next time he had a bowel movement and notify nursing staff, so that the volume could be recorded. Also, explained how we typically evaluate for severity of acute blood loss - labs, vitals, mentation etc., and patient verbalized understanding with teach back.

## 2021-09-28 NOTE — Op Note (Signed)
[facility] Patient Name: Nicholas Fletcher Procedure Date: 09/28/2021 2:39 PM MRN: [id number] Account #: [id number] Date of Birth: 1976-04-07 Admit Type: Inpatient Age: 45 Room: [facility] ENDO ROOM 3 Gender: Male Note Status: Finalized Instrument Name: Jasper Riling [id number] Procedure:             Colonoscopy Indications:           Treatment of bleeding from polypectomy site Providers:             Andrey Farmer MD, MD Referring MD:          No Local Md, MD (Referring MD) Medicines:             Monitored Anesthesia Care Complications:         No immediate complications. Procedure:             Pre-Anesthesia Assessment:                        - Prior to the procedure, a History and Physical was                         performed, and patient medications and allergies were                         reviewed. The patient is competent. The risks and                         benefits of the procedure and the sedation options and                         risks were discussed with the patient. All questions                         were answered and informed consent was obtained.                         Patient identification and proposed procedure were                         verified by the physician, the nurse, the anesthetist                         and the technician in the endoscopy suite. Mental                         Status Examination: alert and oriented. Airway                         Examination: normal oropharyngeal airway and neck                         mobility. Respiratory Examination: clear to                         auscultation. CV Examination: normal. Prophylactic                         Antibiotics: The patient does not require prophylactic  antibiotics. Prior Anticoagulants: The patient has                         taken no previous anticoagulant or antiplatelet                         agents. ASA Grade Assessment: III - A  patient with                         severe systemic disease. After reviewing the risks and                         benefits, the patient was deemed in satisfactory                         condition to undergo the procedure. The anesthesia                         plan was to use monitored anesthesia care (MAC).                         Immediately prior to administration of medications,                         the patient was re-assessed for adequacy to receive                         sedatives. The heart rate, respiratory rate, oxygen                         saturations, blood pressure, adequacy of pulmonary                         ventilation, and response to care were monitored                         throughout the procedure. The physical status of the                         patient was re-assessed after the procedure.                        After obtaining informed consent, the colonoscope was                         passed under direct vision. Throughout the procedure,                         the patient's blood pressure, pulse, and oxygen                         saturations were monitored continuously. The                         Colonoscope was introduced through the anus and                         advanced to the the ileocecal valve. The colonoscopy  was somewhat difficult due to excessive bleeding. The                         patient tolerated the procedure well. The quality of                         the bowel preparation was good. Findings:      The perianal and digital rectal examinations were normal.      Clotted blood was seen in the ascending colon, secondary to previous       polypectomy procedure. The large amount of clot was removed with roth       net and then snare. For hemostasis, five hemostatic clips were       successfully placed. There was no bleeding during, or at the end, of the       procedure. Area was successfully injected with 3 mL of  a 1:10,000       solution of epinephrine for hemostasis. Estimated blood loss: none.      Clips noted from previous pedunculated polypectomy site. Site was       healing and there was no evidence of bleeding.      The exam was otherwise without abnormality on direct and retroflexion       views. Impression:            - Bleeding in the ascending colon secondary to                         previous polypectomy. Clips were placed. Injected.                        - The examination was otherwise normal on direct and                         retroflexion views.                        - No specimens collected. Recommendation:        - Return patient to hospital ward for ongoing care.                        - Advance diet as tolerated.                        - Continue present medications.                        - Expect some residual bloody bowel movements but                         should improve now. Procedure Code(s):     --- Professional ---                        [phone], Colonoscopy, flexible; with control of                         bleeding, any method Diagnosis Code(s):     --- Professional ---                        Y69.485, Postprocedural hemorrhage of  a digestive                         system organ or structure following a digestive system                         procedure CPT copyright 2019 American Medical Association. All rights reserved. The codes documented in this report are preliminary and upon coder review may  be revised to meet current compliance requirements. Andrey Farmer MD, MD 09/28/2021 3:26:35 PM Number of Addenda: 0 Note Initiated On: 09/28/2021 2:39 PM Total Procedure Duration: 0 hours 31 minutes 16 seconds  Estimated Blood Loss:  Estimated blood loss: none.      [facility]

## 2021-09-28 NOTE — Progress Notes (Signed)
PROGRESS NOTE  Nicholas Fletcher  DOB: 08/10/1976  PCP: Patient, No Pcp Per (Inactive) RXV:[id number]  DOA: 09/24/2021  LOS: 2 days  Hospital Day: 5  Chief Complaint  Patient presents with   Emesis   Loss of appetite    Brief narrative: Nicholas Fletcher is a 45 y.o. male with a history of alcohol abuse. Patient presented to the ED on 12/4 with complaint of longstanding history of food getting stuck in the esophagus, regurgitation, recurrent vomiting, loss of appetite which has progressively worsened.  He states he has lost about 30 pounds in the last couple of months.  Drinks about 3-4 drinks of alcohol a day.  In the ED, he was afebrile, hemodynamically stable Labs with sodium 134, potassium 3.4, BUN/creatinine 13/1.8, AST elevated to 240, ALT elevated to 72, normal alk phos, total bilirubin elevated 3.9 with direct bilirubin slightly elevated at 1, likely 31 WBC count 10.3, hemoglobin 16.3 with MCV 102 Urinalysis with large amount of amber-colored urine with positive nitrite, rare bacteria CT chest abdomen pelvis showed marked hepatic steatosis, concern for focal colitis at the base of the cecum.  Right upper quadrant ultrasound showed fatty liver and gallbladder sludge without complicating factors. Patient was kept on observation under hospitalist service.  Subjective: Patient was seen and examined this morning.   Lying on bed.  Not in distress.  Continues to have red blood mixed and dark stool. GI planned to repeat colonoscopy today.  Assessment/Plan: Acute severe esophagitis BRBPR -Patient presented with severe regurgitation symptoms, dysphagia, poor oral intake, weight loss -EGD 12/6 showed LA grade D (severe) esophagitis with bleeding, that was biopsied.  Normal stomach and duodenum. -Patient has been started on Protonix twice daily.  GI recommended repeat EGD in 3 months to assess the healing of esophagitis. -Colonoscopy 12/6 showed many diverticulitis throughout the colon, 3  polyps were removed. -Post colonoscopy patient started having BRBPR as well as melena. -With continued for more than 24 hours. -GI plans to repeat colonoscopy today.  Persistent hiccups -Improving with as needed baclofen.  Abnormal urinalysis -Urinalysis with positive nitrate.  Empirically started on 3-day course of IV Rocephin.    Macrocytosis -Likely due to alcohol use.  Anabela with normal folate, thiamine Yenty Bloch and ferritin level. Recent Labs    09/26/21 0413 09/27/21 0227 09/27/21 0616 09/27/21 1015 09/28/21 0457  HGB 13.1 14.4 12.9* 13.8 14.2  MCV 102.5* 105.2* 105.0* 106.7* 106.8*  VITAMINB12  --   --  602  --   --   FOLATE  --   --  8.3  --   --   FERRITIN  --   --  718*  --   --   TIBC  --   --  197*  --   --   IRON  --   --  31*  --   --   RETICCTPCT  --   --  1.5  --   --     Acute hepatitis Marked hepatic steatosis Chronic alcoholism -Presented with elevated liver enzymes in the setting of chronic alcohol use -CT abdomen showed marked hepatic steatosis. -Gradually downtrending AST/ALT/bilirubin. -States he drinks about 4 drinks every night.  Did not have withdrawal symptoms in the hospital.  Counseled to quit alcohol. Recent Labs  Lab 09/24/21 1422 09/25/21 0737 09/26/21 0413 09/27/21 0227 09/28/21 0457  AST 240* 257* 297* 239* 144*  ALT 72* 76* 90* 98* 93*  ALKPHOS 92 80 82 87 81  BILITOT 3.9* 3.8* 3.0* 1.7*  1.3*  PROT 6.7 6.4* 5.1* 5.7* 6.0*  ALBUMIN 4.0 3.6 3.0* 3.1* 3.2*   AKI -Presented with creatinine elevated to 1.8.  Creatinine was normal at 0.74 last year. -Improved with IV fluid. Recent Labs    09/24/21 1422 09/25/21 0737 09/26/21 0413 09/27/21 0227 09/28/21 0457  BUN $Re'13 18 13 14 7  'mxh$ CREATININE 1.80* 1.55* 1.15 1.21 1.07   Prolonged QTC Hypokalemia/hypomagnesemia/hypophosphatemia -QTC prolonged to 579 ms on EKG on admission. -Potassium and phosphorus levels were low and were repleted. -Repeat EKG 12/6 with QTC improvement to 435  ms.  Continue to monitor electrolytes.   Recent Labs  Lab 09/24/21 1422 09/24/21 1725 09/25/21 0737 09/26/21 0413 09/27/21 0227 09/28/21 0457  K 3.4*  --  3.6 3.6 4.1 3.9  MG 1.7  --   --  2.6*  --   --   PHOS  --  1.4*  --  1.8*  --   --      Mobility: Encourage ambulation Living condition: Lives at home Goals of care:   Code Status: Full Code  Nutritional status: Body mass index is 23.73 kg/m.  Nutrition Problem: Inadequate oral intake Etiology: altered GI function Signs/Symptoms: NPO status Diet:  Diet Order             Diet NPO time specified  Diet effective midnight                  DVT prophylaxis:  Place TED hose Start: 09/24/21 2016   Antimicrobials: None Fluid: Stop IV hydration Consultants: GI Family Communication: None at bedside  Status is: Observation  Continue in-hospital care because: Needs further 24-hour monitoring because of BRBPR Level of care: Med-Surg   Dispo: The patient is from: Home              Anticipated d/c is to: Likely home tomorrow              Patient currently is not medically stable to d/c.   Difficult to place patient No     Infusions:     Scheduled Meds:  folic acid  1 mg Oral Daily   multivitamin with minerals  1 tablet Oral Daily   pantoprazole (PROTONIX) IV  40 mg Intravenous Q12H   thiamine  100 mg Oral Daily   Or   thiamine  100 mg Intravenous Daily    PRN meds: baclofen   Antimicrobials: Anti-infectives (From admission, onward)    Start     Dose/Rate Route Frequency Ordered Stop   09/26/21 1000  cefTRIAXone (ROCEPHIN) 1 g in sodium chloride 0.9 % 100 mL IVPB        1 g 200 mL/hr over 30 Minutes Intravenous Every 24 hours 09/26/21 0812 09/28/21 0956       Objective: Vitals:   09/28/21 0848 09/28/21 1232  BP: (!) 140/99 (!) 157/98  Pulse: 82 81  Resp: 19 19  Temp: 97.7 F (36.5 C) 98.1 F (36.7 C)  SpO2: 99% 100%    Intake/Output Summary (Last 24 hours) at 09/28/2021 1352 Last data  filed at 09/27/2021 2353 Gross per 24 hour  Intake 1060 ml  Output --  Net 1060 ml   Filed Weights   09/24/21 1419  Weight: 79.4 kg   Weight change:  Body mass index is 23.73 kg/m.   Physical Exam: General exam: Pleasant, middle-aged African-American male.  In mild to moderate distress because of persistent hiccups Skin: No rashes, lesions or ulcers. HEENT: Atraumatic, normocephalic, no obvious bleeding  Lungs: Clear to auscultation bilaterally CVS: Regular rate and rhythm, no murmur GI/Abd soft, continues to have mild epigastric tenderness, nondistended, bowel sound present CNS: Alert, awake, oriented x3 Psychiatry: Mood appropriate Extremities: No pedal edema, no calf tenderness  Data Review: I have personally reviewed the laboratory data and studies available.  F/u labs ordered Unresulted Labs (From admission, onward)     Start     Ordered   09/29/21 0500  CBC with Differential/Platelet  Daily,   R     Question:  Specimen collection method  Answer:  Lab=Lab collect   09/28/21 0856   09/29/21 0500  Comprehensive metabolic panel  Daily,   R     Question:  Specimen collection method  Answer:  Lab=Lab collect   09/28/21 0856   09/27/21 0219  CBC  Now then every 4 hours,   TIMED      09/27/21 0218   09/25/21 0500  CBC  Daily,   STAT      09/24/21 2017   09/25/21 0500  Comprehensive metabolic panel  Daily,   STAT      09/24/21 2017            Signed, Terrilee Croak, MD [facility] 09/28/2021

## 2021-09-28 NOTE — Progress Notes (Signed)
Nutrition Follow-up  DOCUMENTATION CODES:   Not applicable  INTERVENTION:   -RD will follow for diet advancement and add supplements as appropriate   NUTRITION DIAGNOSIS:   Inadequate oral intake related to altered GI function as evidenced by NPO status.  Ongoing  GOAL:   Patient will meet greater than or equal to 90% of their needs  Progressing   MONITOR:   PO intake, Supplement acceptance, Diet advancement, Labs, Weight trends, Skin, I & O's  REASON FOR ASSESSMENT:   Malnutrition Screening Tool, Consult Assessment of nutrition requirement/status  ASSESSMENT:   Nicholas Fletcher is a 44 year old male with a history of alcohol abuse and presents to ED with persistent, chronic worsening emesis and loss of appetite.  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 Pt is NPO for repeat colonoscopy today.   Spoke with pt at bedside, who was pleasant and in good spirits today. He reports that he has not eaten anything in 3 days due to multiple tests and procedures. Pt reports decreased appetite and weight loss over the past year. He reports that intake has gotten progressively worse since getting the COVID-19 vaccine a year ago; he shares that this suppressed his appetite progressively. Prior to a year ago, he had a very good appetite and would consume 3 large meals per day. Pt's appetite progressively got worse to the point that he was only able to consume a half of a hamburger daily. Pt also consumes 3-4 alcoholic beverages daily.  Per pt, his UBW is around 245#. He estimates he has lost 50# over the past year. Reviewed wt hx; pt was 210# on 07/21/2019.  Pt understand rationale for NPO order. Pt amenable to supplements and reports "I am willing to do anything you tell me to do so I can get  better".   Medications reviewed and include folic acid and thiamine.   Labs reviewed.   NUTRITION - FOCUSED PHYSICAL EXAM:  Flowsheet Row Most Recent Value  Orbital Region No depletion  Upper Arm Region Mild depletion  Thoracic and Lumbar Region No depletion  Buccal Region No depletion  Temple Region No depletion  Clavicle Bone Region Mild depletion  Clavicle and Acromion Bone Region Mild depletion  Scapular Bone Region Mild depletion  Dorsal Hand No depletion  Patellar Region Mild depletion  Anterior Thigh Region Mild depletion  Posterior Calf Region Mild depletion  Edema (RD Assessment) None  Hair Reviewed  Eyes Reviewed  Mouth Reviewed  Skin Reviewed  Nails Reviewed       Diet Order:   Diet Order             Diet NPO time specified  Diet effective midnight                   EDUCATION NEEDS:   Not appropriate for education at this time  Skin:  Skin Assessment: Reviewed RN Assessment  Last BM:  09/27/21  Height:   Ht Readings from Last 1 Encounters:  09/24/21 6' (1.829 m)    Weight:   Wt Readings from Last 1 Encounters:  09/24/21 79.4 kg    Ideal Body Weight:  80.9 kg  BMI:  Body mass index is 23.73 kg/m.  Estimated Nutritional Needs:   Kcal:  2350-2550  Protein:  120-135 grams  Fluid:  >  [place], RD, LDN, [place] Registered Dietitian II Certified Diabetes Care and Education Specialist Please refer to [facility] for RD and/or RD on-call/weekend/after hours pager

## 2021-09-28 NOTE — Anesthesia Procedure Notes (Signed)
Date/Time: 09/28/2021 2:42 PM Performed by: Tonia Ghent Pre-anesthesia Checklist: Patient identified, Emergency Drugs available, Suction available, Patient being monitored and Timeout performed Patient Re-evaluated:Patient Re-evaluated prior to induction Oxygen Delivery Method: Nasal cannula Preoxygenation: Pre-oxygenation with 100% oxygen Induction Type: IV induction Placement Confirmation: positive ETCO2 and CO2 detector

## 2021-09-28 NOTE — Anesthesia Preprocedure Evaluation (Addendum)
Anesthesia Evaluation  Patient identified by MRN, date of birth, ID band Patient awake    Reviewed: Allergy & Precautions, H&P , NPO status , Patient's Chart, lab work & pertinent test results  History of Anesthesia Complications Negative for: history of anesthetic complications  Airway Mallampati: III  TM Distance: >3 FB     Dental no notable dental hx.    Pulmonary neg sleep apnea, neg COPD, Current Smoker,    Pulmonary exam normal        Cardiovascular (-) angina(-) Past MI and (-) Cardiac Stents negative cardio ROS Normal cardiovascular exam(-) dysrhythmias   EKG: NSR Right atrial enlargement RVH   Neuro/Psych negative neurological ROS  negative psych ROS   GI/Hepatic negative GI ROS, (+) Cirrhosis     substance abuse  alcohol use, Hepatitis -Acute severe esophagitis -Patient presented with severe regurgitation symptoms, dysphagia, poor oral intake, weight loss -EGD 12/6 showed LA grade D (severe) esophagitis with bleeding, that was biopsied.  -On protonix -Colonoscopy 12/6 showed many diverticulitis throughout the colon, 3 polyps were removed  Now having multiple episodes of bowel movement with dark stool mixed with fresh blood.  -Thought to be related to severe esophagitis with acute bleeding as well as post-polypectomy status.  Posted for colonoscopy   Endo/Other  negative endocrine ROS  Renal/GU negative Renal ROS  negative genitourinary   Musculoskeletal   Abdominal   Peds  Hematology negative hematology ROS (+)   Anesthesia Other Findings History reviewed. No pertinent past medical history.  Past Surgical History: 09/26/2021: COLONOSCOPY WITH PROPOFOL; N/A     Comment:  Procedure: COLONOSCOPY WITH PROPOFOL;  Surgeon:               Regis Bill, MD;  Location: [facility] ENDOSCOPY;                Service: Endoscopy;  Laterality: N/A; 09/26/2021: ESOPHAGOGASTRODUODENOSCOPY (EGD) WITH PROPOFOL;  N/A     Comment:  Procedure: ESOPHAGOGASTRODUODENOSCOPY (EGD) WITH               PROPOFOL;  Surgeon: Regis Bill, MD;  Location:               [facility] ENDOSCOPY;  Service: Endoscopy;  Laterality: N/A; No date: TONSILLECTOMY  BMI    Body Mass Index: 23.25 kg/m      Reproductive/Obstetrics negative OB ROS                         Anesthesia Physical Anesthesia Plan  ASA: 3  Anesthesia Plan: General   Post-op Pain Management:    Induction:   PONV Risk Score and Plan: Propofol infusion and TIVA  Airway Management Planned: Simple Face Mask  Additional Equipment:   Intra-op Plan:   Post-operative Plan:   Informed Consent: I have reviewed the patients History and Physical, chart, labs and discussed the procedure including the risks, benefits and alternatives for the proposed anesthesia with the patient or authorized representative who has indicated his/her understanding and acceptance.     Dental Advisory Given  Plan Discussed with: Anesthesiologist, CRNA and Surgeon  Anesthesia Plan Comments: (No emesis for past 5 days.  Denies nausea or abdominal distension.  Reasonable to proceed with IVGA native airway, GETA backup.)       Anesthesia Quick Evaluation

## 2021-09-28 NOTE — Transfer of Care (Signed)
Immediate Anesthesia Transfer of Care Note  Patient: Nicholas Fletcher  Procedure(s) Performed: COLONOSCOPY WITH PROPOFOL  Patient Location: PACU  Anesthesia Type:General  Level of Consciousness: awake and sedated  Airway & Oxygen Therapy: Patient Spontanous Breathing and Patient connected to nasal cannula oxygen  Post-op Assessment: Report given to RN and Post -op Vital signs reviewed and stable  Post vital signs: Reviewed and stable  Last Vitals:  Vitals Value Taken Time  BP    Temp    Pulse    Resp    SpO2      Last Pain:  Vitals:   09/28/21 1422  TempSrc:   PainSc: 0-No pain      Patients Stated Pain Goal: 0 (09/27/21 2133)  Complications: No notable events documented.

## 2021-09-29 ENCOUNTER — Encounter: Payer: Self-pay | Admitting: Gastroenterology

## 2021-09-29 LAB — CBC WITH DIFFERENTIAL/PLATELET
Abs Immature Granulocytes: 0.03 10*3/uL (ref 0.00–0.07)
Basophils Absolute: 0 10*3/uL (ref 0.0–0.1)
Basophils Relative: 0 %
Eosinophils Absolute: 0.1 10*3/uL (ref 0.0–0.5)
Eosinophils Relative: 2 %
HCT: 30.8 % — ABNORMAL LOW (ref 39.0–52.0)
Hemoglobin: 10.7 g/dL — ABNORMAL LOW (ref 13.0–17.0)
Immature Granulocytes: 1 %
Lymphocytes Relative: 20 %
Lymphs Abs: 1.1 10*3/uL (ref 0.7–4.0)
MCH: 36.5 pg — ABNORMAL HIGH (ref 26.0–34.0)
MCHC: 34.7 g/dL (ref 30.0–36.0)
MCV: 105.1 fL — ABNORMAL HIGH (ref 80.0–100.0)
Monocytes Absolute: 0.8 10*3/uL (ref 0.1–1.0)
Monocytes Relative: 15 %
Neutro Abs: 3.6 10*3/uL (ref 1.7–7.7)
Neutrophils Relative %: 62 %
Platelets: 190 10*3/uL (ref 150–400)
RBC: 2.93 MIL/uL — ABNORMAL LOW (ref 4.22–5.81)
RDW: 11.4 % — ABNORMAL LOW (ref 11.5–15.5)
WBC: 5.7 10*3/uL (ref 4.0–10.5)
nRBC: 0 % (ref 0.0–0.2)

## 2021-09-29 LAB — COMPREHENSIVE METABOLIC PANEL
ALT: 81 U/L — ABNORMAL HIGH (ref 0–44)
AST: 122 U/L — ABNORMAL HIGH (ref 15–41)
Albumin: 2.8 g/dL — ABNORMAL LOW (ref 3.5–5.0)
Alkaline Phosphatase: 64 U/L (ref 38–126)
Anion gap: 4 — ABNORMAL LOW (ref 5–15)
BUN: 8 mg/dL (ref 6–20)
CO2: 29 mmol/L (ref 22–32)
Calcium: 8.3 mg/dL — ABNORMAL LOW (ref 8.9–10.3)
Chloride: 105 mmol/L (ref 98–111)
Creatinine, Ser: 1.24 mg/dL (ref 0.61–1.24)
GFR, Estimated: >60 mL/min (ref >60)
Glucose, Bld: 100 mg/dL — ABNORMAL HIGH (ref 70–99)
Potassium: 3.9 mmol/L (ref 3.5–5.1)
Sodium: 138 mmol/L (ref 135–145)
Total Bilirubin: 0.7 mg/dL (ref 0.3–1.2)
Total Protein: 4.9 g/dL — ABNORMAL LOW (ref 6.5–8.1)

## 2021-09-29 LAB — SURGICAL PATHOLOGY

## 2021-09-29 MED ORDER — ENSURE ENLIVE PO LIQD: 237.0000 mL | Freq: Three times a day (TID) | ORAL | Status: DC

## 2021-09-29 MED ORDER — PANTOPRAZOLE SODIUM 40 MG PO TBEC: 40.0000 mg | DELAYED_RELEASE_TABLET | Freq: Two times a day (BID) | ORAL | 2 refills | Status: DC

## 2021-09-29 MED ORDER — SUCRALFATE 1 GM/10ML PO SUSP: 1.0000 g | Freq: Four times a day (QID) | ORAL | 2 refills | Status: DC

## 2021-09-29 NOTE — Progress Notes (Signed)
PT AVS reviewed and pt verbalized understanding of all teaching. Pt going home with wife as transportation and all pt belongings are in pt possession.

## 2021-09-29 NOTE — Discharge Summary (Signed)
Physician Discharge Summary  Nicholas Fletcher YHC:[id number] DOB: 1976/03/07 DOA: 09/24/2021  PCP: Patient, No Pcp Per (Inactive)  Admit date: 09/24/2021 Discharge date: 09/29/2021  Admitted From: Home Discharge disposition: Home   Code Status: Full Code   Discharge Diagnosis:   Principal Problem:   Hepatitis   Chief Complaint  Patient presents with   Emesis   Loss of appetite    Brief narrative: Nicholas Fletcher is a 45 y.o. male with a history of alcohol abuse. Patient presented to the ED on 12/4 with complaint of longstanding history of food getting stuck in the esophagus, regurgitation, recurrent vomiting, loss of appetite which has progressively worsened.  He states he has lost about 30 pounds in the last couple of months.  Drinks about 3-4 drinks of alcohol a day.  In the ED, he was afebrile, hemodynamically stable Labs with sodium 134, potassium 3.4, BUN/creatinine 13/1.8, AST elevated to 240, ALT elevated to 72, normal alk phos, total bilirubin elevated 3.9 with direct bilirubin slightly elevated at 1, likely 31 WBC count 10.3, hemoglobin 16.3 with MCV 102 Urinalysis with large amount of amber-colored urine with positive nitrite, rare bacteria CT chest abdomen pelvis showed marked hepatic steatosis, concern for focal colitis at the base of the cecum.  Right upper quadrant ultrasound showed fatty liver and gallbladder sludge without complicating factors. Patient was kept on observation under hospitalist service.  Subjective: Patient was seen and examined this morning.   Lying on bed.  Not in distress.  Still passing some red blood in stool.  Probably clearing previous bleeding. Cleared by GI for discharge today.  Hospital course: Acute severe esophagitis -Patient presented with severe regurgitation symptoms, dysphagia, poor oral intake, weight loss -EGD 12/6 showed LA grade D (severe) esophagitis with bleeding, that was biopsied.  Normal stomach and duodenum. -Patient  has been started on Protonix twice daily.  GI recommended repeat EGD in 3 months to assess the healing of esophagitis. -Avoid NSAIDs, alcohol.    BRBPR -Colonoscopy 12/6 showed many diverticulitis throughout the colon, 3 polyps were removed. -Post colonoscopy patient started having BRBPR as well as melena. -He had repeat colonoscopy done on 12/8.  It showed a large amount of clot overlying area of polypectomy.  Per GI note, clot was removed with Jabier Mutton net and snare.  The area had small amount of oozing so 5 clips were placed and epinephrine was injected.  His diet was advanced to regular.  Able to tolerate.  As expected, he is still passing some blood in the stool.  Hemodynamically stable.  Cleared by GI for discharge today.    Acute hepatitis Marked hepatic steatosis Chronic alcoholism -Presented with elevated liver enzymes in the setting of chronic alcohol use -CT abdomen showed marked hepatic steatosis. -Gradually downtrending AST/ALT/bilirubin. -States he drinks about 4 drinks every night.  Did not have withdrawal symptoms in the hospital.  Counseled to quit alcohol.  He commits to stopping alcohol use. -Counseled to quit alcohol.  Seen by nutritionist. Recent Labs  Lab 09/25/21 0737 09/26/21 0413 09/27/21 0227 09/28/21 0457 09/29/21 0325  AST 257* 297* 239* 144* 122*  ALT 76* 90* 98* 93* 81*  ALKPHOS 80 82 87 81 64  BILITOT 3.8* 3.0* 1.7* 1.3* 0.7  PROT 6.4* 5.1* 5.7* 6.0* 4.9*  ALBUMIN 3.6 3.0* 3.1* 3.2* 2.8*   Persistent hiccups -Improved with as needed baclofen.  Abnormal urinalysis -Urinalysis with positive nitrate.  Empirically treated with 3-day course of IV Rocephin.    Macrocytosis -MCV  over 100.  Likely due to alcohol use.  Normal vitamin B12 and folate levels.  AKI -Presented with creatinine elevated to 1.8.  Creatinine was normal at 0.74 last year. -Improved with IV fluid. Recent Labs    09/24/21 1422 09/25/21 0737 09/26/21 0413 09/27/21 0227  09/28/21 0457 09/29/21 0325  BUN _0 CREATININE 1.80* 1.55* 1.15 1.21 1.07 1.24   Prolonged QTC Hypokalemia/hypomagnesemia/hypophosphatemia -QTC prolonged to 579 ms on EKG on admission. -Potassium and phosphorus levels were low and were repleted. -Repeat EKG 12/6 with QTC improvement to 435 ms.  Continue to monitor electrolytes.   Recent Labs  Lab 09/24/21 1422 09/24/21 1725 09/25/21 0737 09/26/21 0413 09/27/21 0227 09/28/21 0457 09/29/21 0325  K 3.4*  --  3.6 3.6 4.1 3.9 3.9  MG 1.7  --   --  2.6*  --   --   --   PHOS  --  1.4*  --  1.8*  --   --   --      Mobility: Encourage ambulation Living condition: Lives at home Goals of care:   Code Status: Full Code  Nutritional status: Body mass index is 23.25 kg/m.  Nutrition Problem: Inadequate oral intake Etiology: altered GI function Signs/Symptoms: NPO status   Discharge Medications:   Allergies as of 09/29/2021   No Known Allergies      Medication List     STOP taking these medications    naproxen 500 MG tablet Commonly known as: NAPROSYN   tiZANidine 4 MG tablet Commonly known as: Zanaflex       TAKE these medications    oxyCODONE-acetaminophen 5-325 MG tablet Commonly known as: PERCOCET/ROXICET Take 1 tablet by mouth every 4 (four) hours as needed for severe pain.   pantoprazole 40 MG tablet Commonly known as: Protonix Take 1 tablet (40 mg total) by mouth 2 (two) times daily before a meal.   sucralfate 1 GM/10ML suspension Commonly known as: Carafate Take 10 mLs (1 g total) by mouth 4 (four) times daily.        Wound care:     Discharge Instructions:   Discharge Instructions     Call MD for:  difficulty breathing, headache or visual disturbances   Complete by: As directed    Call MD for:  extreme fatigue   Complete by: As directed    Call MD for:  hives   Complete by: As directed    Call MD for:  persistant dizziness or light-headedness   Complete by: As directed     Call MD for:  persistant nausea and vomiting   Complete by: As directed    Call MD for:  severe uncontrolled pain   Complete by: As directed    Call MD for:  temperature >100.4   Complete by: As directed    Diet general   Complete by: As directed    Discharge instructions   Complete by: As directed    General discharge instructions:  Follow with Primary MD Patient, No Pcp Per (Inactive) in 7 days   Get CBC/BMP checked in next visit within 1 week by PCP or SNF MD. (We routinely change or add medications that can affect your baseline labs and fluid status, therefore we recommend that you get the mentioned basic workup next visit with your PCP, your PCP may decide not to get them or add new tests based on their clinical decision)  On your next visit with your PCP, please get your medicines  reviewed and adjusted.  Please request your PCP  to go over all hospital tests, procedures, radiology results at the follow up, please get all Hospital records sent to your PCP by signing hospital release before you go home.  Activity: As tolerated with Full fall precautions use walker/cane & assistance as needed  Avoid using any recreational substances like cigarette, tobacco, alcohol, or non-prescribed drug.  If you experience worsening of your admission symptoms, develop shortness of breath, life threatening emergency, suicidal or homicidal thoughts you must seek medical attention immediately by calling 911 or calling your MD immediately  if symptoms less severe.  You must read complete instructions/literature along with all the possible adverse reactions/side effects for all the medicines you take and that have been prescribed to you. Take any new medicine only after you have completely understood and accepted all the possible adverse reactions/side effects.   Do not drive, operate heavy machinery, perform activities at heights, swimming or participation in water activities or provide baby sitting  services if your were admitted for syncope or siezures until you have seen by Primary MD or a Neurologist and advised to do so again.  Do not drive when taking Pain medications.  Do not take more than prescribed Pain, Sleep and Anxiety Medications  Wear Seat belts while driving.  Please note You were cared for by a hospitalist during your hospital stay. If you have any questions about your discharge medications or the care you received while you were in the hospital after you are discharged, you can call the unit and asked to speak with the hospitalist on call if the hospitalist that took care of you is not available. Once you are discharged, your primary care physician will handle any further medical issues. Please note that NO REFILLS for any discharge medications will be authorized once you are discharged, as it is imperative that you return to your primary care physician (or establish a relationship with a primary care physician if you do not have one) for your aftercare needs so that they can reassess your need for medications and monitor your lab values.   Increase activity slowly   Complete by: As directed        Follow ups:    Follow-up [facility].   Contact information: [place] [address] [phone]        Lesly Rubenstein, MD Follow up.   Specialty: Gastroenterology Contact information: [place] [address] [phone]                 Discharge Exam:   Vitals:   09/29/21 0420 09/29/21 0429 09/29/21 0819 09/29/21 1153  BP: 124/81 (!) 91/43 (!) 148/88 (!) 146/80  Pulse: 75 99 76 90  Resp: _0 Temp: 98 F (36.7 C) 98 F (36.7 C) 98.4 F (36.9 C) 98 F (36.7 C)  TempSrc:   Oral Oral  SpO2: 100% 98% 100% 100%  Weight:      Height:        Body mass index is 23.25 kg/m.  General exam: Pleasant, middle-aged African-American  male.  Not in distress Skin: No rashes, lesions or ulcers. HEENT: Atraumatic, normocephalic, no obvious bleeding Lungs: Clear to auscultation bilaterally CVS: Regular rate and rhythm, no murmur GI/Abd soft, no tenderness, nondistended, bowel sound present CNS: Alert, awake, oriented x3 Psychiatry: Mood appropriate Extremities: No pedal edema, no calf  tenderness  Time coordinating discharge: 35 minutes   The results of significant diagnostics from this hospitalization (including imaging, microbiology, ancillary and laboratory) are listed below for reference.    Procedures and Diagnostic Studies:   DG Chest 2 View  Result Date: 09/24/2021 CLINICAL DATA:  Shortness of breath. Upper abdominal pain. Emesis and loss of appetite. EXAM: CHEST - 2 VIEW COMPARISON:  03/21/2005 FINDINGS: 9 mm density the left lung base at the intersection of the left eighth and tenth ribs, cannot exclude pulmonary nodule. Not well corroborated on the lateral projection. The lungs appear otherwise clear. Cardiac and mediastinal margins appear normal. No blunting of the costophrenic angles. IMPRESSION: 1. 9 mm density projects over the left lung base. Cannot exclude pulmonary nodule. Chest CT recommended for further characterization. Electronically Signed   By: Van Clines M.D.   On: 09/24/2021 15:35   CT CHEST ABDOMEN PELVIS W CONTRAST  Result Date: 09/24/2021 CLINICAL DATA:  Evaluate left lower lobe nodule and hepatic biliary pathology. Weight loss, progressive hiccups and postprandial emesis. EXAM: CT CHEST, ABDOMEN, AND PELVIS WITH CONTRAST TECHNIQUE: Multidetector CT imaging of the chest, abdomen and pelvis was performed following the standard protocol during bolus administration of intravenous contrast. CONTRAST:  181m OMNIPAQUE IOHEXOL 300 MG/ML  SOLN COMPARISON:  None. FINDINGS: CT CHEST FINDINGS Cardiovascular: No significant vascular findings. Normal heart size. No pericardial effusion. Mediastinum/Nodes:  No evidence of lymphadenopathy. Diffusely thickened esophagus. Lungs/Pleura: Lungs are clear. No pleural effusion or pneumothorax. Musculoskeletal: No chest wall mass or suspicious bone lesions identified. CT ABDOMEN PELVIS FINDINGS Hepatobiliary: Marked hepatic steatosis.  Normal gallbladder. Pancreas: Unremarkable. No pancreatic ductal dilatation or surrounding inflammatory changes. Spleen: Normal in size without focal abnormality. Adrenals/Urinary Tract: Adrenal glands are unremarkable. Kidneys are normal, without renal calculi, focal lesion, or hydronephrosis. Bladder is unremarkable. Stomach/Bowel: Diffuse mucosal thickening of the stomach. Normal small bowel. Normal appendix. Scattered colonic diverticulosis, particularly prominent in the cecum. Circumferential thickening of the base of the cecum. Vascular/Lymphatic: No significant vascular findings are present. No enlarged abdominal or pelvic lymph nodes. Reproductive: Prostate is unremarkable. Other: No abdominal wall hernia or abnormality. No abdominopelvic ascites. Musculoskeletal: No acute or significant osseous findings. IMPRESSION: 1. Marked hepatic steatosis. 2. Circumferential thickening of the base of the cecum. This may represent an area of focal colitis, especially given the presence of cecal diverticulosis, however underlying malignancy cannot be excluded. 3. Diffuse mucosal thickening of the stomach. 4. Scattered colonic diverticulosis, particularly prominent in the cecum. 5. No evidence of pulmonary nodules. 6. Correlation with upper GI endoscopy and colonoscopy may be considered. Electronically Signed   By: DFidela SalisburyM.D.   On: 09/24/2021 18:16   [place]ABDOMEN LIMITED RUQ (LIVER/GB)  Result Date: 09/24/2021 CLINICAL DATA:  Right upper quadrant pain for 2 days EXAM: ULTRASOUND ABDOMEN LIMITED RIGHT UPPER QUADRANT COMPARISON:  CT from earlier in the same day. FINDINGS: Gallbladder: Gallbladder is well distended with dependent sludge.  No gallstones are seen. No wall thickening or pericholecystic fluid is noted. Common bile duct: Diameter: 3 mm. Liver: Diffusely increased in echogenicity consistent with fatty infiltration. No focal mass is noted. Portal vein is patent on color Doppler imaging with normal direction of blood flow towards the liver. Other: None. IMPRESSION: Fatty liver. Gallbladder sludge without complicating factors. Electronically Signed   By: MInez CatalinaM.D.   On: 09/24/2021 19:56     Labs:   Basic Metabolic Panel: Recent Labs  Lab 09/24/21 1422 09/24/21 1725 09/25/21 0161012/06/22 0413 09/27/21 09604  09/28/21 0457 09/29/21 0325  NA 134*  --  138 137 136 139 138  K 3.4*  --  3.6 3.6 4.1 3.9 3.9  CL 93*  --  99 103 101 102 105  CO2 25  --  _0 GLUCOSE 164*  --  88 87 102* 100* 100*  BUN 13  --  _1 CREATININE 1.80*  --  1.55* 1.15 1.21 1.07 1.24  CALCIUM 9.0  --  8.8* 8.0* 8.5* 8.8* 8.3*  MG 1.7  --   --  2.6*  --   --   --   PHOS  --  1.4*  --  1.8*  --   --   --    GFR Estimated Creatinine Clearance: 80.8 mL/min (by C-G formula based on SCr of 1.24 mg/dL). Liver Function Tests: Recent Labs  Lab 09/25/21 0737 09/26/21 0413 09/27/21 0227 09/28/21 0457 09/29/21 0325  AST 257* 297* 239* 144* 122*  ALT 76* 90* 98* 93* 81*  ALKPHOS 80 82 87 81 64  BILITOT 3.8* 3.0* 1.7* 1.3* 0.7  PROT 6.4* 5.1* 5.7* 6.0* 4.9*  ALBUMIN 3.6 3.0* 3.1* 3.2* 2.8*   Recent Labs  Lab 09/24/21 1422  LIPASE 31   No results for input(s): AMMONIA in the last 168 hours. Coagulation profile Recent Labs  Lab 09/24/21 1721 09/25/21 0737  INR 0.9 0.9    CBC: Recent Labs  Lab 09/27/21 0227 09/27/21 0616 09/27/21 1015 09/28/21 0457 09/29/21 0325  WBC 5.4 4.2 4.7 5.8 5.7  NEUTROABS 3.6  --   --  3.7 3.6  HGB 14.4 12.9* 13.8 14.2 10.7*  HCT 40.5 37.5* 39.8 41.0 30.8*  MCV 105.2* 105.0* 106.7* 106.8* 105.1*  PLT 148* 138* 149* 196 190   Cardiac Enzymes: No results for input(s):  CKTOTAL, CKMB, CKMBINDEX, TROPONINI in the last 168 hours. BNP: Invalid input(s): POCBNP CBG: No results for input(s): GLUCAP in the last 168 hours. D-Dimer No results for input(s): DDIMER in the last 72 hours. Hgb A1c No results for input(s): HGBA1C in the last 72 hours. Lipid Profile No results for input(s): CHOL, HDL, LDLCALC, TRIG, CHOLHDL, LDLDIRECT in the last 72 hours. Thyroid function studies No results for input(s): TSH, T4TOTAL, T3FREE, THYROIDAB in the last 72 hours.  Invalid input(s): FREET3 Anemia work up Recent Labs    09/27/21 0616  VITAMINB12 602  FOLATE 8.3  FERRITIN 718*  TIBC 197*  IRON 31*  RETICCTPCT 1.5   Microbiology Recent Results (from the past 240 hour(s))  Resp Panel by RT-PCR (Flu A&B, Covid) Nasopharyngeal Swab     Status: None   Collection Time: 09/24/21  5:21 PM   Specimen: Nasopharyngeal Swab; Nasopharyngeal(NP) swabs in vial transport medium  Result Value Ref Range Status   SARS Coronavirus 2 by RT PCR NEGATIVE NEGATIVE Final    Comment: (NOTE) SARS-CoV-2 target nucleic acids are NOT DETECTED.  The SARS-CoV-2 RNA is generally detectable in upper respiratory specimens during the acute phase of infection. The lowest concentration of SARS-CoV-2 viral copies this assay can detect is 138 copies/mL. A negative result does not preclude SARS-Cov-2 infection and should not be used as the sole basis for treatment or other patient management decisions. A negative result may occur with  improper specimen collection/handling, submission of specimen other than nasopharyngeal swab, presence of viral mutation(s) within the areas targeted by this assay, and inadequate number of viral copies(<138 copies/mL). A negative result must be combined with clinical  observations, patient history, and epidemiological information. The expected result is Negative.  Fact Sheet for Patients:  EntrepreneurPulse.com.au  Fact Sheet for Healthcare  Providers:  IncredibleEmployment.be  This test is no t yet approved or cleared by the [place] FDA and  has been authorized for detection and/or diagnosis of SARS-CoV-2 by FDA under an Emergency Use Authorization (EUA). This EUA will remain  in effect (meaning this test can be used) for the duration of the COVID-19 declaration under Section 564(b)(1) of the Act, 21 U.S.C.section 360bbb-3(b)(1), unless the authorization is terminated  or revoked sooner.       Influenza A by PCR NEGATIVE NEGATIVE Final   Influenza B by PCR NEGATIVE NEGATIVE Final    Comment: (NOTE) The Xpert Xpress SARS-CoV-2/FLU/RSV plus assay is intended as an aid in the diagnosis of influenza from Nasopharyngeal swab specimens and should not be used as a sole basis for treatment. Nasal washings and aspirates are unacceptable for Xpert Xpress SARS-CoV-2/FLU/RSV testing.  Fact Sheet for Patients: EntrepreneurPulse.com.au  Fact Sheet for Healthcare Providers: IncredibleEmployment.be  This test is not yet approved or cleared by the [place] FDA and has been authorized for detection and/or diagnosis of SARS-CoV-2 by FDA under an Emergency Use Authorization (EUA). This EUA will remain in effect (meaning this test can be used) for the duration of the COVID-19 declaration under Section 564(b)(1) of the Act, 21 U.S.C. section 360bbb-3(b)(1), unless the authorization is terminated or revoked.  Performed at [facility], [address], [place], [place] [address]   Urine Culture     Status: None   Collection Time: 09/26/21  5:30 AM   Specimen: Urine, Clean Catch  Result Value Ref Range Status   Specimen Description   Final    URINE, CLEAN CATCH Performed at [facility], [address], [place], [place] [address]    Special Requests   Final    NONE Performed at [facility], [address], [place], [place] [address]     Culture   Final    NO GROWTH Performed at [facility], [place] [address], [place], [place] [address]    Report Status 09/28/2021 FINAL  Final     Signed: Marlowe Aschoff Laynie Espy  [facility] 09/29/2021, 1:35 PM

## 2021-09-29 NOTE — Progress Notes (Signed)
GI Inpatient Follow-up Note  Subjective:  Doing well this morning. No further significant bleeding after colonoscopy yesterday to treat post-polypectomy bleed.   Scheduled Inpatient Medications:   feeding supplement  237 mL Oral TID BM   folic acid  1 mg Oral Daily   multivitamin with minerals  1 tablet Oral Daily   pantoprazole (PROTONIX) IV  40 mg Intravenous Q12H   thiamine  100 mg Oral Daily   Or   thiamine  100 mg Intravenous Daily    Continuous Inpatient Infusions:    PRN Inpatient Medications:  baclofen  Review of Systems:  Review of Systems  Constitutional:  Negative for chills and fever.  Respiratory:  Negative for shortness of breath.   Cardiovascular:  Negative for chest pain.  Gastrointestinal:  Negative for abdominal pain, blood in stool, constipation, diarrhea, melena and nausea.  Musculoskeletal:  Negative for joint pain.  Skin:  Negative for itching and rash.  Psychiatric/Behavioral:  Negative for substance abuse.   All other systems reviewed and are negative.    Physical Examination: BP (!) 146/80 (BP Location: Right Arm)   Pulse 90   Temp 98 F (36.7 C) (Oral)   Resp 20   Ht 6' (1.829 m)   Wt 77.7 kg   SpO2 100%   BMI 23.25 kg/m  Gen: NAD, alert and oriented x 4 HEENT: PEERLA, EOMI, Neck: supple, no JVD or thyromegaly Chest: No respiratory distress Abd: soft, non-distended Ext: no edema, well perfused with 2+ pulses, Skin: no rash or lesions noted Lymph: no LAD  Data: Lab Results  Component Value Date   WBC 5.7 09/29/2021   HGB 10.7 (L) 09/29/2021   HCT 30.8 (L) 09/29/2021   MCV 105.1 (H) 09/29/2021   PLT 190 09/29/2021   Recent Labs  Lab 09/27/21 1015 09/28/21 0457 09/29/21 0325  HGB 13.8 14.2 10.7*   Lab Results  Component Value Date   NA 138 09/29/2021   K 3.9 09/29/2021   CL 105 09/29/2021   CO2 29 09/29/2021   BUN 8 09/29/2021   CREATININE 1.24 09/29/2021   Lab Results  Component Value Date   ALT 81 (H)  09/29/2021   AST 122 (H) 09/29/2021   ALKPHOS 64 09/29/2021   BILITOT 0.7 09/29/2021   Recent Labs  Lab 09/25/21 0737  INR 0.9   Assessment/Plan: Mr. Ballweg is a 45 y.o. gentleman who presented with mild alcoholic hepatitis and subsequently developed post-polypectomy bleed after inpatient colonoscopy. Doing well today. Drop in hemoglobin likely secondary from bleeding yesterday. No further significant hematochezia and patient feels well.  Recommendations:  - not opposed to discharge with close f/u which I will arrange - return precautions given  Please call with any questions or concerns.  Merlyn Lot MD, MPH [facility] GI

## 2021-09-29 NOTE — Discharge Instructions (Signed)
Suggestions For Increasing Calories And Protein . Several small meals a day are easier to eat and digest than three large ones. Space meals about 2 to 3 hours apart to maximize comfort. . Stop eating 2 to 3 hours before bed and sleep with your head elevated if gastric reflux (GERD) and heartburn are problems. . Do not eat your favorite foods if you are feeling bad. Save them for when you feel good! . Eat breakfast-type foods at any meal. Eggs are usually easy to eat and are great any time of the day. (The same goes for pancakes and waffles.) . Eat when you feel hungry. Most people have the greatest appetite in the morning because they have not eaten all night. If this is the best meal for you, then pile on those calories and other nutrients in the morning and at lunch. Then you can have a smaller dinner without losing total calories for the day. . Eat leftovers or nutritious snacks in the afternoon and early evening to round out your day. . Try homemade or commercially prepared nutrition bars and puddings, as well as calorie- and protein-rich liquid nutritional supplements. Benefits of Physical Activity Talk to your doctor about physical activity. Light or moderate physical activity can help maintain muscle and promote an appetite. Walking in the neighborhood or the local mall is a great way to get up, get out, and get moving. If you are unsteady on your feet, try walking around the dining room table. Save Room for Calorie-Rich Food! Drink most fluids between meals instead of with meals. (It is fine to have a sip to help swallow food at meal time.) Fluids (which usually have fewer calories and nutrients than solid food) can take up valuable space in your stomach.  Foods Recommended High-Protein Foods Milk products Add cheese to toast, crackers, sandwiches, baked potatoes, vegetables, soups, noodles, meat, and fruit. Use reduced-fat (2%) or whole milk in place of water when cooking cereal and cream  soups. Include cream sauces on vegetables and pasta. Add powdered milk to cream soups and mashed potatoes.  Eggs Have hard-cooked eggs readily available in the refrigerator. Chop and add to salads, casseroles, soups, and vegetables. Make a quick egg salad. All eggs should be well cooked to avoid the risk of harmful bacteria.  Meats, poultry, and fish Add leftover cooked meats to soups, casseroles, salads, and omelets. Make dip by mixing diced, chopped, or shredded meat with sour cream and spices.  Beans, legumes, nuts, and seeds Sprinkle nuts and seeds on cereals, fruit, and desserts such as ice cream, pudding, and custard. Also serve nuts and seeds on vegetables, salads, and pasta. Spread peanut butter on toast, bread, English muffins, and fruit, or blend it in a milk shake. Add beans and peas to salads, soups, casseroles, and vegetable dishes.  High-Calorie Foods Butter, margarine, and  oils Melt butter or margarine over potatoes, rice, pasta, and cooked vegetables. Add melted butter or margarine into soups and casseroles and spread on bread for sandwiches before spreading sandwich spread or peanut butter. Saut or stir-fry vegetables, meats, chicken and fish such as shrimp/scallops in olive or canola oil. A variety of oils add calories and can be used to marinate meat, chicken, or fish.  Milk products Add whipping cream to desserts, pancakes, waffles, fruit, and hot chocolate, and fold it into soups and casseroles. Add sour cream to baked potatoes and vegetables.  Salad dressing Use regular (not low-fat or diet) mayonnaise and salad dressing on sandwiches and   in dips with vegetables and fruit.   Sweets Add jelly and honey to bread and crackers. Add jam to fruit and ice cream and as a topping over cake.   Copyright 2020  Academy of [employer]etics. All rights reserved.  Nutrition Post Hospital Stay Proper nutrition can help your body recover from illness and injury.   Foods and beverages  high in protein, vitamins, and minerals help rebuild muscle loss, promote healing, & reduce fall risk.   .In addition to eating healthy foods, a nutrition shake is an easy, delicious way to get the nutrition you need during and after your hospital stay  It is recommended that you continue to drink 2 bottles per day of:       Ensure Plus for at least 1 month (30 days) after your hospital stay   Tips for adding a nutrition shake into your routine: As allowed, drink one with vitamins or medications instead of water or juice Enjoy one as a tasty mid-morning or afternoon snack Drink cold or make a milkshake out of it Drink one instead of milk with cereal or snacks Use as a coffee creamer   Available at the following grocery stores and pharmacies:           * [employer] * [employer] * [employer]  * [employer]          * Walmart * [employer]  * Wal[facility]reens      * Target  * [employer]J's   * [facility]  * [employer]   * [facility] [phone]            For COUPONS visit: www.[email or web address]/join or [email or web address]/members/sign-[email or web address]   Suggested Substitutions Ensure Plus = Boost Plus = Carnation Breakfast Essentials = Boost Compact Ensure Active Clear = Boost Breeze Glucerna Shake = Boost Glucose Control = Carnation Breakfast Essentials SUGAR FREE    

## 2021-09-29 NOTE — Progress Notes (Signed)
Nutrition Follow-up  DOCUMENTATION CODES:   Not applicable  INTERVENTION:   -Continue MVI with minerals daily -Ensure Enlive po TID, each supplement provides 350 kcal and 20 grams of protein  -Provided "Tips for Increasing Calories and Protein" handout from [facility]'s Nutrition Care Manual; attached to AVS/ discharge summary  NUTRITION DIAGNOSIS:   Inadequate oral intake related to altered GI function as evidenced by NPO status.  Progressing; advanced to regular diet on 09/28/21  GOAL:   Patient will meet greater than or equal to 90% of their needs  Progressing   MONITOR:   PO intake, Supplement acceptance, Diet advancement, Labs, Weight trends, Skin, I & O's  REASON FOR ASSESSMENT:   Malnutrition Screening Tool, Consult Assessment of nutrition requirement/status  ASSESSMENT:   Nicholas Fletcher is a 45 year old male with a history of alcohol abuse and presents to ED with persistent, chronic worsening emesis and loss of appetite.  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 Noted pt has consumed 100% of meals. RD will add supplements now that diet has been advanced.    Medications reviewed and include folic acid and thiamine.  Labs reviewed.    Diet Order:   Diet Order             Diet regular Room service appropriate? Yes; Fluid consistency: Thin  Diet effective now                   EDUCATION NEEDS:   Not appropriate for education at this time  Skin:  Skin Assessment: Reviewed RN Assessment  Last BM:  09/28/21  Height:   Ht Readings from Last 1 Encounters:  09/28/21 6' (1.829 m)    Weight:   Wt Readings from  Last 1 Encounters:  09/28/21 77.7 kg    Ideal Body Weight:  80.9 kg  BMI:  Body mass index is 23.25 kg/m.  Estimated Nutritional Needs:   Kcal:  7824-2353  Protein:  120-135 grams  Fluid:  > 2 L    Levada Schilling, RD, LDN, CDCES Registered Dietitian II Certified Diabetes Care and Education Specialist Please refer to [facility] for RD and/or RD on-call/weekend/after hours pager

## 2022-06-24 ENCOUNTER — Other Ambulatory Visit: Payer: Self-pay

## 2022-06-24 ENCOUNTER — Emergency Department: Payer: Self-pay

## 2022-06-24 ENCOUNTER — Inpatient Hospital Stay: Payer: Self-pay

## 2022-06-24 ENCOUNTER — Inpatient Hospital Stay
Admission: EM | Admit: 2022-06-24 | Discharge: 2022-06-27 | DRG: 640 | Disposition: A | Discharge status: Home / Self Care | Payer: Self-pay | Attending: Internal Medicine | Admitting: Internal Medicine

## 2022-06-24 DIAGNOSIS — Z6822 Body mass index (BMI) 22.0-22.9, adult: Secondary | ICD-10-CM

## 2022-06-24 DIAGNOSIS — G9341 Metabolic encephalopathy: Secondary | ICD-10-CM | POA: Diagnosis present

## 2022-06-24 DIAGNOSIS — K760 Fatty (change of) liver, not elsewhere classified: Secondary | ICD-10-CM | POA: Diagnosis present

## 2022-06-24 DIAGNOSIS — E43 Unspecified severe protein-calorie malnutrition: Secondary | ICD-10-CM | POA: Insufficient documentation

## 2022-06-24 DIAGNOSIS — Z20822 Contact with and (suspected) exposure to covid-19: Secondary | ICD-10-CM | POA: Diagnosis present

## 2022-06-24 DIAGNOSIS — E861 Hypovolemia: Secondary | ICD-10-CM | POA: Diagnosis present

## 2022-06-24 DIAGNOSIS — F10139 Alcohol abuse with withdrawal, unspecified: Secondary | ICD-10-CM | POA: Diagnosis present

## 2022-06-24 DIAGNOSIS — K209 Esophagitis, unspecified without bleeding: Secondary | ICD-10-CM | POA: Diagnosis present

## 2022-06-24 DIAGNOSIS — F419 Anxiety disorder, unspecified: Secondary | ICD-10-CM | POA: Diagnosis present

## 2022-06-24 DIAGNOSIS — E869 Volume depletion, unspecified: Secondary | ICD-10-CM | POA: Diagnosis present

## 2022-06-24 DIAGNOSIS — E876 Hypokalemia: Secondary | ICD-10-CM | POA: Diagnosis present

## 2022-06-24 DIAGNOSIS — I493 Ventricular premature depolarization: Secondary | ICD-10-CM | POA: Diagnosis present

## 2022-06-24 DIAGNOSIS — F17290 Nicotine dependence, other tobacco product, uncomplicated: Secondary | ICD-10-CM | POA: Diagnosis present

## 2022-06-24 DIAGNOSIS — F1090 Alcohol use, unspecified, uncomplicated: Secondary | ICD-10-CM | POA: Diagnosis present

## 2022-06-24 DIAGNOSIS — R569 Unspecified convulsions: Secondary | ICD-10-CM | POA: Diagnosis present

## 2022-06-24 DIAGNOSIS — Z79899 Other long term (current) drug therapy: Secondary | ICD-10-CM

## 2022-06-24 DIAGNOSIS — E871 Hypo-osmolality and hyponatremia: Principal | ICD-10-CM | POA: Diagnosis present

## 2022-06-24 DIAGNOSIS — F1010 Alcohol abuse, uncomplicated: Secondary | ICD-10-CM | POA: Diagnosis present

## 2022-06-24 DIAGNOSIS — Z7141 Alcohol abuse counseling and surveillance of alcoholic: Secondary | ICD-10-CM

## 2022-06-24 DIAGNOSIS — R4781 Slurred speech: Secondary | ICD-10-CM | POA: Diagnosis present

## 2022-06-24 LAB — SARS CORONAVIRUS 2 BY RT PCR: SARS Coronavirus 2 by RT PCR: NEGATIVE

## 2022-06-24 LAB — PROTIME-INR
INR: 0.9 (ref 0.8–1.2)
Prothrombin Time: 12.4 seconds (ref 11.4–15.2)

## 2022-06-24 LAB — CBC
HCT: 41.9 % (ref 39.0–52.0)
HCT: 38.8 % — ABNORMAL LOW (ref 39.0–52.0)
Hemoglobin: 15.6 g/dL (ref 13.0–17.0)
Hemoglobin: 14.8 g/dL (ref 13.0–17.0)
MCH: 35.1 pg — ABNORMAL HIGH (ref 26.0–34.0)
MCH: 35.6 pg — ABNORMAL HIGH (ref 26.0–34.0)
MCHC: 37.2 g/dL — ABNORMAL HIGH (ref 30.0–36.0)
MCHC: 38.1 g/dL — ABNORMAL HIGH (ref 30.0–36.0)
MCV: 94.4 fL (ref 80.0–100.0)
MCV: 93.3 fL (ref 80.0–100.0)
Platelets: 261 10*3/uL (ref 150–400)
Platelets: 214 10*3/uL (ref 150–400)
RBC: 4.44 MIL/uL (ref 4.22–5.81)
RBC: 4.16 MIL/uL — ABNORMAL LOW (ref 4.22–5.81)
RDW: 10.8 % — ABNORMAL LOW (ref 11.5–15.5)
RDW: 10.9 % — ABNORMAL LOW (ref 11.5–15.5)
WBC: 10.4 10*3/uL (ref 4.0–10.5)
WBC: 6.7 10*3/uL (ref 4.0–10.5)
nRBC: 0 % (ref 0.0–0.2)
nRBC: 0 % (ref 0.0–0.2)

## 2022-06-24 LAB — URINALYSIS, COMPLETE (UACMP) WITH MICROSCOPIC
Bacteria, UA: NONE SEEN
Bilirubin Urine: NEGATIVE
Glucose, UA: NEGATIVE mg/dL
Ketones, ur: NEGATIVE mg/dL
Leukocytes,Ua: NEGATIVE
Nitrite: NEGATIVE
Protein, ur: NEGATIVE mg/dL
Specific Gravity, Urine: 1.003 — ABNORMAL LOW (ref 1.005–1.030)
Squamous Epithelial / HPF: NONE SEEN (ref 0–5)
pH: 6 (ref 5.0–8.0)

## 2022-06-24 LAB — COMPREHENSIVE METABOLIC PANEL
ALT: 44 U/L (ref 0–44)
AST: 106 U/L — ABNORMAL HIGH (ref 15–41)
Albumin: 4.4 g/dL (ref 3.5–5.0)
Alkaline Phosphatase: 80 U/L (ref 38–126)
Anion gap: 18 — ABNORMAL HIGH (ref 5–15)
BUN: <5 mg/dL — ABNORMAL LOW (ref 6–20)
CO2: 20 mmol/L — ABNORMAL LOW (ref 22–32)
Calcium: 9.3 mg/dL (ref 8.9–10.3)
Chloride: 77 mmol/L — ABNORMAL LOW (ref 98–111)
Creatinine, Ser: 0.97 mg/dL (ref 0.61–1.24)
GFR, Estimated: >60 mL/min (ref >60)
Glucose, Bld: 151 mg/dL — ABNORMAL HIGH (ref 70–99)
Potassium: 2.2 mmol/L — CL (ref 3.5–5.1)
Sodium: 115 mmol/L — CL (ref 135–145)
Total Bilirubin: 1.4 mg/dL — ABNORMAL HIGH (ref 0.3–1.2)
Total Protein: 7.7 g/dL (ref 6.5–8.1)

## 2022-06-24 LAB — MAGNESIUM
Magnesium: 1.3 mg/dL — ABNORMAL LOW (ref 1.7–2.4)
Magnesium: 2 mg/dL (ref 1.7–2.4)

## 2022-06-24 LAB — OSMOLALITY: Osmolality: 232 mOsm/kg — CL (ref 275–295)

## 2022-06-24 LAB — SODIUM
Sodium: 110 mmol/L — CL (ref 135–145)
Sodium: 112 mmol/L — CL (ref 135–145)
Sodium: 124 mmol/L — ABNORMAL LOW (ref 135–145)
Sodium: 126 mmol/L — ABNORMAL LOW (ref 135–145)

## 2022-06-24 LAB — URINE DRUG SCREEN, QUALITATIVE (ARMC ONLY)
Amphetamines, Ur Screen: NOT DETECTED
Barbiturates, Ur Screen: NOT DETECTED
Benzodiazepine, Ur Scrn: NOT DETECTED
Cannabinoid 50 Ng, Ur ~~LOC~~: NOT DETECTED
Cocaine Metabolite,Ur ~~LOC~~: NOT DETECTED
MDMA (Ecstasy)Ur Screen: NOT DETECTED
Methadone Scn, Ur: NOT DETECTED
Opiate, Ur Screen: NOT DETECTED
Phencyclidine (PCP) Ur S: NOT DETECTED
Tricyclic, Ur Screen: NOT DETECTED

## 2022-06-24 LAB — PHOSPHORUS: Phosphorus: 1.7 mg/dL — ABNORMAL LOW (ref 2.5–4.6)

## 2022-06-24 LAB — TROPONIN I (HIGH SENSITIVITY)
Troponin I (High Sensitivity): 4 ng/L (ref <18)
Troponin I (High Sensitivity): 6 ng/L (ref <18)

## 2022-06-24 LAB — GLUCOSE, CAPILLARY
Glucose-Capillary: 104 mg/dL — ABNORMAL HIGH (ref 70–99)
Glucose-Capillary: 118 mg/dL — ABNORMAL HIGH (ref 70–99)
Glucose-Capillary: 123 mg/dL — ABNORMAL HIGH (ref 70–99)

## 2022-06-24 LAB — OSMOLALITY, URINE: Osmolality, Ur: 98 mOsm/kg — ABNORMAL LOW (ref 300–900)

## 2022-06-24 LAB — ETHANOL: Alcohol, Ethyl (B): <10 mg/dL (ref <10)

## 2022-06-24 LAB — POTASSIUM
Potassium: 2.7 mmol/L — CL (ref 3.5–5.1)
Potassium: 2.4 mmol/L — CL (ref 3.5–5.1)

## 2022-06-24 LAB — MRSA NEXT GEN BY PCR, NASAL: MRSA by PCR Next Gen: NOT DETECTED

## 2022-06-24 LAB — AMMONIA: Ammonia: 78 umol/L — ABNORMAL HIGH (ref 9–35)

## 2022-06-24 LAB — SODIUM, URINE, RANDOM: Sodium, Ur: <10 mmol/L

## 2022-06-24 LAB — APTT: aPTT: 26 seconds (ref 24–36)

## 2022-06-24 LAB — CBG MONITORING, ED: Glucose-Capillary: 149 mg/dL — ABNORMAL HIGH (ref 70–99)

## 2022-06-24 MED ORDER — SODIUM CHLORIDE 3 % IV SOLN
INTRAVENOUS | Status: DC
  Filled 2022-06-24 (×2): qty 500

## 2022-06-24 MED ORDER — ADULT MULTIVITAMIN W/MINERALS CH
1.0000 | ORAL_TABLET | Freq: Every day | ORAL | Status: DC
  Administered 2022-06-24 – 2022-06-27 (×4): 1 via ORAL
  Filled 2022-06-24 (×4): qty 1

## 2022-06-24 MED ORDER — SODIUM CHLORIDE 0.9 % IV BOLUS
1000.0000 mL | Freq: Once | INTRAVENOUS | Status: AC
  Administered 2022-06-24: 1000 mL via INTRAVENOUS

## 2022-06-24 MED ORDER — POTASSIUM CHLORIDE CRYS ER 20 MEQ PO TBCR
40.0000 meq | EXTENDED_RELEASE_TABLET | Freq: Once | ORAL | Status: AC
Start: 2022-06-24 — End: 2022-06-24
  Administered 2022-06-24: 40 meq via ORAL
  Filled 2022-06-24: qty 2

## 2022-06-24 MED ORDER — LORAZEPAM 2 MG/ML IJ SOLN
INTRAMUSCULAR | Status: AC
  Administered 2022-06-24: 2 mg via INTRAVENOUS
  Filled 2022-06-24: qty 1

## 2022-06-24 MED ORDER — DEXTROSE 5 % IV SOLN: INTRAVENOUS | Status: DC

## 2022-06-24 MED ORDER — POTASSIUM PHOSPHATES 15 MMOLE/5ML IV SOLN
15.0000 mmol | Freq: Once | INTRAVENOUS | Status: AC
  Administered 2022-06-25: 15 mmol via INTRAVENOUS

## 2022-06-24 MED ORDER — LORAZEPAM 2 MG/ML IJ SOLN
1.0000 mg | INTRAMUSCULAR | Status: DC | PRN
  Administered 2022-06-26: 2 mg via INTRAVENOUS
  Filled 2022-06-24: qty 1

## 2022-06-24 MED ORDER — ENOXAPARIN SODIUM 40 MG/0.4ML IJ SOSY
40.0000 mg | PREFILLED_SYRINGE | INTRAMUSCULAR | Status: DC
  Administered 2022-06-24 – 2022-06-26 (×3): 40 mg via SUBCUTANEOUS
  Filled 2022-06-24 (×3): qty 0.4

## 2022-06-24 MED ORDER — FOLIC ACID 1 MG PO TABS
1.0000 mg | ORAL_TABLET | Freq: Every day | ORAL | Status: DC
  Administered 2022-06-24 – 2022-06-27 (×4): 1 mg via ORAL
  Filled 2022-06-24 (×4): qty 1

## 2022-06-24 MED ORDER — POTASSIUM PHOSPHATES 15 MMOLE/5ML IV SOLN
15.0000 mmol | Freq: Once | INTRAVENOUS | Status: AC
  Administered 2022-06-24: 15 mmol via INTRAVENOUS
  Filled 2022-06-24: qty 5

## 2022-06-24 MED ORDER — LORAZEPAM 2 MG/ML IJ SOLN: 2.0000 mg | Freq: Once | INTRAMUSCULAR | Status: AC

## 2022-06-24 MED ORDER — THIAMINE HCL 100 MG/ML IJ SOLN
500.0000 mg | Freq: Every day | INTRAMUSCULAR | Status: AC
  Administered 2022-06-24 – 2022-06-27 (×4): 500 mg via INTRAVENOUS
  Filled 2022-06-24 (×4): qty 5

## 2022-06-24 MED ORDER — POLYETHYLENE GLYCOL 3350 17 G PO PACK: 17.0000 g | PACK | Freq: Every day | ORAL | Status: DC | PRN

## 2022-06-24 MED ORDER — LORAZEPAM 1 MG PO TABS
1.0000 mg | ORAL_TABLET | ORAL | Status: DC | PRN
  Administered 2022-06-25: 1 mg via ORAL
  Filled 2022-06-24: qty 1

## 2022-06-24 MED ORDER — PANTOPRAZOLE SODIUM 40 MG IV SOLR
40.0000 mg | Freq: Two times a day (BID) | INTRAVENOUS | Status: DC
  Administered 2022-06-24 – 2022-06-27 (×6): 40 mg via INTRAVENOUS
  Filled 2022-06-24 (×6): qty 10

## 2022-06-24 MED ORDER — POTASSIUM CHLORIDE CRYS ER 20 MEQ PO TBCR
40.0000 meq | EXTENDED_RELEASE_TABLET | Freq: Once | ORAL | Status: AC
  Administered 2022-06-24: 40 meq via ORAL
  Filled 2022-06-24: qty 2

## 2022-06-24 MED ORDER — MAGNESIUM SULFATE 2 GM/50ML IV SOLN
2.0000 g | Freq: Once | INTRAVENOUS | Status: AC
  Administered 2022-06-24: 2 g via INTRAVENOUS

## 2022-06-24 MED ORDER — MAGNESIUM SULFATE 2 GM/50ML IV SOLN
2.0000 g | Freq: Once | INTRAVENOUS | Status: DC
  Filled 2022-06-24: qty 50

## 2022-06-24 MED ORDER — DEXTROSE-NACL 5-0.9 % IV SOLN: INTRAVENOUS | Status: DC

## 2022-06-24 MED ORDER — POTASSIUM CHLORIDE CRYS ER 20 MEQ PO TBCR
30.0000 meq | EXTENDED_RELEASE_TABLET | Freq: Once | ORAL | Status: AC
  Administered 2022-06-24: 30 meq via ORAL
  Filled 2022-06-24: qty 2

## 2022-06-24 MED ORDER — PANTOPRAZOLE SODIUM 40 MG IV SOLR: 40.0000 mg | INTRAVENOUS | Status: DC

## 2022-06-24 MED ORDER — CHLORHEXIDINE GLUCONATE CLOTH 2 % EX PADS
6.0000 | MEDICATED_PAD | Freq: Every day | CUTANEOUS | Status: DC
  Administered 2022-06-25 – 2022-06-27 (×3): 6 via TOPICAL

## 2022-06-24 MED ORDER — POTASSIUM PHOSPHATES 15 MMOLE/5ML IV SOLN: 30.0000 mmol | Freq: Once | INTRAVENOUS | Status: DC

## 2022-06-24 MED ORDER — POTASSIUM CHLORIDE 10 MEQ/100ML IV SOLN
10.0000 meq | Freq: Once | INTRAVENOUS | Status: AC
  Administered 2022-06-24: 10 meq via INTRAVENOUS
  Filled 2022-06-24: qty 100

## 2022-06-24 MED ORDER — DOCUSATE SODIUM 100 MG PO CAPS: 100.0000 mg | ORAL_CAPSULE | Freq: Two times a day (BID) | ORAL | Status: DC | PRN

## 2022-06-24 MED ORDER — ONDANSETRON HCL 4 MG/2ML IJ SOLN: 4.0000 mg | Freq: Four times a day (QID) | INTRAMUSCULAR | Status: DC | PRN

## 2022-06-24 MED ORDER — MAGNESIUM SULFATE 4 GM/100ML IV SOLN
4.0000 g | Freq: Once | INTRAVENOUS | Status: AC
  Administered 2022-06-24: 4 g via INTRAVENOUS
  Filled 2022-06-24: qty 100

## 2022-06-24 MED ORDER — DESMOPRESSIN ACETATE 4 MCG/ML IJ SOLN
2.0000 ug | Freq: Once | INTRAMUSCULAR | Status: AC
  Administered 2022-06-24: 2 ug via INTRAVENOUS
  Filled 2022-06-24: qty 1

## 2022-06-24 MED ORDER — THIAMINE HCL 100 MG/ML IJ SOLN: 100.0000 mg | INTRAMUSCULAR | Status: DC

## 2022-06-24 MED ORDER — POTASSIUM PHOSPHATES 15 MMOLE/5ML IV SOLN
15.0000 mmol | Freq: Once | INTRAVENOUS | Status: DC
  Filled 2022-06-24: qty 5

## 2022-06-24 NOTE — Progress Notes (Signed)
Pt arrived n the unit. Alert and oriented to self and situation.  Per ED RN Sodium was taken right before transferring to ICU. Still pending. MD is aware of a large jump and will address it after the last Na levels. Just now, lab called NA is 112, MD notified and aware. Critical Mg was reported to MD as well and will repeat it per MD (at the bedside).   Family at the bedside with belongings. Bed in low position, alarms are on, call bell in reach, Seizure precautions are in place. Report was given to upcoming nurse. Continue to monitor.

## 2022-06-24 NOTE — ED Triage Notes (Signed)
Pt states that around 0400 he started feeling dizzy and his hand started cramping- pt with his wife who states he started having slurred speech about 1300- pt complaining of chest tightness

## 2022-06-24 NOTE — H&P (Signed)
NAME:  Nicholas Fletcher, MRN:  [id number], DOB:  1976-08-04, LOS: 0 ADMISSION DATE:  06/24/2022, CONSULTATION DATE: 06/24/22 REFERRING MD: Dr. Lenard Lance , CHIEF COMPLAINT: Dizziness   History of Present Illness:  This is a 46 yo male with a hx of ETOH Abuse and Alcoholic Hepatitis who presented to [facility] ER on 09/3 with dizziness, slurred speech, chest tightness, and cramping of the hands.  Pts wife reported the slurred speech started today at 1300, and pt has not been "acting like himself."  Pt reports de drinks 750 ml of vodka over a 2 day period and drinks daily.  His last alcoholic beverage was last Monday or Tuesday.  He also endorses he started vaping 3-4 months ago.  His wife reports he also vomited the night of 09/2.    ED Course  While being triaged in the ER pt noted to have a witnessed tonic- clonic seizure.  Per ER documentation following seizure activity pt somewhat confused and somnolent. Pt received 2 mg of iv ativan.  He denies any previous hx of seizures.  CT Head negative for acute intracranial abnormality.  Lab results revealed  Na+ 110, K+ 2.2, chloride 77, CO2 20, glucose 151, BUN <5, anion gap 18, magnesium 1.3, and osmolality 232.  Urine drug screen negative, alcohol level <10, urine osmolality 98, urine sodium <10, urine specific gravity 1.003, and UA negative for UTI.  Pt received 1L NS bolus, however he remained hyponatremic with repeat Na+ 115 requiring 3% saline gtt.  PCCM team contacted by by ER provider for ICUD admission .   Initial EKG: Sinus rhythm, heart rate 83, non specific T wave abnormality, prolonged QTc 605 Repeat EKG: Sinus rhythm hr 83 with intermittent PVC's, prolonged QTc 515 CXR: negative for cardiopulmonary disease   Pertinent  Medical History  ETOH Abuse  Vape Use Alcoholic Hepatitis  Severe Esophagitis  GI Bleed  Diverticulitis  Hepatic Steatosis    Significant Hospital Events: Including procedures, antibiotic start and stop dates in addition to  other pertinent events   09/3: Pt admitted with tonic-clonic seizure activity, severe hyponatremia, acute metabolic encephalopathy, severe electrolyte derangements, and ETOH withdrawal requiring hypertonic saline  Interim History / Subjective:  Pt drowsy and confused to situation at times but following commands and does not appear in acute distress.  No seizure activity present at this time   Objective   Blood pressure (!) 125/95, pulse 86, temperature 98.1 F (36.7 C), temperature source Oral, resp. rate 18, height 6' (1.829 m), weight 79.4 kg, SpO2 100 %.        Intake/Output Summary (Last 24 hours) at 06/24/2022 1736 Last data filed at 06/24/2022 1723 Gross per 24 hour  Intake --  Output 800 ml  Net -800 ml   Filed Weights   06/24/22 1449  Weight: 79.4 kg    Examination: General: Acutely-ill appearing male, resting in bed NAD HENT: Supple, no JVD  Lungs: Clear throughout, even, non labored  Cardiovascular: Sinus rhythm, s1s2, no r/g, 2+ radial/2+ distal pulses, no edema Abdomen: +BS x4, soft, non tender, non distended  Extremities: Normal bulk and tone, moves all extremities  Neuro: Lethargic, disoriented to situation intermittently, following commands, PERRLA  GU: Deferred   Resolved Hospital Problem list     Assessment & Plan:  Acute hypovolemic hyponatremia secondary to ETOH abuse  Seizure activity secondary to acute hyponatremia or ETOH withdrawal  - Continue 3% saline at a decreased rate @30  ml/hr for now - Serial Na+ levels q2hrs x2  followed by [facility].Matters  - Nephrology consulted appreciate input  - Pharmacy consulted appreciate input  - Seizure precautions  - Prn ativan for seizure activity  - EEG pending  - Neuro checks q4hrs   Hypomagnesia  Hypokalemia  Hypophosphatemia  - Trend BMP, magnesium, and phosphorous  - Replace electrolytes as indicated  - Monitor UOP - Avoid nephrotoxic medications  Prolonged QTc - Continuous telemetry monitoring - Avoid QTc  prolonging medications  - Repeat EKG following electrolyte replacement  - Troponin pending  - Echo pending   Elevated AST secondary to chronic alcohol use  Hyperammoniemia Hx: Severe esophagitis  - Trend coags and hepatic function panel  - If ammonia trending up will start lactulose  - PPI q12hrs   Acute metabolic encephalopathy  ETOH withdrawal  - High dose thiamine for 4 days and will start D5NS infusion @50  ml/hr  - MVI and folic acid - Prn ativan per CIWA protocol  - Supportive care  - ETOH and vape use cessation counseling provided  - Transition of care team consulted for substance abuse counseling/education    Best Practice (right click and "Reselect all SmartList Selections" daily)   Diet/type: NPO DVT prophylaxis: LMWH GI prophylaxis: PPI Lines: N/A Foley:  N/A Code Status:  full code Last date of multidisciplinary goals of care discussion [N/A]  Updated pt and pts wife regarding pts condition and current plan of care all questions were answered Labs   CBC: Recent Labs  Lab 06/24/22 1507  WBC 10.4  HGB 15.6  HCT 41.9  MCV 94.4  PLT 261    Basic Metabolic Panel: Recent Labs  Lab 06/24/22 1445 06/24/22 1507  NA 110* 115*  K  --  2.2*  CL  --  77*  CO2  --  20*  GLUCOSE  --  151*  BUN  --  <5*  CREATININE  --  0.97  CALCIUM  --  9.3  MG 1.3*  --    GFR: Estimated Creatinine Clearance: 102.2 mL/min (by C-G formula based on SCr of 0.97 mg/dL). Recent Labs  Lab 06/24/22 1507  WBC 10.4    Liver Function Tests: Recent Labs  Lab 06/24/22 1507  AST 106*  ALT 44  ALKPHOS 80  BILITOT 1.4*  PROT 7.7  ALBUMIN 4.4   No results for input(s): "LIPASE", "AMYLASE" in the last 168 hours. Recent Labs  Lab 06/24/22 1507  AMMONIA 78*    ABG No results found for: "PHART", "PCO2ART", "PO2ART", "HCO3", "TCO2", "ACIDBASEDEF", "O2SAT"   Coagulation Profile: No results for input(s): "INR", "PROTIME" in the last 168 hours.  Cardiac Enzymes: No  results for input(s): "CKTOTAL", "CKMB", "CKMBINDEX", "TROPONINI" in the last 168 hours.  HbA1C: No results found for: "HGBA1C"  CBG: Recent Labs  Lab 06/24/22 1445  GLUCAP 149*    Review of Systems: Positives in BOLD   Gen: cramping of hands, fever, chills, weight change, fatigue, night sweats HEENT: Denies blurred vision, double vision, hearing loss, tinnitus, sinus congestion, rhinorrhea, sore throat, neck stiffness, dysphagia PULM: Denies shortness of breath, cough, sputum production, hemoptysis, wheezing CV: chest tightness, edema, orthopnea, paroxysmal nocturnal dyspnea, palpitations GI: abdominal pain, nausea, vomiting, diarrhea, hematochezia, melena, constipation, change in bowel habits GU: Denies dysuria, hematuria, polyuria, oliguria, urethral discharge Endocrine: Denies hot or cold intolerance, polyuria, polyphagia or appetite change Derm: Denies rash, dry skin, scaling or peeling skin change Heme: Denies easy bruising, bleeding, bleeding gums Neuro: headache, numbness, weakness, slurred speech, confusion, seizure activity, loss of memory or consciousness  Past Medical History:  He,  has no past medical history on file.   Surgical History:   Past Surgical History:  Procedure Laterality Date   COLONOSCOPY WITH PROPOFOL N/A 09/26/2021   Procedure: COLONOSCOPY WITH PROPOFOL;  Surgeon: Regis Bill, MD;  Location: [facility] ENDOSCOPY;  Service: Endoscopy;  Laterality: N/A;   COLONOSCOPY WITH PROPOFOL N/A 09/28/2021   Procedure: COLONOSCOPY WITH PROPOFOL;  Surgeon: Regis Bill, MD;  Location: [facility] ENDOSCOPY;  Service: Endoscopy;  Laterality: N/A;   ESOPHAGOGASTRODUODENOSCOPY (EGD) WITH PROPOFOL N/A 09/26/2021   Procedure: ESOPHAGOGASTRODUODENOSCOPY (EGD) WITH PROPOFOL;  Surgeon: Regis Bill, MD;  Location: [facility] ENDOSCOPY;  Service: Endoscopy;  Laterality: N/A;   TONSILLECTOMY       Social History:   reports that he has been smoking cigars. He has never  used smokeless tobacco. He reports current alcohol use. He reports that he does not currently use drugs.   Family History:  His Family history is unknown by patient.   Allergies No Known Allergies   Home Medications  Prior to Admission medications   Medication Sig Start Date End Date Taking? Authorizing Provider  ibuprofen (ADVIL) 600 MG tablet Take 600 mg by mouth daily.    [provider]  oxyCODONE-acetaminophen (PERCOCET/ROXICET) 5-325 MG tablet Take 1 tablet by mouth every 4 (four) hours as needed for severe pain. Patient not taking: Reported on 09/24/2021 07/17/19   Tanda Rockers, PA-C  pantoprazole (PROTONIX) 40 MG tablet Take 1 tablet (40 mg total) by mouth 2 (two) times daily before a meal. 09/29/21 12/28/21  Dahal, Melina Schools, MD  sucralfate (CARAFATE) 1 GM/10ML suspension Take 10 mLs (1 g total) by mouth 4 (four) times daily. 09/29/21 12/28/21  Lorin Glass, MD     Critical care time: 60 minutes   Zada Girt, AGNP  Pulmonary/Critical Care Pager [phone] (please enter 7 digits) PCCM Consult Pager [phone] (please enter 7 digits)

## 2022-06-24 NOTE — ED Notes (Signed)
[place] RN aware of assigned bed

## 2022-06-24 NOTE — ED Provider Notes (Signed)
[facility] Provider Note    Event Date/Time   First MD Initiated Contact with Patient 06/24/22 1506     (approximate)  History   Chief Complaint: Dizziness  HPI  Nicholas Fletcher is a 46 y.o. male with no significant past medical history presents to the emergency department for confusion and not feeling well.  According to the wife around 4:00 this morning patient told her that he was feeling dizzy and lightheaded having hand cramping and around 1 PM today developed chest tightness some shortness of breath and somewhat slurred speech per wife.  Wife notes that the patient did not seem to be acting his normal self.  While the patient was being triaged in the emergency department he had a tonic-clonic seizure witnessed by nurse and by an ER physician.  Patient was brought back to room 7 urgently and I saw the patient in the room.  Patient is somewhat confused and somnolent consistent with a postictal state.  Wife denies any history of seizures previously but does state the patient drinks alcohol pretty much every day but has not drank in anything today and possibly yesterday.  Throughout my evaluation patient became more lucid and is now able to answer questions appropriately.  Physical Exam   Triage Vital Signs: ED Triage Vitals  Enc Vitals Group     BP 06/24/22 1448 (!) 125/95     Pulse Rate 06/24/22 1448 86     Resp 06/24/22 1448 18     Temp 06/24/22 1448 98.1 F (36.7 C)     Temp Source 06/24/22 1448 Oral     SpO2 06/24/22 1448 100 %     Weight 06/24/22 1449 175 lb (79.4 kg)     Height 06/24/22 1449 6' (1.829 m)     Head Circumference --      Peak Flow --      Pain Score 06/24/22 1449 4     Pain Loc --      Pain Edu? --      Excl. in [place]? --     Most recent vital signs: Vitals:   06/24/22 1448  BP: (!) 125/95  Pulse: 86  Resp: 18  Temp: 98.1 F (36.7 C)  SpO2: 100%    General: Awake, no distress.  Seems somewhat confused and still somewhat  somnolent but appears to be improving even over the 10 to 15 minutes I was in the room with the patient. CV:  Good peripheral perfusion.  Regular rate and rhythm  Resp:  Normal effort.  Equal breath sounds bilaterally.  Abd:  No distention.  Soft, nontender.  No rebound or guarding. Other:   Moves all extremities well, no obvious deficits.  ED Results / Procedures / Treatments   EKG  EKG viewed and interpreted by myself shows a sinus rhythm 83 bpm with a narrow QRS, normal axis, slight QTc prolongation otherwise normal intervals with no concerning ST changes.  RADIOLOGY  I have reviewed and interpreted the CT images the head, no significant abnormality seen on my evaluation. Radiology is read the CT scan as negative   MEDICATIONS ORDERED IN ED: Medications  sodium chloride (hypertonic) 3 % solution (has no administration in time range)  potassium chloride 10 mEq in 100 mL IVPB (has no administration in time range)  potassium chloride SA (KLOR-CON M) CR tablet 40 mEq (has no administration in time range)  LORazepam (ATIVAN) injection 2 mg (2 mg Intravenous Given 06/24/22 1517)  sodium chloride  0.9 % bolus 1,000 mL (1,000 mLs Intravenous New Bag/Given 06/24/22 1516)     IMPRESSION / MDM / ASSESSMENT AND PLAN / ED COURSE  I reviewed the triage vital signs and the nursing notes.  Patient's presentation is most consistent with acute presentation with potential threat to life or bodily function.  Patient presents to the emergency department for confusion weakness dizziness and then a witnessed tonic-clonic seizure.  No history of seizures previously.  We will obtain CT imaging of the head to rule out bleed mass/tumor.  Patient does admit to near daily alcohol use but did not drink today and possibly not yesterday.  We will check labs and continue to closely monitor.  We will IV hydrate.  I have dosed 2 mg of IV Ativan to hopefully prevent any further seizure activity and to help with any  possible withdrawal symptoms.  Vital signs are reassuringly normal besides mild hypertension.  CT scan head is negative for acute abnormality.  Lab work shows a normal CBC.  However patient has significant hyponatremia on his chemistry with a sodium of 115 hypokalemic at 2.2.  Normal renal function anion gap slightly elevated at 18.  Patient has received a liter of normal saline.  Given the patient's seizure and significant hyponatremia I have ordered 3% hypertonic saline.  We will replete the patient's potassium with IV and oral potassium.  I have added on a magnesium level.  Patient's ammonia level is also elevated at 78 AST of 106 ALT is 44 and alk phos of 80 with a total bili of 1.4.  Patient will require admission to the hospital for continued hypertonic saline as well as electrolyte replacement.  Patient is agreeable to plan.  Patient is more awake and appears to continue to improve.  Magnesium has resulted at 1.3 we will replete with 2 g of magnesium as well.  Repeat sodium is down to 110.  Patient now receiving 3% hypertonic saline.  I spoke to the intensivist Dr. Patsey Berthold will be down to evaluate the patient for admission to the ICU.  CRITICAL CARE Performed by: Harvest Dark   Total critical care time: 30 minutes  Critical care time was exclusive of separately billable procedures and treating other patients.  Critical care was necessary to treat or prevent imminent or life-threatening deterioration.  Critical care was time spent personally by me on the following activities: development of treatment plan with patient and/or surrogate as well as nursing, discussions with consultants, evaluation of patient's response to treatment, examination of patient, obtaining history from patient or surrogate, ordering and performing treatments and interventions, ordering and review of laboratory studies, ordering and review of radiographic studies, pulse oximetry and re-evaluation of patient's  condition.   FINAL CLINICAL IMPRESSION(S) / ED DIAGNOSES   Seizure Hyponatremia Hypokalemia Hypomagnesemia  Note:  This document was prepared using Dragon voice recognition software and may include unintentional dictation errors.   Harvest Dark, MD 06/24/22 [phone]

## 2022-06-24 NOTE — Progress Notes (Signed)
MEDICATION RELATED CONSULT NOTE - INITIAL   Pharmacy Consult for 3% NaCl Indication: Severe hyponatremia  No Known Allergies  Patient Measurements: Height: 6' (182.9 cm) Weight: 80.4 kg (177 lb 4 oz) IBW/kg (Calculated) : 77.6   Vital Signs: Temp: 98.3 F (36.8 C) (09/03 1759) Temp Source: Oral (09/03 1759) BP: 120/72 (09/03 1915) Pulse Rate: 79 (09/03 1915) Intake/Output from previous day: No intake/output data recorded. Intake/Output from this shift: No intake/output data recorded.  Labs: Recent Labs    06/24/22 1445 06/24/22 1507 06/24/22 1741  WBC  --  10.4 6.7  HGB  --  15.6 14.8  HCT  --  41.9 38.8*  PLT  --  261 214  CREATININE  --  0.97 0.51*  MG 1.3*  --   --   PHOS  --   --  1.7*  ALBUMIN  --  4.4  --   PROT  --  7.7  --   AST  --  106*  --   ALT  --  44  --   ALKPHOS  --  80  --   BILITOT  --  1.4*  --    Estimated Creatinine Clearance: 123.9 mL/min (A) (by C-G formula based on SCr of 0.51 mg/dL (L)).   Microbiology: Recent Results (from the past 720 hour(s))  SARS Coronavirus 2 by RT PCR (hospital order, performed in [facility] hospital lab) *cepheid single result test*     Status: None   Collection Time: 06/24/22  4:32 PM   Specimen: Nasal Swab  Result Value Ref Range Status   SARS Coronavirus 2 by RT PCR NEGATIVE NEGATIVE Final    Comment: (NOTE) SARS-CoV-2 target nucleic acids are NOT DETECTED.  The SARS-CoV-2 RNA is generally detectable in upper and lower respiratory specimens during the acute phase of infection. The lowest concentration of SARS-CoV-2 viral copies this assay can detect is 250 copies / mL. A negative result does not preclude SARS-CoV-2 infection and should not be used as the sole basis for treatment or other patient management decisions.  A negative result may occur with improper specimen collection / handling, submission of specimen other than nasopharyngeal swab, presence of viral mutation(s) within the areas targeted  by this assay, and inadequate number of viral copies (<250 copies / mL). A negative result must be combined with clinical observations, patient history, and epidemiological information.  Fact Sheet for Patients:   RoadLapTop.co.za  Fact Sheet for Healthcare Providers: http://kim-miller.com/  This test is not yet approved or  cleared by the [place] FDA and has been authorized for detection and/or diagnosis of SARS-CoV-2 by FDA under an Emergency Use Authorization (EUA).  This EUA will remain in effect (meaning this test can be used) for the duration of the COVID-19 declaration under Section 564(b)(1) of the Act, 21 U.S.C. section 360bbb-3(b)(1), unless the authorization is terminated or revoked sooner.  Performed at [facility], [address] [place], [place] [address]   MRSA Next Gen by PCR, Nasal     Status: None   Collection Time: 06/24/22  6:07 PM   Specimen: Nasal Mucosa; Nasal Swab  Result Value Ref Range Status   MRSA by PCR Next Gen NOT DETECTED NOT DETECTED Final    Comment: (NOTE) The GeneXpert MRSA Assay (FDA approved for NASAL specimens only), is one component of a comprehensive MRSA colonization surveillance program. It is not intended to diagnose MRSA infection nor to guide or monitor treatment for MRSA infections. Test performance is not  FDA approved in patients less than 64 years old. Performed at [facility], [address] [place], [place] [address]     Medical History: History reviewed. No pertinent past medical history.  Assessment: Assessment: Pharmacy consulted to monitor Na in this 46 year old male admitted with hyponatremia.    On Admission - Na 115  9/3 17:41 Na 112   Goal of Therapy:  No increase in Na > 4 mEq in 2 hrs No increase in Na > 6 mEq in 4 hrs Goal increase in Na ~ 8 mEq in 24 hrs   Plan:  NaCL 3% initiated at 63ml/h. Na checks q2h x 2 then q4h  Clovia Cuff, PharmD, BCPS 06/24/2022 8:03 PM

## 2022-06-24 NOTE — Progress Notes (Addendum)
MEDICATION RELATED CONSULT NOTE - INITIAL   Pharmacy Consult for 3% NaCl Indication: Severe hyponatremia  No Known Allergies  Patient Measurements: Height: 6' (182.9 cm) Weight: 80.4 kg (177 lb 4 oz) IBW/kg (Calculated) : 77.6   Vital Signs: Temp: 98.3 F (36.8 C) (09/03 1759) Temp Source: Oral (09/03 1759) BP: 120/72 (09/03 1915) Pulse Rate: 79 (09/03 1915) Intake/Output from previous day: No intake/output data recorded. Intake/Output from this shift: No intake/output data recorded.  Labs: Recent Labs    06/24/22 1445 06/24/22 1507 06/24/22 1741 06/24/22 1827 06/24/22 2112  WBC  --  10.4 6.7  --   --   HGB  --  15.6 14.8  --   --   HCT  --  41.9 38.8*  --   --   PLT  --  261 214  --   --   APTT  --   --   --   --  26  CREATININE  --  0.97  --   --   --   MG 1.3*  --   --  2.0  --   PHOS  --   --  1.7*  --   --   ALBUMIN  --  4.4  --   --   --   PROT  --  7.7  --   --   --   AST  --  106*  --   --   --   ALT  --  44  --   --   --   ALKPHOS  --  80  --   --   --   BILITOT  --  1.4*  --   --   --     Estimated Creatinine Clearance: 102.2 mL/min (by C-G formula based on SCr of 0.97 mg/dL).   Microbiology: Recent Results (from the past 720 hour(s))  SARS Coronavirus 2 by RT PCR (hospital order, performed in [facility] hospital lab) *cepheid single result test*     Status: None   Collection Time: 06/24/22  4:32 PM   Specimen: Nasal Swab  Result Value Ref Range Status   SARS Coronavirus 2 by RT PCR NEGATIVE NEGATIVE Final    Comment: (NOTE) SARS-CoV-2 target nucleic acids are NOT DETECTED.  The SARS-CoV-2 RNA is generally detectable in upper and lower respiratory specimens during the acute phase of infection. The lowest concentration of SARS-CoV-2 viral copies this assay can detect is 250 copies / mL. A negative result does not preclude SARS-CoV-2 infection and should not be used as the sole basis for treatment or other patient management decisions.  A  negative result may occur with improper specimen collection / handling, submission of specimen other than nasopharyngeal swab, presence of viral mutation(s) within the areas targeted by this assay, and inadequate number of viral copies (<250 copies / mL). A negative result must be combined with clinical observations, patient history, and epidemiological information.  Fact Sheet for Patients:   RoadLapTop.co.za  Fact Sheet for Healthcare Providers: http://kim-miller.com/  This test is not yet approved or  cleared by the [place] FDA and has been authorized for detection and/or diagnosis of SARS-CoV-2 by FDA under an Emergency Use Authorization (EUA).  This EUA will remain in effect (meaning this test can be used) for the duration of the COVID-19 declaration under Section 564(b)(1) of the Act, 21 U.S.C. section 360bbb-3(b)(1), unless the authorization is terminated or revoked sooner.  Performed at [facility], [address]  [place], [place] [address]   MRSA Next Gen by PCR, Nasal     Status: None   Collection Time: 06/24/22  6:07 PM   Specimen: Nasal Mucosa; Nasal Swab  Result Value Ref Range Status   MRSA by PCR Next Gen NOT DETECTED NOT DETECTED Final    Comment: (NOTE) The GeneXpert MRSA Assay (FDA approved for NASAL specimens only), is one component of a comprehensive MRSA colonization surveillance program. It is not intended to diagnose MRSA infection nor to guide or monitor treatment for MRSA infections. Test performance is not FDA approved in patients less than 1 years old. Performed at [facility], [address], [place], [place] [address]     Medical History: History reviewed. No pertinent past medical history.  Assessment: Assessment: Pharmacy consulted to monitor Na in this 46 year old male admitted with hyponatremia.    On Admission - Na 115  9/3 17:41 Na 112 9/3 21:12 Na 124 -   contacted NP, suggested may be due to lab error, NP agreed to do stat repeat of Na    Goal of Therapy:  No increase in Na > 4 mEq in 2 hrs No increase in Na > 6 mEq in 4 hrs Goal increase in Na ~ 8 mEq in 24 hrs   Plan:   9/3: Na @ 2112 = 124 , 12 mEq jump in 4 hrs ; could be lab error , will do STAT Na repeat  9/3: Na @ 2243 = 126, previous reading NOT lab error  - 3% NaCl held,  DDAVP 2 mcg IV X 1 ordered STAT   Na checks q2h x 2 then q2h  Bertil Brickey D 06/24/2022 10:31 PM

## 2022-06-24 NOTE — ED Notes (Signed)
Date and time results received: 06/24/22 1534   Test: K+ Critical Value: 2.2 Test: Sodium  Critical Value: 115  Name of Provider Notified: Paduchowski

## 2022-06-25 ENCOUNTER — Inpatient Hospital Stay
Admit: 2022-06-25 | Discharge: 2022-06-25 | Disposition: A | Discharge status: Home / Self Care | Payer: Self-pay | Attending: Critical Care Medicine | Admitting: Critical Care Medicine

## 2022-06-25 DIAGNOSIS — R569 Unspecified convulsions: Secondary | ICD-10-CM

## 2022-06-25 LAB — SODIUM
Sodium: 127 mmol/L — ABNORMAL LOW (ref 135–145)
Sodium: 126 mmol/L — ABNORMAL LOW (ref 135–145)
Sodium: 125 mmol/L — ABNORMAL LOW (ref 135–145)
Sodium: 126 mmol/L — ABNORMAL LOW (ref 135–145)
Sodium: 128 mmol/L — ABNORMAL LOW (ref 135–145)
Sodium: 122 mmol/L — ABNORMAL LOW (ref 135–145)
Sodium: 121 mmol/L — ABNORMAL LOW (ref 135–145)
Sodium: 120 mmol/L — ABNORMAL LOW (ref 135–145)
Sodium: 120 mmol/L — ABNORMAL LOW (ref 135–145)

## 2022-06-25 LAB — CBC
HCT: 37.9 % — ABNORMAL LOW (ref 39.0–52.0)
Hemoglobin: 13.9 g/dL (ref 13.0–17.0)
MCH: 35.5 pg — ABNORMAL HIGH (ref 26.0–34.0)
MCHC: 36.7 g/dL — ABNORMAL HIGH (ref 30.0–36.0)
MCV: 96.9 fL (ref 80.0–100.0)
Platelets: 217 10*3/uL (ref 150–400)
RBC: 3.91 MIL/uL — ABNORMAL LOW (ref 4.22–5.81)
RDW: 10.9 % — ABNORMAL LOW (ref 11.5–15.5)
WBC: 5.2 10*3/uL (ref 4.0–10.5)
nRBC: 0 % (ref 0.0–0.2)

## 2022-06-25 LAB — GLUCOSE, CAPILLARY
Glucose-Capillary: 138 mg/dL — ABNORMAL HIGH (ref 70–99)
Glucose-Capillary: 151 mg/dL — ABNORMAL HIGH (ref 70–99)
Glucose-Capillary: 120 mg/dL — ABNORMAL HIGH (ref 70–99)
Glucose-Capillary: 128 mg/dL — ABNORMAL HIGH (ref 70–99)
Glucose-Capillary: 108 mg/dL — ABNORMAL HIGH (ref 70–99)

## 2022-06-25 LAB — BASIC METABOLIC PANEL
Anion gap: 8 (ref 5–15)
BUN: <5 mg/dL — ABNORMAL LOW (ref 6–20)
CO2: 28 mmol/L (ref 22–32)
Calcium: 8.3 mg/dL — ABNORMAL LOW (ref 8.9–10.3)
Chloride: 90 mmol/L — ABNORMAL LOW (ref 98–111)
Creatinine, Ser: 0.88 mg/dL (ref 0.61–1.24)
GFR, Estimated: >60 mL/min (ref >60)
Glucose, Bld: 112 mg/dL — ABNORMAL HIGH (ref 70–99)
Potassium: 3.2 mmol/L — ABNORMAL LOW (ref 3.5–5.1)
Sodium: 126 mmol/L — ABNORMAL LOW (ref 135–145)

## 2022-06-25 LAB — PHOSPHORUS
Phosphorus: 3.3 mg/dL (ref 2.5–4.6)
Phosphorus: 3 mg/dL (ref 2.5–4.6)

## 2022-06-25 LAB — HEPATIC FUNCTION PANEL
ALT: 33 U/L (ref 0–44)
AST: 71 U/L — ABNORMAL HIGH (ref 15–41)
Albumin: 3.3 g/dL — ABNORMAL LOW (ref 3.5–5.0)
Alkaline Phosphatase: 54 U/L (ref 38–126)
Bilirubin, Direct: 0.2 mg/dL (ref 0.0–0.2)
Indirect Bilirubin: 0.8 mg/dL (ref 0.3–0.9)
Total Bilirubin: 1 mg/dL (ref 0.3–1.2)
Total Protein: 6 g/dL — ABNORMAL LOW (ref 6.5–8.1)

## 2022-06-25 LAB — POTASSIUM
Potassium: 3.3 mmol/L — ABNORMAL LOW (ref 3.5–5.1)
Potassium: 3.5 mmol/L (ref 3.5–5.1)

## 2022-06-25 LAB — AMMONIA: Ammonia: 11 umol/L (ref 9–35)

## 2022-06-25 LAB — MAGNESIUM
Magnesium: 2.1 mg/dL (ref 1.7–2.4)
Magnesium: 1.9 mg/dL (ref 1.7–2.4)

## 2022-06-25 MED ORDER — DEXTROSE 5 % IV BOLUS
500.0000 mL | Freq: Once | INTRAVENOUS | Status: AC
  Administered 2022-06-25: 500 mL via INTRAVENOUS

## 2022-06-25 MED ORDER — POTASSIUM CHLORIDE 10 MEQ/100ML IV SOLN
10.0000 meq | INTRAVENOUS | Status: AC
  Administered 2022-06-25 (×4): 10 meq via INTRAVENOUS
  Filled 2022-06-25 (×4): qty 100

## 2022-06-25 MED ORDER — SODIUM CHLORIDE 0.9% FLUSH
10.0000 mL | Freq: Two times a day (BID) | INTRAVENOUS | Status: DC
  Administered 2022-06-25 – 2022-06-26 (×2): 10 mL
  Administered 2022-06-26 – 2022-06-27 (×2): 20 mL

## 2022-06-25 MED ORDER — TRAMADOL HCL 50 MG PO TABS
50.0000 mg | ORAL_TABLET | Freq: Four times a day (QID) | ORAL | Status: DC | PRN
  Administered 2022-06-25 – 2022-06-27 (×4): 50 mg via ORAL
  Filled 2022-06-25 (×4): qty 1

## 2022-06-25 MED ORDER — ACETAMINOPHEN 325 MG PO TABS
650.0000 mg | ORAL_TABLET | Freq: Four times a day (QID) | ORAL | Status: DC | PRN
  Administered 2022-06-25: 650 mg via ORAL
  Filled 2022-06-25: qty 2

## 2022-06-25 MED ORDER — SODIUM CHLORIDE 0.9% FLUSH: 10.0000 mL | INTRAVENOUS | Status: DC | PRN

## 2022-06-25 MED ORDER — POTASSIUM CHLORIDE CRYS ER 20 MEQ PO TBCR
40.0000 meq | EXTENDED_RELEASE_TABLET | Freq: Once | ORAL | Status: AC
  Administered 2022-06-25: 40 meq via ORAL
  Filled 2022-06-25: qty 2

## 2022-06-25 MED ORDER — SODIUM CHLORIDE 0.9 % IV SOLN: INTRAVENOUS | Status: DC

## 2022-06-25 MED ORDER — DEXTROSE 5 % IV BOLUS: 500.0000 mL | Freq: Once | INTRAVENOUS | Status: DC

## 2022-06-25 MED ORDER — DESMOPRESSIN ACETATE 4 MCG/ML IJ SOLN
1.0000 ug | Freq: Two times a day (BID) | INTRAMUSCULAR | Status: DC
  Administered 2022-06-25: 1 ug via INTRAVENOUS
  Filled 2022-06-25: qty 1

## 2022-06-25 MED ORDER — CHLORPROMAZINE HCL 25 MG PO TABS
25.0000 mg | ORAL_TABLET | Freq: Three times a day (TID) | ORAL | Status: DC
  Administered 2022-06-25 – 2022-06-26 (×4): 25 mg via ORAL
  Filled 2022-06-25 (×6): qty 1

## 2022-06-25 MED ORDER — DESMOPRESSIN ACETATE 4 MCG/ML IJ SOLN
2.0000 ug | Freq: Once | INTRAMUSCULAR | Status: AC
  Administered 2022-06-25: 2 ug via INTRAVENOUS
  Filled 2022-06-25: qty 1

## 2022-06-25 MED ORDER — POTASSIUM CHLORIDE CRYS ER 20 MEQ PO TBCR
40.0000 meq | EXTENDED_RELEASE_TABLET | Freq: Once | ORAL | Status: AC
Start: 2022-06-25 — End: 2022-06-25
  Administered 2022-06-25: 40 meq via ORAL
  Filled 2022-06-25: qty 2

## 2022-06-25 NOTE — Progress Notes (Signed)
Pt's wife, pt's mother and father updated at bedside on plan of care.  All questions answered to their satisfaction.  Pt and family request assistance with cessation from ETOH.  Consult placed for psych.     Harlon Ditty, AGACNP-BC [facility] Prefer epic messenger for cross cover needs If after hours, please call E-link

## 2022-06-25 NOTE — Progress Notes (Signed)
PICC order received. Spoke with floor RN re: PICC order. Per RN 3% saline discontinued, will update IV/PICC after rounds if PICC still needed. Patient has working PIV. Will follow up.

## 2022-06-25 NOTE — Consult Note (Signed)
PHARMACY CONSULT NOTE - FOLLOW UP  Pharmacy Consult for Electrolyte Monitoring and Replacement   Recent Labs: Potassium (mmol/L)  Date Value  06/25/2022 3.2 (L)   Magnesium (mg/dL)  Date Value  51/11/5850 2.1   Calcium (mg/dL)  Date Value  77/82/4235 8.3 (L)   Albumin (g/dL)  Date Value  36/14/4315 3.3 (L)   Phosphorus (mg/dL)  Date Value  40/05/6760 3.3   Sodium (mmol/L)  Date Value  06/25/2022 126 (L)     Assessment: Pharmacy has been consulted to monitor and replace electrolytes in 46yo male with severe hyponatremia.   IVF: 3%NaCl@30ml /h, stopped 9/3@2314  d/t overcorrection, given DDAVP IV@2328 . D5W bolus ordered  Goal of Therapy:  Electrolytes WNL  Plan:  - Kphos x 1, KCL PO x 1, KCL IV x 4 doses all ordered by CCNP - Will continue to hold hypertonic saline and check q4h - recheck all electrolytes with AM labs  Bettey Costa ,PharmD Clinical Pharmacist 06/25/2022 7:26 AM

## 2022-06-25 NOTE — Progress Notes (Signed)
MEDICATION RELATED CONSULT NOTE - INITIAL   Pharmacy Consult for 3% NaCl Indication: Severe hyponatremia  No Known Allergies  Patient Measurements: Height: 6' (182.9 cm) Weight: 81.8 kg (180 lb 5.4 oz) IBW/kg (Calculated) : 77.6   Vital Signs: Temp: 100.1 F (37.8 C) (09/04 0400) Temp Source: Oral (09/04 0400) BP: 120/80 (09/04 0600) Pulse Rate: 87 (09/04 0600) Intake/Output from previous day: 09/03 0701 - 09/04 0700 In: 2917.9 [I.V.:926.7; IV Piggyback:1991.2] Out: 5350 [Urine:5350] Intake/Output from this shift: No intake/output data recorded.  Labs: Recent Labs    06/24/22 1445 06/24/22 1507 06/24/22 1741 06/24/22 1827 06/24/22 2112 06/25/22 0517  WBC  --  10.4 6.7  --   --  5.2  HGB  --  15.6 14.8  --   --  13.9  HCT  --  41.9 38.8*  --   --  37.9*  PLT  --  261 214  --   --  217  APTT  --   --   --   --  26  --   CREATININE  --  0.97  --   --   --  0.88  MG 1.3*  --   --  2.0  --  2.1  PHOS  --   --  1.7*  --   --  3.3  ALBUMIN  --  4.4  --   --   --  3.3*  PROT  --  7.7  --   --   --  6.0*  AST  --  106*  --   --   --  71*  ALT  --  44  --   --   --  33  ALKPHOS  --  80  --   --   --  54  BILITOT  --  1.4*  --   --   --  1.0  BILIDIR  --   --   --   --   --  0.2  IBILI  --   --   --   --   --  0.8    Estimated Creatinine Clearance: 112.7 mL/min (by C-G formula based on SCr of 0.88 mg/dL).   Microbiology: Recent Results (from the past 720 hour(s))  SARS Coronavirus 2 by RT PCR (hospital order, performed in [facility] hospital lab) *cepheid single result test*     Status: None   Collection Time: 06/24/22  4:32 PM   Specimen: Nasal Swab  Result Value Ref Range Status   SARS Coronavirus 2 by RT PCR NEGATIVE NEGATIVE Final    Comment: (NOTE) SARS-CoV-2 target nucleic acids are NOT DETECTED.  The SARS-CoV-2 RNA is generally detectable in upper and lower respiratory specimens during the acute phase of infection. The lowest concentration of  SARS-CoV-2 viral copies this assay can detect is 250 copies / mL. A negative result does not preclude SARS-CoV-2 infection and should not be used as the sole basis for treatment or other patient management decisions.  A negative result may occur with improper specimen collection / handling, submission of specimen other than nasopharyngeal swab, presence of viral mutation(s) within the areas targeted by this assay, and inadequate number of viral copies (<250 copies / mL). A negative result must be combined with clinical observations, patient history, and epidemiological information.  Fact Sheet for Patients:   RoadLapTop.co.za  Fact Sheet for Healthcare Providers: http://kim-miller.com/  This test is not yet approved or  cleared by the [place] FDA and has been  authorized for detection and/or diagnosis of SARS-CoV-2 by FDA under an Emergency Use Authorization (EUA).  This EUA will remain in effect (meaning this test can be used) for the duration of the COVID-19 declaration under Section 564(b)(1) of the Act, 21 U.S.C. section 360bbb-3(b)(1), unless the authorization is terminated or revoked sooner.  Performed at [facility], [address] [place], [place] [address]   MRSA Next Gen by PCR, Nasal     Status: None   Collection Time: 06/24/22  6:07 PM   Specimen: Nasal Mucosa; Nasal Swab  Result Value Ref Range Status   MRSA by PCR Next Gen NOT DETECTED NOT DETECTED Final    Comment: (NOTE) The GeneXpert MRSA Assay (FDA approved for NASAL specimens only), is one component of a comprehensive MRSA colonization surveillance program. It is not intended to diagnose MRSA infection nor to guide or monitor treatment for MRSA infections. Test performance is not FDA approved in patients less than 84 years old. Performed at [facility], [address] [place], [place] [address]     Medical History: History reviewed.  No pertinent past medical history.  Assessment: Assessment: Pharmacy consulted to monitor Na in this 46 year old male admitted with hyponatremia.    On Admission - Na 115  9/3 17:41 Na 112  9/3 21:12 Na 124 -  contacted NP, suggested may be due to lab error, NP agreed to do stat repeat of Na   9/3 22:42 Na 126 - previous reading valid, will hold 3% NaCl and give  DDAVP  9/4 00:34 Na 127 - D5W 500 mL X 1 ordered  9/4 02:46 Na 126 - Additional DDAVP 2 mcg IV X 1 ordered   9/4 05:17 Na 126 - DDAVP 2 mcg given @ 0448    Goal of Therapy:  No increase in Na > 4 mEq in 2 hrs No increase in Na > 6 mEq in 4 hrs Goal increase in Na ~ 8 mEq in 24 hrs   Plan:   9/3: Na @ 2112 = 124 , 12 mEq jump in 4 hrs ; could be lab error , will do STAT Na repeat  9/3: Na @ 2243 = 126, previous reading NOT lab error - 3% NaCl held,  DDAVP 2 mcg IV X 1 ordered STAT          - DDAVP given 9/3 @ 2328 9/4: Na @ 0034 = 127         - D5W 500 mL over 2 hrs ordered IV X 1 bolus  9/4: Na @ 0246 = 126         - additional DDAVP 2 mcg IV X 1 ordered.  9/4: Na @ 0517 = 126        -  DDAVP 2 mcg X 1 given @ 0448        -  NP to order additional D5W 500 mL bolus  9/4: Na @ 0705 = 125, @0839  = 126        - D5W@100ml /h currently ordered 9/4: Na = 128        - DDAVP ordered x 2 doses 9/4: Na = 122        - continue D5W@100ml /h  Na checks Q2H  Klara Stjames A Nealie Mchatton 06/25/2022 8:25 AM

## 2022-06-25 NOTE — Progress Notes (Signed)
*  PRELIMINARY RESULTS* Echocardiogram 2D Echocardiogram has been performed.  Lenor Coffin 06/25/2022, 4:26 PM

## 2022-06-25 NOTE — Progress Notes (Signed)
MEDICATION RELATED CONSULT NOTE - INITIAL   Pharmacy Consult for 3% NaCl Indication: Severe hyponatremia  No Known Allergies  Patient Measurements: Height: 6' (182.9 cm) Weight: 81.8 kg (180 lb 5.4 oz) IBW/kg (Calculated) : 77.6   Vital Signs: Temp: 98.9 F (37.2 C) (09/04 1200) Temp Source: Oral (09/04 1200) BP: 164/107 (09/04 1805) Pulse Rate: 88 (09/04 1905) Intake/Output from previous day: 09/03 0701 - 09/04 0700 In: 2917.9 [I.V.:926.7; IV Piggyback:1991.2] Out: 5350 [Urine:5350] Intake/Output from this shift: No intake/output data recorded.  Labs: Recent Labs    06/24/22 1507 06/24/22 1741 06/24/22 1827 06/24/22 2112 06/25/22 0517 06/25/22 1305  WBC 10.4 6.7  --   --  5.2  --   HGB 15.6 14.8  --   --  13.9  --   HCT 41.9 38.8*  --   --  37.9*  --   PLT 261 214  --   --  217  --   APTT  --   --   --  26  --   --   CREATININE 0.97  --   --   --  0.88  --   MG  --   --  2.0  --  2.1 1.9  PHOS  --  1.7*  --   --  3.3 3.0  ALBUMIN 4.4  --   --   --  3.3*  --   PROT 7.7  --   --   --  6.0*  --   AST 106*  --   --   --  71*  --   ALT 44  --   --   --  33  --   ALKPHOS 80  --   --   --  54  --   BILITOT 1.4*  --   --   --  1.0  --   BILIDIR  --   --   --   --  0.2  --   IBILI  --   --   --   --  0.8  --     Estimated Creatinine Clearance: 112.7 mL/min (by C-G formula based on SCr of 0.88 mg/dL).   Microbiology: Recent Results (from the past 720 hour(s))  SARS Coronavirus 2 by RT PCR (hospital order, performed in [facility] hospital lab) *cepheid single result test*     Status: None   Collection Time: 06/24/22  4:32 PM   Specimen: Nasal Swab  Result Value Ref Range Status   SARS Coronavirus 2 by RT PCR NEGATIVE NEGATIVE Final    Comment: (NOTE) SARS-CoV-2 target nucleic acids are NOT DETECTED.  The SARS-CoV-2 RNA is generally detectable in upper and lower respiratory specimens during the acute phase of infection. The lowest concentration of SARS-CoV-2  viral copies this assay can detect is 250 copies / mL. A negative result does not preclude SARS-CoV-2 infection and should not be used as the sole basis for treatment or other patient management decisions.  A negative result may occur with improper specimen collection / handling, submission of specimen other than nasopharyngeal swab, presence of viral mutation(s) within the areas targeted by this assay, and inadequate number of viral copies (<250 copies / mL). A negative result must be combined with clinical observations, patient history, and epidemiological information.  Fact Sheet for Patients:   RoadLapTop.co.za  Fact Sheet for Healthcare Providers: http://kim-miller.com/  This test is not yet approved or  cleared by the [place] FDA and has been authorized for  detection and/or diagnosis of SARS-CoV-2 by FDA under an Emergency Use Authorization (EUA).  This EUA will remain in effect (meaning this test can be used) for the duration of the COVID-19 declaration under Section 564(b)(1) of the Act, 21 U.S.C. section 360bbb-3(b)(1), unless the authorization is terminated or revoked sooner.  Performed at [facility], [address] [place], [place] [address]   MRSA Next Gen by PCR, Nasal     Status: None   Collection Time: 06/24/22  6:07 PM   Specimen: Nasal Mucosa; Nasal Swab  Result Value Ref Range Status   MRSA by PCR Next Gen NOT DETECTED NOT DETECTED Final    Comment: (NOTE) The GeneXpert MRSA Assay (FDA approved for NASAL specimens only), is one component of a comprehensive MRSA colonization surveillance program. It is not intended to diagnose MRSA infection nor to guide or monitor treatment for MRSA infections. Test performance is not FDA approved in patients less than 48 years old. Performed at [facility], [address] [place], [place] [address]     Medical History: History reviewed. No  pertinent past medical history.  Assessment: Nicholas Fletcher is a 46 y.o. male with PMH significant for ETOH Abuse and Alcoholic Hepatitis who presented with dizziness, slurred speech, chest tightness, and cramping of the hands. Last reported alcoholic drink was 8/28 or 8/29. Patient reportedly drinks 750 mL of vodka over a 2 day period and drinks daily. Wile being triaged in the ED he has a witnessed tonic-clonic seizure. Na on admission was found to be 110, and hypertonic saline was initiated. Pharmacy has been consulted to help manage hypertonic saline infusion.  Baseline: 9/3 @ 1445 Na 110   Goal of Therapy:  No increase in Na > 4 mEq in 2 hrs No increase in Na > 6 mEq in 4 hrs Goal increase in Na ~ 8 mEq in 24 hrs  Monitoring: Date  Time Na Comment/Action  9/3 1445 110 Initiate 3% saline at 30 mL/hr  9/3 1507 115 Continue 3% saline at 30 mL/hr  9/3 1741 112 Continue 3% saline at 30 mL/hr  9/3 2112 124 Contacted NP, suggested may be due to lab error, NP agreed to do stat repeat of Na  9/3 2242 126 Previous reading valid, hold 3% NaCl, DDAVP given 9/3 @ 2328  9/4 0034 127 D5W 500 mL X 1 ordered  9/4 0246 126 Additional DDAVP 2 mcg IV X 1 ordered   9/4 0517 126 DDAVP 2 mcg given @ 0448, NP to order additional D5W 500 mL bolus   9/4 0705 125 Continue D5W@100  mL/hr  9/4 0839 126 Continue D5W@100  mL/hr  9/4 1016 128 DDAVP ordered x 2 doses  9/4 1305 122 Continue D5W@100  mL/hr, DDAVP 1 mcg x1 given @ 1316  9/4 1448 121 Continue D5W@100  mL/hr  9/4 1634 120 Continue D5W@100  mL/hr, second DDAVP dose d/c'ed  9/4 2035 120 Continue D5W@100  mL/hr   Plan:  Continue serial monitoring of Na Q4H Pharmacy will monitor and contact MD per consult as needed.    Thank you for allowing pharmacy to be a part of this patient's care.  Selinda Eon Clinical Pharmacist 06/25/2022 8:57 PM

## 2022-06-25 NOTE — Progress Notes (Signed)
Eeg done 

## 2022-06-25 NOTE — Progress Notes (Addendum)
Rapid Overcorrection of Hyponatremia Patient level increased from 112 > 124. Concern for lab error with where the sample had been collected. STAT redraw however had increased to 126. Patient also appears to be self-diuresing with UOP from 1900-2300 totaling 2.6 L. - 3 % stopped, pharmacy consulted >> DDVAP 2 mcg administered - D5 NS changed to D5W @ 100 mL/h - STAT f/u 2 hours still increasing at 127 >> D5W bolus 500 mL over 2 hours - STAT f/u in 1 hour, followed by Q 2 hour monitoring - frequent neuro checks - aggressive electrolyte replacement: K+, Mg, Phos No current changes in mentation or neurological status.     Additional CC time 30 minutes   Betsey Holiday, AGACNP-BC Acute Care Nurse Practitioner [facility]   [phone] / [phone] Please see Amion for pager details.

## 2022-06-25 NOTE — Progress Notes (Signed)
MEDICATION RELATED CONSULT NOTE - INITIAL   Pharmacy Consult for 3% NaCl Indication: Severe hyponatremia  No Known Allergies  Patient Measurements: Height: 6' (182.9 cm) Weight: 81.8 kg (180 lb 5.4 oz) IBW/kg (Calculated) : 77.6   Vital Signs: Temp: 98.9 F (37.2 C) (09/04 1200) Temp Source: Oral (09/04 1200) BP: 149/96 (09/04 1500) Pulse Rate: 81 (09/04 1500) Intake/Output from previous day: 09/03 0701 - 09/04 0700 In: 2917.9 [I.V.:926.7; IV Piggyback:1991.2] Out: 5350 [Urine:5350] Intake/Output from this shift: Total I/O In: 1857.2 [P.O.:480; I.V.:645.1; Other:100; IV Piggyback:632.1] Out: -   Labs: Recent Labs    06/24/22 1507 06/24/22 1741 06/24/22 1827 06/24/22 2112 06/25/22 0517 06/25/22 1305  WBC 10.4 6.7  --   --  5.2  --   HGB 15.6 14.8  --   --  13.9  --   HCT 41.9 38.8*  --   --  37.9*  --   PLT 261 214  --   --  217  --   APTT  --   --   --  26  --   --   CREATININE 0.97  --   --   --  0.88  --   MG  --   --  2.0  --  2.1 1.9  PHOS  --  1.7*  --   --  3.3 3.0  ALBUMIN 4.4  --   --   --  3.3*  --   PROT 7.7  --   --   --  6.0*  --   AST 106*  --   --   --  71*  --   ALT 44  --   --   --  33  --   ALKPHOS 80  --   --   --  54  --   BILITOT 1.4*  --   --   --  1.0  --   BILIDIR  --   --   --   --  0.2  --   IBILI  --   --   --   --  0.8  --     Estimated Creatinine Clearance: 112.7 mL/min (by C-G formula based on SCr of 0.88 mg/dL).   Microbiology: Recent Results (from the past 720 hour(s))  SARS Coronavirus 2 by RT PCR (hospital order, performed in [facility] hospital lab) *cepheid single result test*     Status: None   Collection Time: 06/24/22  4:32 PM   Specimen: Nasal Swab  Result Value Ref Range Status   SARS Coronavirus 2 by RT PCR NEGATIVE NEGATIVE Final    Comment: (NOTE) SARS-CoV-2 target nucleic acids are NOT DETECTED.  The SARS-CoV-2 RNA is generally detectable in upper and lower respiratory specimens during the acute phase of  infection. The lowest concentration of SARS-CoV-2 viral copies this assay can detect is 250 copies / mL. A negative result does not preclude SARS-CoV-2 infection and should not be used as the sole basis for treatment or other patient management decisions.  A negative result may occur with improper specimen collection / handling, submission of specimen other than nasopharyngeal swab, presence of viral mutation(s) within the areas targeted by this assay, and inadequate number of viral copies (<250 copies / mL). A negative result must be combined with clinical observations, patient history, and epidemiological information.  Fact Sheet for Patients:   RoadLapTop.co.za  Fact Sheet for Healthcare Providers: http://kim-miller.com/  This test is not yet approved or  cleared by the  [place] [employer] and has been authorized for detection and/or diagnosis of SARS-CoV-2 by FDA under an TEFL teacher (EUA).  This EUA will remain in effect (meaning this test can be used) for the duration of the COVID-19 declaration under Section 564(b)(1) of the Act, 21 U.S.C. section 360bbb-3(b)(1), unless the authorization is terminated or revoked sooner.  Performed at [facility], [address] [place], [place] [address]   MRSA Next Gen by PCR, Nasal     Status: None   Collection Time: 06/24/22  6:07 PM   Specimen: Nasal Mucosa; Nasal Swab  Result Value Ref Range Status   MRSA by PCR Next Gen NOT DETECTED NOT DETECTED Final    Comment: (NOTE) The GeneXpert MRSA Assay (FDA approved for NASAL specimens only), is one component of a comprehensive MRSA colonization surveillance program. It is not intended to diagnose MRSA infection nor to guide or monitor treatment for MRSA infections. Test performance is not FDA approved in patients less than 5 years old. Performed at [facility], [address] [place], [place] [address]      Medical History: History reviewed. No pertinent past medical history.  Assessment: Nicholas Fletcher is a 46 y.o. male with PMH significant for ETOH Abuse and Alcoholic Hepatitis who presented with dizziness, slurred speech, chest tightness, and cramping of the hands. Last reported alcoholic drink was 8/28 or 8/29. Patient reportedly drinks 750 mL of vodka over a 2 day period and drinks daily. Wile being triaged in the ED he has a witnessed tonic-clonic seizure. Na on admission was found to be 110, and hypertonic saline was initiated. Pharmacy has been consulted to help manage hypertonic saline infusion.  Baseline: 9/3 @ 1445 Na 110   Goal of Therapy:  No increase in Na > 4 mEq in 2 hrs No increase in Na > 6 mEq in 4 hrs Goal increase in Na ~ 8 mEq in 24 hrs  Monitoring: Date  Time Na Comment/Action  9/3 1445 110 Initiate 3% saline at 30 mL/hr  9/3 1507 115 Continue 3% saline at 30 mL/hr  9/3 1741 112 Continue 3% saline at 30 mL/hr  9/3 2112 124 Contacted NP, suggested may be due to lab error, NP agreed to do stat repeat of Na  9/3 2242 126 Previous reading valid, hold 3% NaCl, DDAVP given 9/3 @ 2328  9/4 0034 127 D5W 500 mL X 1 ordered  9/4 0246 126 Additional DDAVP 2 mcg IV X 1 ordered   9/4 0517 126 DDAVP 2 mcg given @ 0448, NP to order additional D5W 500 mL bolus   9/4 0705 125 Continue D5W@100  mL/hr  9/4 0839 126 Continue D5W@100  mL/hr  9/4 1016 128 DDAVP ordered x 2 doses  9/4 1305 122 Continue D5W@100  mL/hr, DDAVP 1 mcg x1 given @ 1316  9/4 1448 121 Continue D5W@100  mL/hr  9/4 1634 120 Continue D5W@100  mL/hr, second DDAVP dose d/c'ed   Plan:  Continue serial monitoring of Na Q2H Pharmacy will monitor and contact MD per consult as needed.   Thank you for allowing pharmacy to be a part of this patient's care.  Celene Squibb, PharmD PGY1 Pharmacy Resident 06/25/2022 3:10 PM

## 2022-06-25 NOTE — Procedures (Addendum)
Patient Name: Nicholas Fletcher  MRN: [id number]  Epilepsy Attending: Charlsie Quest  Referring Physician/Provider: Ezequiel Essex, NP Date:  06/25/2022 Duration: 20.43 mins  Patient history: 46yo M with ams and seizure in witting of alcohol withdrawal. EEG to evaluate for seizure  Level of alertness: Awake, asleep  AEDs during EEG study: None  Technical aspects: This EEG study was done with scalp electrodes positioned according to the 10-20 International system of electrode placement. Electrical activity was reviewed with band pass filter of 1-70Hz , sensitivity of 7 uV/mm, display speed of 6mm/sec with a 60Hz  notched filter applied as appropriate. EEG data were recorded continuously and digitally stored.  Video monitoring was available and reviewed as appropriate.  Description: The posterior dominant rhythm consists of 9-10 Hz activity of moderate voltage (25-35 uV) seen predominantly in posterior head regions, symmetric and reactive to eye opening and eye closing. Sleep was characterized by vertex waves, sleep spindles (12 to 14 Hz), maximal frontocentral region.  There is an excessive amount of 15 to 18 Hz beta activity distributed symmetrically and diffusely. Physiologic photic driving was seen during photic stimulation.  Hyperventilation was not performed.     ABNORMALITY - Excessive beta, generalized  IMPRESSION: This study is within normal limits. The excessive beta activity seen in the background is most likely due to the effect of benzodiazepine and is a benign EEG pattern. No seizures or epileptiform discharges were seen throughout the recording.  A normal interictal EEG does not exclude nor support the diagnosis of epilepsy.  Juris Gosnell 

## 2022-06-25 NOTE — Progress Notes (Signed)
NAME:  Nicholas Fletcher, MRN:  [id number], DOB:  [birth year], LOS: 1 ADMISSION DATE:  06/24/2022, CONSULTATION DATE: 06/24/22 REFERRING MD: Dr. Lenard Lance , CHIEF COMPLAINT: Dizziness   History of Present Illness:  This is a 46 yo male with a hx of ETOH Abuse and Alcoholic Hepatitis who presented to [facility] ER on 09/3 with dizziness, slurred speech, chest tightness, and cramping of the hands.  Pts wife reported the slurred speech started today at 1300, and pt has not been "acting like himself."  Pt reports de drinks 750 ml of vodka over a 2 day period and drinks daily.  His last alcoholic beverage was last Monday or Tuesday.  He also endorses he started vaping 3-4 months ago.  His wife reports he also vomited the night of 09/2.    ED Course  While being triaged in the ER pt noted to have a witnessed tonic- clonic seizure.  Per ER documentation following seizure activity pt somewhat confused and somnolent. Pt received 2 mg of iv ativan.  He denies any previous hx of seizures.  CT Head negative for acute intracranial abnormality.  Lab results revealed  Na+ 110, K+ 2.2, chloride 77, CO2 20, glucose 151, BUN <5, anion gap 18, magnesium 1.3, and osmolality 232.  Urine drug screen negative, alcohol level <10, urine osmolality 98, urine sodium <10, urine specific gravity 1.003, and UA negative for UTI.  Pt received 1L NS bolus, however he remained hyponatremic with repeat Na+ 115 requiring 3% saline gtt.  PCCM team contacted by by ER provider for ICUD admission .   Initial EKG: Sinus rhythm, heart rate 83, non specific T wave abnormality, prolonged QTc 605 Repeat EKG: Sinus rhythm hr 83 with intermittent PVC's, prolonged QTc 515 CXR: negative for cardiopulmonary disease   Pertinent  Medical History  ETOH Abuse  Vape Use Alcoholic Hepatitis  Severe Esophagitis  GI Bleed  Diverticulitis  Hepatic Steatosis    Significant Hospital Events: Including procedures, antibiotic start and stop dates in addition to  other pertinent events   09/3: Pt admitted with tonic-clonic seizure activity, severe hyponatremia, acute metabolic encephalopathy, severe electrolyte derangements, and ETOH withdrawal requiring hypertonic saline 09/4: Rapid Overcorrection of Na overnight (Na 112 >127) s/p DDAVP x2 and D5W bolus.  Na now holding steady.  Neurologically asymptomatic, consult IV team for PICC Placement.  Interim History / Subjective:  -Overnight with rapid overcorrection of Na from 112 to 127 -Suspect Hyponatremia due to volume depletion in setting of ETOH, with correction of hypovolemia inhibiting ADH with resultant water diuresis ~ UOP 5.3 L last 24 h (net - 2.4 L)  -3% Hypertonic saline was discontinued overnight, received DDAVP x2 and bolus of D5W ~ Na has held at 126 over last 2 checks -Will place on DDAVP BID for 2 more doses today, continue D5W  -Pt is neurologically intact, A&O x3, wanting to eat and drink -Nephrology consult is pending -Will consult IV team for PICC placement due to frequent lab draws and multiple infusions for electrolyte replacement   Objective   Blood pressure 120/80, pulse 87, temperature 100.1 F (37.8 C), temperature source Oral, resp. rate 17, height 6' (1.829 m), weight 81.8 kg, SpO2 99 %.        Intake/Output Summary (Last 24 hours) at 06/25/2022 0703 Last data filed at 06/25/2022 7425 Gross per 24 hour  Intake 2917.92 ml  Output 5350 ml  Net -2432.08 ml    Filed Weights   06/24/22 1449 06/24/22 1759 06/25/22 0600  Weight: 79.4 kg 80.4 kg 81.8 kg    Examination: General: Acutely-ill appearing male, resting in bed NAD HENT: Supple, no JVD  Lungs: Clear throughout, even, non labored  Cardiovascular: Sinus rhythm, s1s2, no r/g, 2+ radial/2+ distal pulses, no edema Abdomen: +BS x4, soft, non tender, non distended  Extremities: Normal bulk and tone, moves all extremities  Neuro: Lethargic, arouses easily to voice, A&O x3, following commands, no focal deficits PERRLA   GU: Deferred   Resolved Hospital Problem list     Assessment & Plan:  Acute hypovolemic hyponatremia secondary to ETOH abuse  Suspect Hyponatremia due to volume depletion in setting of ETOH, with correction of hypovolemia inhibiting ADH with resultant water diuresis Hypokalemia Hypomagnesia ~ RESOLVED  Hypophosphatemia -Monitor I&O's / urinary output -Follow BMP -Ensure adequate renal perfusion -Avoid nephrotoxic agents as able -Replace electrolytes as indicated -Pharmacy following for assistance with electrolyte replacement and Hyponatremia -3% Hypertonic saline d/c -Goal Na correction of 6-8 mEq per 24 hrs -Serum Na checks q4h -received DDAVP x2 and bolus of D5W ~ Na has held at 126 over last 2 checks -will place on DDAVP BID for 2 more doses through today, continue D5W  -Nephrology consulted, appreciate input  Acute metabolic encephalopathy  Seizure activity secondary to acute hyponatremia or ETOH withdrawal  ETOH withdrawal  -Treat Hyponatremia as outlined above -Provide supportive care - Seizure precautions  - Prn ativan for seizure activity  - EEG pending  - Neuro checks q4hrs  - High dose thiamine for 4 days   - MVI and folic acid - Prn ativan per CIWA protocol  - Supportive care  - ETOH and vape use cessation counseling provided  - Transition of care team consulted for substance abuse counseling/education   Prolonged QTc ~ RESOLVED - Continuous telemetry monitoring - Avoid QTc prolonging medications  - Follow EKG following electrolyte replacement  - Troponin negative x2  - Echo pending   Elevated AST secondary to chronic alcohol use  Hyperammoniemia Hx: Severe esophagitis  - Trend coags and hepatic function panel  - Ammonia is normal - PPI q12hrs      Best Practice (right click and "Reselect all SmartList Selections" daily)   Diet/type: Regular diet DVT prophylaxis: LMWH GI prophylaxis: PPI Lines: N/A Foley:  N/A Code Status:  full code Last  date of multidisciplinary goals of care discussion [N/A]  Pt updated at bedside.  All questions answered  Labs   CBC: Recent Labs  Lab 06/24/22 1507 06/24/22 1741 06/25/22 0517  WBC 10.4 6.7 5.2  HGB 15.6 14.8 13.9  HCT 41.9 38.8* 37.9*  MCV 94.4 93.3 96.9  PLT 261 214 217     Basic Metabolic Panel: Recent Labs  Lab 06/24/22 1445 06/24/22 1507 06/24/22 1741 06/24/22 1827 06/24/22 2112 06/24/22 2242 06/25/22 0034 06/25/22 0246 06/25/22 0517  NA 110* 115* 112*  --  124* 126* 127* 126* 126*  K  --  2.2*  --  2.7* 2.4*  --   --  3.3* 3.2*  CL  --  77*  --   --   --   --   --   --  90*  CO2  --  20*  --   --   --   --   --   --  28  GLUCOSE  --  151*  --   --   --   --   --   --  112*  BUN  --  <5*  --   --   --   --   --   --  <  5*  CREATININE  --  0.97  --   --   --   --   --   --  0.88  CALCIUM  --  9.3  --   --   --   --   --   --  8.3*  MG 1.3*  --   --  2.0  --   --   --   --  2.1  PHOS  --   --  1.7*  --   --   --   --   --  3.3    GFR: Estimated Creatinine Clearance: 112.7 mL/min (by C-G formula based on SCr of 0.88 mg/dL). Recent Labs  Lab 06/24/22 1507 06/24/22 1741 06/25/22 0517  WBC 10.4 6.7 5.2     Liver Function Tests: Recent Labs  Lab 06/24/22 1507 06/25/22 0517  AST 106* 71*  ALT 44 33  ALKPHOS 80 54  BILITOT 1.4* 1.0  PROT 7.7 6.0*  ALBUMIN 4.4 3.3*    No results for input(s): "LIPASE", "AMYLASE" in the last 168 hours. Recent Labs  Lab 06/24/22 1507 06/25/22 0517  AMMONIA 78* 11     ABG No results found for: "PHART", "PCO2ART", "PO2ART", "HCO3", "TCO2", "ACIDBASEDEF", "O2SAT"   Coagulation Profile: Recent Labs  Lab 06/24/22 2112  INR 0.9    Cardiac Enzymes: No results for input(s): "CKTOTAL", "CKMB", "CKMBINDEX", "TROPONINI" in the last 168 hours.  HbA1C: No results found for: "HGBA1C"  CBG: Recent Labs  Lab 06/24/22 1445 06/24/22 1758 06/24/22 1933 06/24/22 2353 06/25/22 0317  GLUCAP 149* 104* 118* 123*  138*     Review of Systems: Positives in BOLD   Pt currently denies all complaints Gen: cramping of hands, fever, chills, weight change, fatigue, night sweats HEENT: Denies blurred vision, double vision, hearing loss, tinnitus, sinus congestion, rhinorrhea, sore throat, neck stiffness, dysphagia PULM: Denies shortness of breath, cough, sputum production, hemoptysis, wheezing CV: chest tightness, edema, orthopnea, paroxysmal nocturnal dyspnea, palpitations GI: abdominal pain, nausea, vomiting, diarrhea, hematochezia, melena, constipation, change in bowel habits GU: Denies dysuria, hematuria, polyuria, oliguria, urethral discharge Endocrine: Denies hot or cold intolerance, polyuria, polyphagia or appetite change Derm: Denies rash, dry skin, scaling or peeling skin change Heme: Denies easy bruising, bleeding, bleeding gums Neuro: headache, numbness, weakness, slurred speech, confusion, seizure activity, loss of memory or consciousness   Past Medical History:  He,  has no past medical history on file.   Surgical History:   Past Surgical History:  Procedure Laterality Date   COLONOSCOPY WITH PROPOFOL N/A 09/26/2021   Procedure: COLONOSCOPY WITH PROPOFOL;  Surgeon: Regis Bill, MD;  Location: [facility] ENDOSCOPY;  Service: Endoscopy;  Laterality: N/A;   COLONOSCOPY WITH PROPOFOL N/A 09/28/2021   Procedure: COLONOSCOPY WITH PROPOFOL;  Surgeon: Regis Bill, MD;  Location: [facility] ENDOSCOPY;  Service: Endoscopy;  Laterality: N/A;   ESOPHAGOGASTRODUODENOSCOPY (EGD) WITH PROPOFOL N/A 09/26/2021   Procedure: ESOPHAGOGASTRODUODENOSCOPY (EGD) WITH PROPOFOL;  Surgeon: Regis Bill, MD;  Location: [facility] ENDOSCOPY;  Service: Endoscopy;  Laterality: N/A;   TONSILLECTOMY       Social History:   reports that he has been smoking cigars. He has never used smokeless tobacco. He reports current alcohol use. He reports that he does not currently use drugs.   Family History:  His Family  history is unknown by patient.   Allergies No Known Allergies   Home Medications  Prior to Admission medications   Medication Sig Start Date End Date Taking? Authorizing Provider  ibuprofen (  ADVIL) 600 MG tablet Take 600 mg by mouth daily.    [provider]  oxyCODONE-acetaminophen (PERCOCET/ROXICET) 5-325 MG tablet Take 1 tablet by mouth every 4 (four) hours as needed for severe pain. Patient not taking: Reported on 09/24/2021 07/17/19   Tanda Rockers, PA-C  pantoprazole (PROTONIX) 40 MG tablet Take 1 tablet (40 mg total) by mouth 2 (two) times daily before a meal. 09/29/21 12/28/21  Dahal, Melina Schools, MD  sucralfate (CARAFATE) 1 GM/10ML suspension Take 10 mLs (1 g total) by mouth 4 (four) times daily. 09/29/21 12/28/21  Lorin Glass, MD     Critical care time: 40 minutes   Harlon Ditty, AGACNP-BC  Pulmonary & Critical Care Prefer epic messenger for cross cover needs If after hours, please call E-link

## 2022-06-25 NOTE — Progress Notes (Signed)
Peripherally Inserted Central Catheter Placement  The IV Nurse has discussed with the patient and/or persons authorized to consent for the patient, the purpose of this procedure and the potential benefits and risks involved with this procedure.  The benefits include less needle sticks, lab draws from the catheter, and the patient may be discharged home with the catheter. Risks include, but not limited to, infection, bleeding, blood clot (thrombus formation), and puncture of an artery; nerve damage and irregular heartbeat and possibility to perform a PICC exchange if needed/ordered by physician.  Alternatives to this procedure were also discussed.  Bard Power PICC patient education guide, fact sheet on infection prevention and patient information card has been provided to patient /or left at bedside.    PICC Placement Documentation  PICC Double Lumen 06/25/22 Right Brachial 38 cm 0 cm (Active)  Indication for Insertion or Continuance of Line Administration of hyperosmolar/irritating solutions (i.e. TPN, Vancomycin, etc.) 06/25/22 1726  Site Assessment Clean, Dry, Intact 06/25/22 1726  Lumen #2 Status Flushed;Saline locked;Blood return noted 06/25/22 1726  Dressing Type Transparent;Securing device 06/25/22 1726  Dressing Status Antimicrobial disc in place 06/25/22 1726  Dressing Intervention New dressing;Other (Comment) 06/25/22 1726  Dressing Change Due 07/02/22 06/25/22 1726       Nicholas Fletcher 06/25/2022, 5:27 PM

## 2022-06-25 NOTE — Progress Notes (Addendum)
MEDICATION RELATED CONSULT NOTE - INITIAL   Pharmacy Consult for 3% NaCl Indication: Severe hyponatremia  No Known Allergies  Patient Measurements: Height: 6' (182.9 cm) Weight: 80.4 kg (177 lb 4 oz) IBW/kg (Calculated) : 77.6   Vital Signs: Temp: 98.9 F (37.2 C) (09/04 0000) Temp Source: Oral (09/04 0000) BP: 130/74 (09/04 0000) Pulse Rate: 92 (09/04 0000) Intake/Output from previous day: 09/03 0701 - 09/04 0700 In: 1593.2 [I.V.:493.6; IV Piggyback:1099.6] Out: 4200 [Urine:4200] Intake/Output from this shift: Total I/O In: 479.4 [I.V.:368; IV Piggyback:111.4] Out: 2600 [Urine:2600]  Labs: Recent Labs    06/24/22 1445 06/24/22 1507 06/24/22 1741 06/24/22 1827 06/24/22 2112  WBC  --  10.4 6.7  --   --   HGB  --  15.6 14.8  --   --   HCT  --  41.9 38.8*  --   --   PLT  --  261 214  --   --   APTT  --   --   --   --  26  CREATININE  --  0.97  --   --   --   MG 1.3*  --   --  2.0  --   PHOS  --   --  1.7*  --   --   ALBUMIN  --  4.4  --   --   --   PROT  --  7.7  --   --   --   AST  --  106*  --   --   --   ALT  --  44  --   --   --   ALKPHOS  --  80  --   --   --   BILITOT  --  1.4*  --   --   --     Estimated Creatinine Clearance: 102.2 mL/min (by C-G formula based on SCr of 0.97 mg/dL).   Microbiology: Recent Results (from the past 720 hour(s))  SARS Coronavirus 2 by RT PCR (hospital order, performed in [facility] hospital lab) *cepheid single result test*     Status: None   Collection Time: 06/24/22  4:32 PM   Specimen: Nasal Swab  Result Value Ref Range Status   SARS Coronavirus 2 by RT PCR NEGATIVE NEGATIVE Final    Comment: (NOTE) SARS-CoV-2 target nucleic acids are NOT DETECTED.  The SARS-CoV-2 RNA is generally detectable in upper and lower respiratory specimens during the acute phase of infection. The lowest concentration of SARS-CoV-2 viral copies this assay can detect is 250 copies / mL. A negative result does not preclude SARS-CoV-2  infection and should not be used as the sole basis for treatment or other patient management decisions.  A negative result may occur with improper specimen collection / handling, submission of specimen other than nasopharyngeal swab, presence of viral mutation(s) within the areas targeted by this assay, and inadequate number of viral copies (<250 copies / mL). A negative result must be combined with clinical observations, patient history, and epidemiological information.  Fact Sheet for Patients:   RoadLapTop.co.za  Fact Sheet for Healthcare Providers: http://kim-miller.com/  This test is not yet approved or  cleared by the [place] FDA and has been authorized for detection and/or diagnosis of SARS-CoV-2 by FDA under an Emergency Use Authorization (EUA).  This EUA will remain in effect (meaning this test can be used) for the duration of the COVID-19 declaration under Section 564(b)(1) of the Act, 21 U.S.C. section 360bbb-3(b)(1), unless the authorization  is terminated or revoked sooner.  Performed at [facility], [address] [place], [place] [address]   MRSA Next Gen by PCR, Nasal     Status: None   Collection Time: 06/24/22  6:07 PM   Specimen: Nasal Mucosa; Nasal Swab  Result Value Ref Range Status   MRSA by PCR Next Gen NOT DETECTED NOT DETECTED Final    Comment: (NOTE) The GeneXpert MRSA Assay (FDA approved for NASAL specimens only), is one component of a comprehensive MRSA colonization surveillance program. It is not intended to diagnose MRSA infection nor to guide or monitor treatment for MRSA infections. Test performance is not FDA approved in patients less than 49 years old. Performed at [facility], [address] [place], [place] [address]     Medical History: History reviewed. No pertinent past medical history.  Assessment: Assessment: Pharmacy consulted to monitor Na in this 46 year  old male admitted with hyponatremia.    On Admission - Na 115  9/3 17:41 Na 112  9/3 21:12 Na 124 -  contacted NP, suggested may be due to lab error, NP agreed to do stat repeat of Na   9/3 22:42 Na 126 - previous reading valid, will hold 3% NaCl and give  DDAVP  9/4 00:34 Na 127 - D5W 500 mL X 1 ordered  9/4 02:46 Na 126 - Additional DDAVP 2 mcg IV X 1 ordered     Goal of Therapy:  No increase in Na > 4 mEq in 2 hrs No increase in Na > 6 mEq in 4 hrs Goal increase in Na ~ 8 mEq in 24 hrs   Plan:   9/3: Na @ 2112 = 124 , 12 mEq jump in 4 hrs ; could be lab error , will do STAT Na repeat  9/3: Na @ 2243 = 126, previous reading NOT lab error  - 3% NaCl held,  DDAVP 2 mcg IV X 1 ordered STAT          - DDAVP given 9/3 @ 2328  9/4: Na @ 0034 = 127         - D5W 500 mL over 2 hrs ordered IV X 1 bolus   9/4: Na @ 0246 = 126         - additional DDAVP 2 mcg IV X 1 ordered.   Na checks Q4H  Alonza Knisley D 06/25/2022 1:47 AM

## 2022-06-25 NOTE — Progress Notes (Addendum)
MEDICATION RELATED CONSULT NOTE - INITIAL   Pharmacy Consult for 3% NaCl Indication: Severe hyponatremia  No Known Allergies  Patient Measurements: Height: 6' (182.9 cm) Weight: 80.4 kg (177 lb 4 oz) IBW/kg (Calculated) : 77.6   Vital Signs: Temp: 100.1 F (37.8 C) (09/04 0400) Temp Source: Oral (09/04 0400) BP: 141/88 (09/04 0500) Pulse Rate: 84 (09/04 0500) Intake/Output from previous day: 09/03 0701 - 09/04 0700 In: 2609.1 [I.V.:693.5; IV Piggyback:1915.7] Out: 5100 [Urine:5100] Intake/Output from this shift: Total I/O In: 1495.3 [I.V.:567.8; IV Piggyback:927.5] Out: 3500 [Urine:3500]  Labs: Recent Labs    06/24/22 1445 06/24/22 1507 06/24/22 1741 06/24/22 1827 06/24/22 2112 06/25/22 0517  WBC  --  10.4 6.7  --   --   --   HGB  --  15.6 14.8  --   --   --   HCT  --  41.9 38.8*  --   --   --   PLT  --  261 214  --   --   --   APTT  --   --   --   --  26  --   CREATININE  --  0.97  --   --   --  0.88  MG 1.3*  --   --  2.0  --  2.1  PHOS  --   --  1.7*  --   --  3.3  ALBUMIN  --  4.4  --   --   --  3.3*  PROT  --  7.7  --   --   --  6.0*  AST  --  106*  --   --   --  71*  ALT  --  44  --   --   --  33  ALKPHOS  --  80  --   --   --  54  BILITOT  --  1.4*  --   --   --  1.0  BILIDIR  --   --   --   --   --  0.2  IBILI  --   --   --   --   --  0.8    Estimated Creatinine Clearance: 112.7 mL/min (by C-G formula based on SCr of 0.88 mg/dL).   Microbiology: Recent Results (from the past 720 hour(s))  SARS Coronavirus 2 by RT PCR (hospital order, performed in [facility] hospital lab) *cepheid single result test*     Status: None   Collection Time: 06/24/22  4:32 PM   Specimen: Nasal Swab  Result Value Ref Range Status   SARS Coronavirus 2 by RT PCR NEGATIVE NEGATIVE Final    Comment: (NOTE) SARS-CoV-2 target nucleic acids are NOT DETECTED.  The SARS-CoV-2 RNA is generally detectable in upper and lower respiratory specimens during the acute phase of  infection. The lowest concentration of SARS-CoV-2 viral copies this assay can detect is 250 copies / mL. A negative result does not preclude SARS-CoV-2 infection and should not be used as the sole basis for treatment or other patient management decisions.  A negative result may occur with improper specimen collection / handling, submission of specimen other than nasopharyngeal swab, presence of viral mutation(s) within the areas targeted by this assay, and inadequate number of viral copies (<250 copies / mL). A negative result must be combined with clinical observations, patient history, and epidemiological information.  Fact Sheet for Patients:   RoadLapTop.co.za  Fact Sheet for Healthcare Providers: http://kim-miller.com/  This test is  not yet approved or  cleared by the [place] and has been authorized for detection and/or diagnosis of SARS-CoV-2 by FDA under an Emergency Use Authorization (EUA).  This EUA will remain in effect (meaning this test can be used) for the duration of the COVID-19 declaration under Section 564(b)(1) of the Act, 21 U.S.C. section 360bbb-3(b)(1), unless the authorization is terminated or revoked sooner.  Performed at [facility], [address] [place], [place] [address]   MRSA Next Gen by PCR, Nasal     Status: None   Collection Time: 06/24/22  6:07 PM   Specimen: Nasal Mucosa; Nasal Swab  Result Value Ref Range Status   MRSA by PCR Next Gen NOT DETECTED NOT DETECTED Final    Comment: (NOTE) The GeneXpert MRSA Assay (FDA approved for NASAL specimens only), is one component of a comprehensive MRSA colonization surveillance program. It is not intended to diagnose MRSA infection nor to guide or monitor treatment for MRSA infections. Test performance is not FDA approved in patients less than 81 years old. Performed at [facility], [address] [place], [place] [address]      Medical History: History reviewed. No pertinent past medical history.  Assessment: Assessment: Pharmacy consulted to monitor Na in this 46 year old male admitted with hyponatremia.    On Admission - Na 115  9/3 17:41 Na 112  9/3 21:12 Na 124 -  contacted NP, suggested may be due to lab error, NP agreed to do stat repeat of Na   9/3 22:42 Na 126 - previous reading valid, will hold 3% NaCl and give  DDAVP  9/4 00:34 Na 127 - D5W 500 mL X 1 ordered  9/4 02:46 Na 126 - Additional DDAVP 2 mcg IV X 1 ordered   9/4 05:17 Na 126 - DDAVP 2 mcg given @ 0448    Goal of Therapy:  No increase in Na > 4 mEq in 2 hrs No increase in Na > 6 mEq in 4 hrs Goal increase in Na ~ 8 mEq in 24 hrs   Plan:   9/3: Na @ 2112 = 124 , 12 mEq jump in 4 hrs ; could be lab error , will do STAT Na repeat  9/3: Na @ 2243 = 126, previous reading NOT lab error  - 3% NaCl held,  DDAVP 2 mcg IV X 1 ordered STAT          - DDAVP given 9/3 @ 2328  9/4: Na @ 0034 = 127         - D5W 500 mL over 2 hrs ordered IV X 1 bolus   9/4: Na @ 0246 = 126         - additional DDAVP 2 mcg IV X 1 ordered.   9/4: Na @ 0517 = 126        -  DDAVP 2 mcg X 1 given @ 0448        -  NP to order additional D5W 500 mL bolus   Na checks Q4H  Ikeisha Blumberg D 06/25/2022 5:55 AM

## 2022-06-25 NOTE — Progress Notes (Signed)
Pt's latest Na has decreased to 120 from 121 and 122 on previous checks with D5W and DDAVP (approximate correction of approximately 10 points from admission yesterday).  Pt is now eating and drinking.    Discussed with Dr. Jayme Cloud, will d/c D5W and DDAVP and allow pt to equilibrate on his own.     Harlon Ditty, AGACNP-BC [facility] Prefer epic messenger for cross cover needs If after hours, please call E-link

## 2022-06-26 ENCOUNTER — Inpatient Hospital Stay: Payer: Self-pay

## 2022-06-26 DIAGNOSIS — F1090 Alcohol use, unspecified, uncomplicated: Secondary | ICD-10-CM | POA: Diagnosis present

## 2022-06-26 LAB — CBC
HCT: 33.2 % — ABNORMAL LOW (ref 39.0–52.0)
Hemoglobin: 12.2 g/dL — ABNORMAL LOW (ref 13.0–17.0)
MCH: 35.3 pg — ABNORMAL HIGH (ref 26.0–34.0)
MCHC: 36.7 g/dL — ABNORMAL HIGH (ref 30.0–36.0)
MCV: 96 fL (ref 80.0–100.0)
Platelets: 211 10*3/uL (ref 150–400)
RBC: 3.46 MIL/uL — ABNORMAL LOW (ref 4.22–5.81)
RDW: 10.9 % — ABNORMAL LOW (ref 11.5–15.5)
WBC: 3.5 10*3/uL — ABNORMAL LOW (ref 4.0–10.5)
nRBC: 0 % (ref 0.0–0.2)

## 2022-06-26 LAB — MAGNESIUM: Magnesium: 1.7 mg/dL (ref 1.7–2.4)

## 2022-06-26 LAB — ECHOCARDIOGRAM COMPLETE
AR max vel: 2.82 cm2
AV Peak grad: 4.9 mmHg
Ao pk vel: 1.11 m/s
Area-P 1/2: 4.41 cm2
Calc EF: 56.9 %
Height: 72 in
S' Lateral: 3.33 cm
Single Plane A2C EF: 64.6 %
Single Plane A4C EF: 44.2 %
Weight: 2885.38 oz

## 2022-06-26 LAB — BASIC METABOLIC PANEL
Anion gap: 5 (ref 5–15)
BUN: <5 mg/dL — ABNORMAL LOW (ref 6–20)
CO2: 27 mmol/L (ref 22–32)
Calcium: 8.5 mg/dL — ABNORMAL LOW (ref 8.9–10.3)
Chloride: 92 mmol/L — ABNORMAL LOW (ref 98–111)
Creatinine, Ser: 0.7 mg/dL (ref 0.61–1.24)
GFR, Estimated: >60 mL/min (ref >60)
Glucose, Bld: 101 mg/dL — ABNORMAL HIGH (ref 70–99)
Potassium: 3.7 mmol/L (ref 3.5–5.1)
Sodium: 124 mmol/L — ABNORMAL LOW (ref 135–145)

## 2022-06-26 LAB — HEPATIC FUNCTION PANEL
ALT: 31 U/L (ref 0–44)
AST: 151 U/L — ABNORMAL HIGH (ref 15–41)
Albumin: 3.1 g/dL — ABNORMAL LOW (ref 3.5–5.0)
Alkaline Phosphatase: 54 U/L (ref 38–126)
Bilirubin, Direct: 0.2 mg/dL (ref 0.0–0.2)
Indirect Bilirubin: 0.6 mg/dL (ref 0.3–0.9)
Total Bilirubin: 0.8 mg/dL (ref 0.3–1.2)
Total Protein: 5.5 g/dL — ABNORMAL LOW (ref 6.5–8.1)

## 2022-06-26 LAB — SODIUM
Sodium: 120 mmol/L — ABNORMAL LOW (ref 135–145)
Sodium: 131 mmol/L — ABNORMAL LOW (ref 135–145)
Sodium: 131 mmol/L — ABNORMAL LOW (ref 135–145)
Sodium: 133 mmol/L — ABNORMAL LOW (ref 135–145)

## 2022-06-26 LAB — GLUCOSE, CAPILLARY
Glucose-Capillary: 115 mg/dL — ABNORMAL HIGH (ref 70–99)
Glucose-Capillary: 116 mg/dL — ABNORMAL HIGH (ref 70–99)
Glucose-Capillary: 98 mg/dL (ref 70–99)
Glucose-Capillary: 102 mg/dL — ABNORMAL HIGH (ref 70–99)

## 2022-06-26 LAB — PHOSPHORUS: Phosphorus: 3.3 mg/dL (ref 2.5–4.6)

## 2022-06-26 MED ORDER — NALTREXONE HCL 50 MG PO TABS
50.0000 mg | ORAL_TABLET | Freq: Every day | ORAL | Status: DC
Start: 2022-06-27 — End: 2022-06-27
  Administered 2022-06-27: 50 mg via ORAL
  Filled 2022-06-26: qty 1

## 2022-06-26 MED ORDER — SIMETHICONE 80 MG PO CHEW
80.0000 mg | CHEWABLE_TABLET | Freq: Four times a day (QID) | ORAL | Status: DC | PRN
  Administered 2022-06-26 – 2022-06-27 (×3): 80 mg via ORAL
  Filled 2022-06-26 (×5): qty 1

## 2022-06-26 MED ORDER — NALTREXONE HCL 50 MG PO TABS
25.0000 mg | ORAL_TABLET | Freq: Every day | ORAL | Status: DC
Start: 2022-06-26 — End: 2022-06-26
  Filled 2022-06-26: qty 1

## 2022-06-26 MED ORDER — NALTREXONE HCL 50 MG PO TABS
25.0000 mg | ORAL_TABLET | Freq: Once | ORAL | Status: AC
  Administered 2022-06-26: 25 mg via ORAL
  Filled 2022-06-26: qty 1

## 2022-06-26 NOTE — Progress Notes (Signed)
MEDICATION RELATED CONSULT NOTE - INITIAL   Pharmacy Consult for 3% NaCl Indication: Severe hyponatremia  No Known Allergies  Patient Measurements: Height: 6' (182.9 cm) Weight: 81.8 kg (180 lb 5.4 oz) IBW/kg (Calculated) : 77.6   Vital Signs: Temp: 97.3 F (36.3 C) (09/05 0757) Temp Source: Oral (09/05 0757) BP: 121/82 (09/05 0600) Pulse Rate: 92 (09/05 0700) Intake/Output from previous day: 09/04 0701 - 09/05 0700 In: 3222.9 [P.O.:1440; I.V.:1050.8; IV Piggyback:632.1] Out: 2750 [Urine:2750] Intake/Output from this shift: No intake/output data recorded.  Labs: Recent Labs    06/24/22 1507 06/24/22 1507 06/24/22 1741 06/24/22 1827 06/24/22 2112 06/25/22 0517 06/25/22 1305 06/26/22 0452  WBC 10.4  --  6.7  --   --  5.2  --  3.5*  HGB 15.6  --  14.8  --   --  13.9  --  12.2*  HCT 41.9  --  38.8*  --   --  37.9*  --  33.2*  PLT 261  --  214  --   --  217  --  211  APTT  --   --   --   --  26  --   --   --   CREATININE 0.97  --   --   --   --  0.88  --  0.70  MG  --   --   --    < >  --  2.1 1.9 1.7  PHOS  --    < > 1.7*  --   --  3.3 3.0 3.3  ALBUMIN 4.4  --   --   --   --  3.3*  --  3.1*  PROT 7.7  --   --   --   --  6.0*  --  5.5*  AST 106*  --   --   --   --  71*  --  151*  ALT 44  --   --   --   --  33  --  31  ALKPHOS 80  --   --   --   --  54  --  54  BILITOT 1.4*  --   --   --   --  1.0  --  0.8  BILIDIR  --   --   --   --   --  0.2  --  0.2  IBILI  --   --   --   --   --  0.8  --  0.6   < > = values in this interval not displayed.    Estimated Creatinine Clearance: 123.9 mL/min (by C-G formula based on SCr of 0.7 mg/dL).   Microbiology: Recent Results (from the past 720 hour(s))  SARS Coronavirus 2 by RT PCR (hospital order, performed in [facility] hospital lab) *cepheid single result test*     Status: None   Collection Time: 06/24/22  4:32 PM   Specimen: Nasal Swab  Result Value Ref Range Status   SARS Coronavirus 2 by RT PCR NEGATIVE NEGATIVE  Final    Comment: (NOTE) SARS-CoV-2 target nucleic acids are NOT DETECTED.  The SARS-CoV-2 RNA is generally detectable in upper and lower respiratory specimens during the acute phase of infection. The lowest concentration of SARS-CoV-2 viral copies this assay can detect is 250 copies / mL. A negative result does not preclude SARS-CoV-2 infection and should not be used as the sole basis for treatment or other patient management decisions.  A negative  result may occur with improper specimen collection / handling, submission of specimen other than nasopharyngeal swab, presence of viral mutation(s) within the areas targeted by this assay, and inadequate number of viral copies (<250 copies / mL). A negative result must be combined with clinical observations, patient history, and epidemiological information.  Fact Sheet for Patients:   RoadLapTop.co.za  Fact Sheet for Healthcare Providers: http://kim-miller.com/  This test is not yet approved or  cleared by the [place] FDA and has been authorized for detection and/or diagnosis of SARS-CoV-2 by FDA under an Emergency Use Authorization (EUA).  This EUA will remain in effect (meaning this test can be used) for the duration of the COVID-19 declaration under Section 564(b)(1) of the Act, 21 U.S.C. section 360bbb-3(b)(1), unless the authorization is terminated or revoked sooner.  Performed at [facility], [address] [place], [place] [address]   MRSA Next Gen by PCR, Nasal     Status: None   Collection Time: 06/24/22  6:07 PM   Specimen: Nasal Mucosa; Nasal Swab  Result Value Ref Range Status   MRSA by PCR Next Gen NOT DETECTED NOT DETECTED Final    Comment: (NOTE) The GeneXpert MRSA Assay (FDA approved for NASAL specimens only), is one component of a comprehensive MRSA colonization surveillance program. It is not intended to diagnose MRSA infection nor to guide or monitor  treatment for MRSA infections. Test performance is not FDA approved in patients less than 36 years old. Performed at [facility], [address] [place], [place] [address]     Medical History: History reviewed. No pertinent past medical history.  Assessment: Nicholas Fletcher is a 46 y.o. male with PMH significant for ETOH Abuse and Alcoholic Hepatitis who presented with dizziness, slurred speech, chest tightness, and cramping of the hands. Last reported alcoholic drink was 8/28 or 8/29. Patient reportedly drinks 750 mL of vodka over a 2 day period and drinks daily. Wile being triaged in the ED he has a witnessed tonic-clonic seizure. Na on admission was found to be 110, and hypertonic saline was initiated. Pharmacy has been consulted to help manage hypertonic saline infusion.  Baseline: 9/3 @ 1445 Na 110   Goal of Therapy:  No increase in Na > 4 mEq in 2 hrs No increase in Na > 6 mEq in 4 hrs Goal increase in Na ~ 8 mEq in 24 hrs  Monitoring: Date  Time Na Comment/Action  9/3 1445 110 Initiate 3% saline at 30 mL/hr  9/3 1507 115 Continue 3% saline at 30 mL/hr  9/3 1741 112 Continue 3% saline at 30 mL/hr  9/3 2112 124 Contacted NP, suggested may be due to lab error, NP agreed to do stat repeat of Na  9/3 2242 126 Previous reading valid, hold 3% NaCl, DDAVP given 9/3 @ 2328  9/4 0034 127 D5W 500 mL X 1 ordered  9/4 0246 126 Additional DDAVP 2 mcg IV X 1 ordered   9/4 0517 126 DDAVP 2 mcg given @ 0448, NP to order additional D5W 500 mL bolus   9/4 0705 125 Continue D5W@100  mL/hr  9/4 0839 126 Continue D5W@100  mL/hr  9/4 1016 128 DDAVP ordered x 2 doses  9/4 1305 122 Continue D5W@100  mL/hr, DDAVP 1 mcg x1 given @ 1316  9/4 1448 121 Continue D5W@100  mL/hr  9/4 1634 120 Continue D5W@100  mL/hr, 9/4 2035 120 Continue D5W@100  mL/hr  9/5 0452 124 D5W ON hold (off)  9/5 1140 131     Plan:  NaCL 3% Consult discontinued; remains off further treatment. Will CTM  peripherally Serial monitoring of Na Q4H remains active  Consult remains for electrolyte monitoring, however other lytes remain WNL at this time not requiring repletion.  Thank you for allowing pharmacy to be a part of this patient's care.  Lorna Dibble Clinical Pharmacist 06/26/2022 9:06 AM

## 2022-06-26 NOTE — Consult Note (Signed)
[facility] Face-to-Face Psychiatry Consult   Reason for Consult:  alcohol use disorder Referring Physician:  Dr Elvina Sidle Patient Identification: Nicholas Fletcher MRN:  [id number] Principal Diagnosis: Hyponatremia Diagnosis:  Principal Problem:   Hyponatremia Active Problems:   Alcohol use disorder   Acute metabolic encephalopathy   ETOH abuse   Total Time spent with patient: 45 minutes  Subjective:   Nicholas Fletcher is a 46 y.o. male patient admitted with hyponatremia, alcohol use disorder.  HPI:  46 yo male admitted for hyponatremia related to alcohol use disorder.  He has been drinking 1/2 pint of liquor daily since his 30s with no detox or rehabs.  Now, he desires to stop using alcohol.  Denies depression with no suicidal ideations, no past depression.  High anxiety, no panic attacks.  He lives with his wife and works in [employer] but has not worked since COVID related to his alcohol use.  Discussed rehab and he is agreeable to go once he is medically cleared.  Recommend [facility] in [place], [place] order will be placed for this purpose.  Discussed naltrexone to assist with cravings and client is agreeable.  Also discussed [facility] IOP for substance abuse once he is released from rehab, resources in discharge instructions.  Past Psychiatric History: alcohol use disorder  Risk to Self:  none Risk to Others:  none Prior Inpatient Therapy:  none Prior Outpatient Therapy:  none  Past Medical History: History reviewed. No pertinent past medical history.  Past Surgical History:  Procedure Laterality Date   COLONOSCOPY WITH PROPOFOL N/A 09/26/2021   Procedure: COLONOSCOPY WITH PROPOFOL;  Surgeon: Regis Bill, MD;  Location: [facility] ENDOSCOPY;  Service: Endoscopy;  Laterality: N/A;   COLONOSCOPY WITH PROPOFOL N/A 09/28/2021   Procedure: COLONOSCOPY WITH PROPOFOL;  Surgeon: Regis Bill, MD;  Location: [facility] ENDOSCOPY;  Service: Endoscopy;  Laterality: N/A;    ESOPHAGOGASTRODUODENOSCOPY (EGD) WITH PROPOFOL N/A 09/26/2021   Procedure: ESOPHAGOGASTRODUODENOSCOPY (EGD) WITH PROPOFOL;  Surgeon: Regis Bill, MD;  Location: [facility] ENDOSCOPY;  Service: Endoscopy;  Laterality: N/A;   TONSILLECTOMY     Family History:  Family History  Family history unknown: Yes   Family Psychiatric  History: none Social History:  Social History   Substance and Sexual Activity  Alcohol Use Yes     Social History   Substance and Sexual Activity  Drug Use Not Currently    Social History   Socioeconomic History   Marital status: Married    Spouse name: Not on file   Number of children: Not on file   Years of education: Not on file   Highest education level: Not on file  Occupational History   Not on file  Tobacco Use   Smoking status: Light Smoker    Types: Cigars   Smokeless tobacco: Never  Substance and Sexual Activity   Alcohol use: Yes   Drug use: Not Currently   Sexual activity: Not on file  Other Topics Concern   Not on file  Social History Narrative   Not on file   Social Determinants of Health   Financial Resource Strain: Not on file  Food Insecurity: Not on file  Transportation Needs: Not on file  Physical Activity: Not on file  Stress: Not on file  Social Connections: Not on file   Additional Social History:    Allergies:  No Known Allergies  Labs:  Results for orders placed or performed during the hospital encounter of 06/24/22 (from the past 48 hour(s))  CBG  monitoring, ED     Status: Abnormal   Collection Time: 06/24/22  2:45 PM  Result Value Ref Range   Glucose-Capillary 149 (H) 70 - 99 mg/dL    Comment: Glucose reference range applies only to samples taken after fasting for at least 8 hours.  Sodium     Status: Abnormal   Collection Time: 06/24/22  2:45 PM  Result Value Ref Range   Sodium 110 (LL) 135 - 145 mmol/L    Comment: CRITICAL RESULT CALLED TO, READ BACK BY AND VERIFIED WITH COURTENAY NESTOR ON 06/24/22 AT  1630 QSD Performed at [facility], [address] [place], [place] [address]   Osmolality     Status: Abnormal   Collection Time: 06/24/22  2:45 PM  Result Value Ref Range   Osmolality 232 (LL) 275 - 295 mOsm/kg    Comment: CRITICAL RESULT CALLED TO, READ BACK BY AND VERIFIED WITH: COURTNEY NESTOR 06/24/22 @ 1647 B SB Performed at [facility], [address] [place], [place] [address]   Magnesium     Status: Abnormal   Collection Time: 06/24/22  2:45 PM  Result Value Ref Range   Magnesium 1.3 (L) 1.7 - 2.4 mg/dL    Comment: Performed at [facility], [address] [place], [place] [address]  CBC     Status: Abnormal   Collection Time: 06/24/22  3:07 PM  Result Value Ref Range   WBC 10.4 4.0 - 10.5 K/uL   RBC 4.44 4.22 - 5.81 MIL/uL   Hemoglobin 15.6 13.0 - 17.0 g/dL   HCT 09.4 70.9 - 62.8 %   MCV 94.4 80.0 - 100.0 fL   MCH 35.1 (H) 26.0 - 34.0 pg   MCHC 37.2 (H) 30.0 - 36.0 g/dL   RDW 36.6 (L) 29.4 - 76.5 %   Platelets 261 150 - 400 K/uL   nRBC 0.0 0.0 - 0.2 %    Comment: Performed at [facility], [address] [place], [place] [address]  Comprehensive metabolic panel     Status: Abnormal   Collection Time: 06/24/22  3:07 PM  Result Value Ref Range   Sodium 115 (LL) 135 - 145 mmol/L    Comment: CRITICAL RESULT CALLED TO, READ BACK BY AND VERIFIED WITH COURTENAY NESTOR ON 06/24/22 AT 1533 QSD    Potassium 2.2 (LL) 3.5 - 5.1 mmol/L    Comment: CRITICAL RESULT CALLED TO, READ BACK BY AND VERIFIED WITH COURTENAY NESTOR ON 06/24/22 AT 1533 QSD    Chloride 77 (L) 98 - 111 mmol/L   CO2 20 (L) 22 - 32 mmol/L   Glucose, Bld 151 (H) 70 - 99 mg/dL    Comment: Glucose reference range applies only to samples taken after fasting for at least 8 hours.   BUN <5 (L) 6 - 20 mg/dL   Creatinine, Ser 5.46 0.61 - 1.24 mg/dL   Calcium 9.3 8.9 - 56.8 mg/dL   Total Protein 7.7 6.5 - 8.1 g/dL   Albumin 4.4 3.5 - 5.0 g/dL   AST 127 (H) 15 - 41 U/L    ALT 44 0 - 44 U/L   Alkaline Phosphatase 80 38 - 126 U/L   Total Bilirubin 1.4 (H) 0.3 - 1.2 mg/dL   GFR, Estimated >51 >70 mL/min    Comment: (NOTE) Calculated using the CKD-EPI Creatinine Equation (2021)    Anion gap 18 (H) 5 - 15    Comment: Performed at [facility], [address], [place], [place]  [address]  Ethanol     Status: None   Collection Time: 06/24/22  3:07 PM  Result Value Ref Range   Alcohol, Ethyl (B) <10 <10 mg/dL    Comment: (NOTE) Lowest detectable limit for serum alcohol is 10 mg/dL.  For medical purposes only. Performed at [facility], [address] [place], [place] [address]   Ammonia     Status: Abnormal   Collection Time: 06/24/22  3:07 PM  Result Value Ref Range   Ammonia 78 (H) 9 - 35 umol/L    Comment: Performed at [facility], [address] [place], [place] [address]  Urine Drug Screen, Qualitative ([facility] only)     Status: None   Collection Time: 06/24/22  4:32 PM  Result Value Ref Range   Tricyclic, Ur Screen NONE DETECTED NONE DETECTED   Amphetamines, Ur Screen NONE DETECTED NONE DETECTED   MDMA (Ecstasy)Ur Screen NONE DETECTED NONE DETECTED   Cocaine Metabolite,Ur [place] NONE DETECTED NONE DETECTED   Opiate, Ur Screen NONE DETECTED NONE DETECTED   Phencyclidine (PCP) Ur S NONE DETECTED NONE DETECTED   Cannabinoid 50 Ng, Ur [place] NONE DETECTED NONE DETECTED   Barbiturates, Ur Screen NONE DETECTED NONE DETECTED   Benzodiazepine, Ur Scrn NONE DETECTED NONE DETECTED   Methadone Scn, Ur NONE DETECTED NONE DETECTED    Comment: (NOTE) Tricyclics + metabolites, urine    Cutoff 1000 ng/mL Amphetamines + metabolites, urine  Cutoff 1000 ng/mL MDMA (Ecstasy), urine              Cutoff 500 ng/mL Cocaine Metabolite, urine          Cutoff 300 ng/mL Opiate + metabolites, urine        Cutoff 300 ng/mL Phencyclidine (PCP), urine         Cutoff 25 ng/mL Cannabinoid, urine                 Cutoff 50 ng/mL Barbiturates +  metabolites, urine  Cutoff 200 ng/mL Benzodiazepine, urine              Cutoff 200 ng/mL Methadone, urine                   Cutoff 300 ng/mL  The urine drug screen provides only a preliminary, unconfirmed analytical test result and should not be used for non-medical purposes. Clinical consideration and professional judgment should be applied to any positive drug screen result due to possible interfering substances. A more specific alternate chemical method must be used in order to obtain a confirmed analytical result. Gas chromatography / mass spectrometry (GC/MS) is the preferred confirm atory method. Performed at [facility], [address] [place], [place] [address]   Urinalysis, Complete w Microscopic     Status: Abnormal   Collection Time: 06/24/22  4:32 PM  Result Value Ref Range   Color, Urine YELLOW (A) YELLOW   APPearance CLEAR (A) CLEAR   Specific Gravity, Urine 1.003 (L) 1.005 - 1.030   pH 6.0 5.0 - 8.0   Glucose, UA NEGATIVE NEGATIVE mg/dL   Hgb urine dipstick SMALL (A) NEGATIVE   Bilirubin Urine NEGATIVE NEGATIVE   Ketones, ur NEGATIVE NEGATIVE mg/dL   Protein, ur NEGATIVE NEGATIVE mg/dL   Nitrite NEGATIVE NEGATIVE   Leukocytes,Ua NEGATIVE NEGATIVE   RBC / HPF 0-5 0 - 5 RBC/hpf   WBC, UA 0-5 0 - 5 WBC/hpf   Bacteria, UA NONE SEEN NONE SEEN   Squamous Epithelial / LPF NONE SEEN 0 -  5    Comment: Performed at [facility], [address] [place], [place] [address]  SARS Coronavirus 2 by RT PCR (hospital order, performed in [facility] hospital lab) *cepheid single result test*     Status: None   Collection Time: 06/24/22  4:32 PM   Specimen: Nasal Swab  Result Value Ref Range   SARS Coronavirus 2 by RT PCR NEGATIVE NEGATIVE    Comment: (NOTE) SARS-CoV-2 target nucleic acids are NOT DETECTED.  The SARS-CoV-2 RNA is generally detectable in upper and lower respiratory specimens during the acute phase of infection. The lowest concentration of  SARS-CoV-2 viral copies this assay can detect is 250 copies / mL. A negative result does not preclude SARS-CoV-2 infection and should not be used as the sole basis for treatment or other patient management decisions.  A negative result may occur with improper specimen collection / handling, submission of specimen other than nasopharyngeal swab, presence of viral mutation(s) within the areas targeted by this assay, and inadequate number of viral copies (<250 copies / mL). A negative result must be combined with clinical observations, patient history, and epidemiological information.  Fact Sheet for Patients:   RoadLapTop.co.za  Fact Sheet for Healthcare Providers: http://kim-miller.com/  This test is not yet approved or  cleared by the [place] FDA and has been authorized for detection and/or diagnosis of SARS-CoV-2 by FDA under an Emergency Use Authorization (EUA).  This EUA will remain in effect (meaning this test can be used) for the duration of the COVID-19 declaration under Section 564(b)(1) of the Act, 21 U.S.C. section 360bbb-3(b)(1), unless the authorization is terminated or revoked sooner.  Performed at [facility], [address] [place], [place] [address]   Osmolality, urine     Status: Abnormal   Collection Time: 06/24/22  4:32 PM  Result Value Ref Range   Osmolality, Ur 98 (L) 300 - 900 mOsm/kg    Comment: REPEATED TO VERIFY Performed at [facility], [address] [place], [place] [address]   Sodium, urine, random     Status: None   Collection Time: 06/24/22  4:32 PM  Result Value Ref Range   Sodium, Ur <10 mmol/L    Comment: Performed at [facility], [address] [place], [place] [address]  CBC     Status: Abnormal   Collection Time: 06/24/22  5:41 PM  Result Value Ref Range   WBC 6.7 4.0 - 10.5 K/uL   RBC 4.16 (L) 4.22 - 5.81 MIL/uL   Hemoglobin 14.8 13.0 - 17.0 g/dL    HCT 29.5 (L) 62.1 - 52.0 %   MCV 93.3 80.0 - 100.0 fL   MCH 35.6 (H) 26.0 - 34.0 pg   MCHC 38.1 (H) 30.0 - 36.0 g/dL   RDW 30.8 (L) 65.7 - 84.6 %   Platelets 214 150 - 400 K/uL   nRBC 0.0 0.0 - 0.2 %    Comment: Performed at [facility], [address] [place], [place] [address]  Phosphorus     Status: Abnormal   Collection Time: 06/24/22  5:41 PM  Result Value Ref Range   Phosphorus 1.7 (L) 2.5 - 4.6 mg/dL    Comment: Performed at [facility], [address] [place], [place] [address]  Sodium     Status: Abnormal   Collection Time: 06/24/22  5:41 PM  Result Value Ref Range   Sodium 112 (LL) 135 - 145 mmol/L    Comment: CRITICAL RESULT CALLED TO,  READ BACK BY AND VERIFIED WITH IVAN SCRIVNER AT 8182 06/24/2022 DLB Performed at [facility], [address] [place], [place] [address]   Glucose, capillary     Status: Abnormal   Collection Time: 06/24/22  5:58 PM  Result Value Ref Range   Glucose-Capillary 104 (H) 70 - 99 mg/dL    Comment: Glucose reference range applies only to samples taken after fasting for at least 8 hours.  MRSA Next Gen by PCR, Nasal     Status: None   Collection Time: 06/24/22  6:07 PM   Specimen: Nasal Mucosa; Nasal Swab  Result Value Ref Range   MRSA by PCR Next Gen NOT DETECTED NOT DETECTED    Comment: (NOTE) The GeneXpert MRSA Assay (FDA approved for NASAL specimens only), is one component of a comprehensive MRSA colonization surveillance program. It is not intended to diagnose MRSA infection nor to guide or monitor treatment for MRSA infections. Test performance is not FDA approved in patients less than 58 years old. Performed at [facility], [address] [place], [place] [address]   Troponin I (High Sensitivity)     Status: None   Collection Time: 06/24/22  6:27 PM  Result Value Ref Range   Troponin I (High Sensitivity) 4 <18 ng/L    Comment: (NOTE) Elevated high sensitivity troponin I (hsTnI) values  and significant  changes across serial measurements may suggest ACS but many other  chronic and acute conditions are known to elevate hsTnI results.  Refer to the "Links" section for chest pain algorithms and additional  guidance. Performed at [facility], [address] [place], [place] [address]   Magnesium     Status: None   Collection Time: 06/24/22  6:27 PM  Result Value Ref Range   Magnesium 2.0 1.7 - 2.4 mg/dL    Comment: Performed at [facility], [address] [place], [place] [address]  Potassium     Status: Abnormal   Collection Time: 06/24/22  6:27 PM  Result Value Ref Range   Potassium 2.7 (LL) 3.5 - 5.1 mmol/L    Comment: CRITICAL RESULT CALLED TO, READ BACK BY AND VERIFIED WITH IVAN SCRIVNER AT 2057 06/24/2022 DLB Performed at [facility], [address] [place], [place] [address]   Glucose, capillary     Status: Abnormal   Collection Time: 06/24/22  7:33 PM  Result Value Ref Range   Glucose-Capillary 118 (H) 70 - 99 mg/dL    Comment: Glucose reference range applies only to samples taken after fasting for at least 8 hours.  Protime-INR     Status: None   Collection Time: 06/24/22  9:12 PM  Result Value Ref Range   Prothrombin Time 12.4 11.4 - 15.2 seconds   INR 0.9 0.8 - 1.2    Comment: (NOTE) INR goal varies based on device and disease states. Performed at [facility], [address] [place], [place] [address]   APTT     Status: None   Collection Time: 06/24/22  9:12 PM  Result Value Ref Range   aPTT 26 24 - 36 seconds    Comment: Performed at [facility], [address] [place], [place] [address]  Sodium     Status: Abnormal   Collection Time: 06/24/22  9:12 PM  Result Value Ref Range   Sodium 124 (L) 135 - 145 mmol/L    Comment: RESULTS VERIFIED BY REPEAT TESTING DLB Performed at [facility], [address]  [place], [place] [address]   Troponin I (High Sensitivity)     Status: None    Collection Time: 06/24/22  9:12 PM  Result Value Ref Range   Troponin I (High Sensitivity) 6 <18 ng/L    Comment: (NOTE) Elevated high sensitivity troponin I (hsTnI) values and significant  changes across serial measurements may suggest ACS but many other  chronic and acute conditions are known to elevate hsTnI results.  Refer to the "Links" section for chest pain algorithms and additional  guidance. Performed at [facility], [address] [place], [place] [address]   Potassium     Status: Abnormal   Collection Time: 06/24/22  9:12 PM  Result Value Ref Range   Potassium 2.4 (LL) 3.5 - 5.1 mmol/L    Comment: CRITICAL RESULT CALLED TO, READ BACK BY AND VERIFIED WITH IVAN SCRIVNER AT 2215 06/24/2022 DLB Performed at [facility], [address], [place], [place] [address]   Sodium     Status: Abnormal   Collection Time: 06/24/22 10:42 PM  Result Value Ref Range   Sodium 126 (L) 135 - 145 mmol/L    Comment: RESULTS CHECKED DLB Performed at [facility], [address] [place], [place] [address]   Glucose, capillary     Status: Abnormal   Collection Time: 06/24/22 11:53 PM  Result Value Ref Range   Glucose-Capillary 123 (H) 70 - 99 mg/dL    Comment: Glucose reference range applies only to samples taken after fasting for at least 8 hours.  Sodium     Status: Abnormal   Collection Time: 06/25/22 12:34 AM  Result Value Ref Range   Sodium 127 (L) 135 - 145 mmol/L    Comment: Performed at [facility], [address] [place], [place] [address]  Sodium     Status: Abnormal   Collection Time: 06/25/22  2:46 AM  Result Value Ref Range   Sodium 126 (L) 135 - 145 mmol/L    Comment: Performed at [facility], [address] [place], [place] [address]  Potassium     Status: Abnormal   Collection Time: 06/25/22  2:46 AM  Result Value Ref Range   Potassium 3.3 (L) 3.5 - 5.1 mmol/L    Comment: Performed at [facility], [address] [place], [place] [address]  Glucose, capillary     Status: Abnormal   Collection Time: 06/25/22  3:17 AM  Result Value Ref Range   Glucose-Capillary 138 (H) 70 - 99 mg/dL    Comment: Glucose reference range applies only to samples taken after fasting for at least 8 hours.  CBC     Status: Abnormal   Collection Time: 06/25/22  5:17 AM  Result Value Ref Range   WBC 5.2 4.0 - 10.5 K/uL   RBC 3.91 (L) 4.22 - 5.81 MIL/uL   Hemoglobin 13.9 13.0 - 17.0 g/dL   HCT 03.4 (L) 74.2 - 59.5 %   MCV 96.9 80.0 - 100.0 fL   MCH 35.5 (H) 26.0 - 34.0 pg   MCHC 36.7 (H) 30.0 - 36.0 g/dL    Comment: CORRECTED FOR INTERFERING SUBSTANCE   RDW 10.9 (L) 11.5 - 15.5 %   Platelets 217 150 - 400 K/uL   nRBC 0.0 0.0 - 0.2 %    Comment: Performed at [facility], [address], [place], [place] [address]  Basic metabolic panel     Status: Abnormal   Collection Time: 06/25/22  5:17 AM  Result  Value Ref Range   Sodium 126 (L) 135 - 145 mmol/L   Potassium 3.2 (L) 3.5 - 5.1 mmol/L   Chloride 90 (L) 98 - 111 mmol/L   CO2 28 22 - 32 mmol/L   Glucose, Bld 112 (H) 70 - 99 mg/dL    Comment: Glucose reference range applies only to samples taken after fasting for at least 8 hours.   BUN <5 (L) 6 - 20 mg/dL   Creatinine, Ser 4.13 0.61 - 1.24 mg/dL   Calcium 8.3 (L) 8.9 - 10.3 mg/dL   GFR, Estimated >24 >40 mL/min    Comment: (NOTE) Calculated using the CKD-EPI Creatinine Equation (2021)    Anion gap 8 5 - 15    Comment: Performed at [facility], [address], [place], [place] [address]  Magnesium     Status: None   Collection Time: 06/25/22  5:17 AM  Result Value Ref Range   Magnesium 2.1 1.7 - 2.4 mg/dL    Comment: Performed at [facility], [address] [place], [place] [address]  Phosphorus     Status: None   Collection Time: 06/25/22  5:17 AM  Result Value Ref Range   Phosphorus 3.3 2.5 - 4.6 mg/dL    Comment: Performed at [facility], [address] [place], [place] [address]  Hepatic function panel     Status: Abnormal   Collection Time: 06/25/22  5:17 AM  Result Value Ref Range   Total Protein 6.0 (L) 6.5 - 8.1 g/dL   Albumin 3.3 (L) 3.5 - 5.0 g/dL   AST 71 (H) 15 - 41 U/L   ALT 33 0 - 44 U/L   Alkaline Phosphatase 54 38 - 126 U/L   Total Bilirubin 1.0 0.3 - 1.2 mg/dL   Bilirubin, Direct 0.2 0.0 - 0.2 mg/dL   Indirect Bilirubin 0.8 0.3 - 0.9 mg/dL    Comment: Performed at [facility], [address] [place], [place] [address]  Ammonia     Status: None   Collection Time: 06/25/22  5:17 AM  Result Value Ref Range   Ammonia 11 9 - 35 umol/L    Comment: Performed at [facility], [address] [place], [place] [address]  Sodium     Status: Abnormal   Collection Time: 06/25/22  7:05 AM  Result Value Ref Range   Sodium 125 (L) 135 - 145 mmol/L    Comment: Performed at [facility], [address] [place], [place] [address]  Glucose, capillary     Status: Abnormal   Collection Time: 06/25/22  7:23 AM  Result Value Ref Range   Glucose-Capillary 151 (H) 70 - 99 mg/dL    Comment: Glucose reference range applies only to samples taken after fasting for at least 8 hours.  Sodium     Status: Abnormal   Collection Time: 06/25/22  8:39 AM  Result Value Ref Range   Sodium 126 (L) 135 - 145 mmol/L    Comment: Performed at [facility], [address] [place], [place] [address]  Sodium     Status: Abnormal   Collection Time: 06/25/22 10:16 AM  Result Value Ref Range   Sodium 128 (L) 135 - 145 mmol/L    Comment: Performed at [facility], [address] [place], [place] [address]  Glucose, capillary     Status: Abnormal   Collection Time: 06/25/22 11:28 AM  Result Value Ref Range   Glucose-Capillary 120 (H) 70 -  99 mg/dL    Comment: Glucose reference range applies only to samples taken after fasting for at least 8 hours.  Sodium     Status: Abnormal   Collection Time:  06/25/22  1:05 PM  Result Value Ref Range   Sodium 122 (L) 135 - 145 mmol/L    Comment: Performed at [facility], [address] [place], [place] [address]  Potassium     Status: None   Collection Time: 06/25/22  1:05 PM  Result Value Ref Range   Potassium 3.5 3.5 - 5.1 mmol/L    Comment: Performed at [facility], [address] [place], [place] [address]  Magnesium     Status: None   Collection Time: 06/25/22  1:05 PM  Result Value Ref Range   Magnesium 1.9 1.7 - 2.4 mg/dL    Comment: Performed at [facility], [address] [place], [place] [address]  Phosphorus     Status: None   Collection Time: 06/25/22  1:05 PM  Result Value Ref Range   Phosphorus 3.0 2.5 - 4.6 mg/dL    Comment: Performed at [facility], [address] [place], [place] [address]  Sodium     Status: Abnormal   Collection Time: 06/25/22  2:48 PM  Result Value Ref Range   Sodium 121 (L) 135 - 145 mmol/L    Comment: Performed at [facility], [address] [place], [place] [address]  Sodium     Status: Abnormal   Collection Time: 06/25/22  4:34 PM  Result Value Ref Range   Sodium 120 (L) 135 - 145 mmol/L    Comment: Performed at [facility], [address] [place], [place] [address]  Glucose, capillary     Status: Abnormal   Collection Time: 06/25/22  7:30 PM  Result Value Ref Range   Glucose-Capillary 128 (H) 70 - 99 mg/dL    Comment: Glucose reference range applies only to samples taken after fasting for at least 8 hours.  Sodium     Status: Abnormal   Collection Time: 06/25/22  8:16 PM  Result Value Ref Range   Sodium 120 (L) 135 - 145 mmol/L    Comment: Performed at [facility], [address] [place], [place] [address]  Glucose, capillary     Status: Abnormal   Collection Time: 06/25/22 11:16 PM  Result Value Ref Range   Glucose-Capillary 108 (H) 70 - 99 mg/dL    Comment: Glucose reference range applies only to  samples taken after fasting for at least 8 hours.  Sodium     Status: Abnormal   Collection Time: 06/25/22 11:39 PM  Result Value Ref Range   Sodium 120 (L) 135 - 145 mmol/L    Comment: Performed at [facility], [address] [place], [place] [id number]  Glucose, capillary     Status: Abnormal   Collection Time: 06/26/22  3:17 AM  Result Value Ref Range   Glucose-Capillary 115 (H) 70 - 99 mg/dL    Comment: Glucose reference range applies only to samples taken after fasting for at least 8 hours.  Basic metabolic panel     Status: Abnormal   Collection Time: 06/26/22  4:52 AM  Result Value Ref Range   Sodium 124 (L) 135 - 145 mmol/L   Potassium 3.7 3.5 - 5.1 mmol/L   Chloride 92 (L) 98 - 111 mmol/L   CO2 27 22 - 32 mmol/L   Glucose, Bld 101 (H) 70 -  99 mg/dL    Comment: Glucose reference range applies only to samples taken after fasting for at least 8 hours.   BUN <5 (L) 6 - 20 mg/dL   Creatinine, Ser 1.61 0.61 - 1.24 mg/dL   Calcium 8.5 (L) 8.9 - 10.3 mg/dL   GFR, Estimated >09 >60 mL/min    Comment: (NOTE) Calculated using the CKD-EPI Creatinine Equation (2021)    Anion gap 5 5 - 15    Comment: Performed at [facility], [address] [place], [place] [address]  CBC     Status: Abnormal   Collection Time: 06/26/22  4:52 AM  Result Value Ref Range   WBC 3.5 (L) 4.0 - 10.5 K/uL   RBC 3.46 (L) 4.22 - 5.81 MIL/uL   Hemoglobin 12.2 (L) 13.0 - 17.0 g/dL   HCT 81.1 (L) 91.4 - 78.2 %   MCV 96.0 80.0 - 100.0 fL   MCH 35.3 (H) 26.0 - 34.0 pg   MCHC 36.7 (H) 30.0 - 36.0 g/dL   RDW 95.6 (L) 21.3 - 08.6 %   Platelets 211 150 - 400 K/uL   nRBC 0.0 0.0 - 0.2 %    Comment: Performed at [facility], [address], [place], [place] [address]  Hepatic function panel     Status: Abnormal   Collection Time: 06/26/22  4:52 AM  Result Value Ref Range   Total Protein 5.5 (L) 6.5 - 8.1 g/dL   Albumin 3.1 (L) 3.5 - 5.0 g/dL   AST 962 (H) 15 - 41 U/L   ALT 31  0 - 44 U/L   Alkaline Phosphatase 54 38 - 126 U/L   Total Bilirubin 0.8 0.3 - 1.2 mg/dL   Bilirubin, Direct 0.2 0.0 - 0.2 mg/dL   Indirect Bilirubin 0.6 0.3 - 0.9 mg/dL    Comment: Performed at [facility], [address] [place], [place] [address]  Magnesium     Status: None   Collection Time: 06/26/22  4:52 AM  Result Value Ref Range   Magnesium 1.7 1.7 - 2.4 mg/dL    Comment: Performed at [facility], [address] [place], [place] [address]  Phosphorus     Status: None   Collection Time: 06/26/22  4:52 AM  Result Value Ref Range   Phosphorus 3.3 2.5 - 4.6 mg/dL    Comment: Performed at [facility], [address] [place], [place] [address]  Glucose, capillary     Status: None   Collection Time: 06/26/22  7:37 AM  Result Value Ref Range   Glucose-Capillary 98 70 - 99 mg/dL    Comment: Glucose reference range applies only to samples taken after fasting for at least 8 hours.  Glucose, capillary     Status: Abnormal   Collection Time: 06/26/22  8:00 AM  Result Value Ref Range   Glucose-Capillary 116 (H) 70 - 99 mg/dL    Comment: Glucose reference range applies only to samples taken after fasting for at least 8 hours.    Current Facility-Administered Medications  Medication Dose Route Frequency Provider Last Rate Last Admin   0.9 %  sodium chloride infusion   Intravenous Continuous Judithe Modest, NP   Paused at 06/26/22 0947   acetaminophen (TYLENOL) tablet 650 mg  650 mg Oral Q6H PRN Judithe Modest, NP   650 mg at 06/25/22 1140   Chlorhexidine Gluconate Cloth 2 % PADS 6 each  6 each Topical Q0600 Salena Saner, MD  6 each at 06/26/22 0756   chlorproMAZINE (THORAZINE) tablet 25 mg  25 mg Oral TID Judithe Modest, NP   25 mg at 06/26/22 0755   docusate sodium (COLACE) capsule 100 mg  100 mg Oral BID PRN Ezequiel Essex, NP       enoxaparin (LOVENOX) injection 40 mg  40 mg Subcutaneous Q24H Ezequiel Essex, NP   40 mg at 06/25/22 2239    folic acid (FOLVITE) tablet 1 mg  1 mg Oral Daily Ezequiel Essex, NP   1 mg at 06/26/22 0949   LORazepam (ATIVAN) tablet 1-4 mg  1-4 mg Oral Q1H PRN Ezequiel Essex, NP   1 mg at 06/25/22 1144   Or   LORazepam (ATIVAN) injection 1-4 mg  1-4 mg Intravenous Q1H PRN Ezequiel Essex, NP       multivitamin with minerals tablet 1 tablet  1 tablet Oral Daily Ezequiel Essex, NP   1 tablet at 06/26/22 0949   pantoprazole (PROTONIX) injection 40 mg  40 mg Intravenous Q12H Ezequiel Essex, NP   40 mg at 06/26/22 0948   polyethylene glycol (MIRALAX / GLYCOLAX) packet 17 g  17 g Oral Daily PRN Ezequiel Essex, NP       sodium chloride flush (NS) 0.9 % injection 10-40 mL  10-40 mL Intracatheter Q12H Salena Saner, MD   10 mL at 06/26/22 0949   sodium chloride flush (NS) 0.9 % injection 10-40 mL  10-40 mL Intracatheter PRN Salena Saner, MD       thiamine (VITAMIN B1) 500 mg in normal saline (50 mL) IVPB  500 mg Intravenous Daily Ezequiel Essex, NP 100 mL/hr at 06/26/22 1000 Infusion Verify at 06/26/22 1000   Followed by   Melene Muller ON 06/28/2022] thiamine (VITAMIN B1) injection 100 mg  100 mg Intravenous Q24H Ezequiel Essex, NP       traMADol Janean Sark) tablet 50 mg  50 mg Oral Q6H PRN Judithe Modest, NP   50 mg at 06/25/22 1140    Musculoskeletal: Strength & Muscle Tone: decreased Gait & Station:  did not witenss Patient leans: N/A  Psychiatric Specialty Exam: Physical Exam Vitals and nursing note reviewed.  Constitutional:      Appearance: Normal appearance.  HENT:     Head: Normocephalic.     Nose: Nose normal.  Pulmonary:     Effort: Pulmonary effort is normal.  Musculoskeletal:     Cervical back: Normal range of motion.  Neurological:     General: No focal deficit present.     Mental Status: He is alert and oriented to person, place, and time.  Psychiatric:        Attention and Perception: Attention and perception normal.        Mood and Affect: Mood is anxious.        Speech:  Speech normal.        Behavior: Behavior normal. Behavior is cooperative.        Thought Content: Thought content normal.        Cognition and Memory: Cognition and memory normal.        Judgment: Judgment normal.     Review of Systems  Psychiatric/Behavioral:  Positive for substance abuse. Negative for agitation, behavioral problems, confusion, decreased concentration, dysphoric mood, hallucinations, self-injury, sleep disturbance and suicidal ideas. The patient is not nervous/anxious and is not hyperactive.   All other systems reviewed and are negative.   Blood pressure 130/84,  pulse 91, temperature (!) 97.3 F (36.3 C), temperature source Oral, resp. rate 19, height 6' (1.829 m), weight 81.8 kg, SpO2 100 %.Body mass index is 24.46 kg/m.  General Appearance: Neat  Eye Contact:  Good  Speech:  Clear and Coherent  Volume:  Normal  Mood:  Euthymic  Affect:  Appropriate  Thought Process:  Coherent and Goal Directed  Orientation:  Full (Time, Place, and Person)  Thought Content:  WDL and Logical  Suicidal Thoughts:  No  Homicidal Thoughts:  No  Memory:  Immediate;   Good Recent;   Good Remote;   Good  Judgement:  Intact  Insight:  Fair  Psychomotor Activity:  Normal  Concentration:  Concentration: Good and Attention Span: Good  Recall:  Fair  Fund of Knowledge:  Good  Language:  Good  Akathisia:  No  Handed:  Right  AIMS (if indicated):     Assets:  Desire for Improvement Intimacy Social Support  ADL's:  Intact  Cognition:  WNL  Sleep:        Physical Exam: Physical Exam Vitals and nursing note reviewed.  Constitutional:      Appearance: Normal appearance.  HENT:     Head: Normocephalic.     Nose: Nose normal.  Pulmonary:     Effort: Pulmonary effort is normal.  Musculoskeletal:     Cervical back: Normal range of motion.  Neurological:     General: No focal deficit present.     Mental Status: He is alert and oriented to person, place, and time.  Psychiatric:         Attention and Perception: Attention and perception normal.        Mood and Affect: Mood is anxious.        Speech: Speech normal.        Behavior: Behavior normal. Behavior is cooperative.        Thought Content: Thought content normal.        Cognition and Memory: Cognition and memory normal.        Judgment: Judgment normal.    Review of Systems  Psychiatric/Behavioral:  Positive for substance abuse. Negative for agitation, behavioral problems, confusion, decreased concentration, dysphoric mood, hallucinations, self-injury, sleep disturbance and suicidal ideas. The patient is not nervous/anxious and is not hyperactive.   All other systems reviewed and are negative.  Blood pressure 130/84, pulse 91, temperature (!) 97.3 F (36.3 C), temperature source Oral, resp. rate 19, height 6' (1.829 m), weight 81.8 kg, SpO2 100 %. Body mass index is 24.46 kg/m.  Treatment Plan Summary: Alcohol use disorder; continue Ativan detox protocol Start Naltrexone 25mg  today with increase to Naltrexone 50mg  once per day tomorrow. TOC consult for rehab with [facility] afterwards, instructions in discharge instructions  Disposition: No evidence of imminent risk to self or others at present.   Patient does not meet criteria for psychiatric inpatient admission.  , NP 06/26/2022 11:20 AM

## 2022-06-26 NOTE — TOC Initial Note (Signed)
Transition of Care [facility]) - Initial/Assessment Note    Patient Details  Name: Nicholas Fletcher MRN: [id number] Date of Birth: [birth year]  [facility] CM/SW Contact:    Shelbie Hutching, RN Phone Number: 06/26/2022, 2:05 PM  Clinical Narrative:                 Patient admitted to the hospital with hyponatremia, history of alcoholic hepatitis.  Patient endorses daily alcohol use.  RNCM met with patient and his wife, Verdene Lennert at the bedside.  Patient is from home with wife and their 72 year old child.  Patient is independent and works in [employer].  He does not currently have any insurance and no PCP.  RNCM provided information on [facility] and gave him the application.  Also provided patient with [place] [facility] [place].  They can get discharge prescriptions from [facility].  Patient reports that he has thought about cutting back on alcohol in the past, provided him with resources for Substance abuse in [place].  Patient and wife accepted all resources, wife will start working on the [facility] Application.    Expected Discharge Plan: Home/Self Care Barriers to Discharge: Continued Medical Work up   Patient Goals and CMS Choice Patient states their goals for this hospitalization and ongoing recovery are:: to get better and get home      Expected Discharge Plan and Services Expected Discharge Plan: Home/Self Care   Discharge Planning Services: CM Consult   Living arrangements for the past 2 months: Single Family Home                 DME Arranged: N/A DME Agency: NA       HH Arranged: NA HH Agency: NA        Prior Living Arrangements/Services Living arrangements for the past 2 months: Single Family Home Lives with:: Spouse, Minor Children Patient language and need for interpreter reviewed:: Yes Do you feel safe going back to the place where you live?: Yes      Need for Family Participation in Patient  Care: Yes (Comment) Care giver support system in place?: Yes (comment)   Criminal Activity/Legal Involvement Pertinent to Current Situation/Hospitalization: No - Comment as needed  Activities of Daily Living      Permission Sought/Granted Permission sought to share information with : Case Manager, Family Supports Permission granted to share information with : Yes, Verbal Permission Granted  Share Information with NAME: Wilkin Lippy     Permission granted to share info w Relationship: spouse  Permission granted to share info w Contact Information: [phone]  Emotional Assessment Appearance:: Appears stated age Attitude/Demeanor/Rapport: Engaged Affect (typically observed): Accepting Orientation: : Oriented to Self, Oriented to Place, Oriented to  Time, Oriented to Situation Alcohol / Substance Use: Alcohol Use Psych Involvement: Yes (comment)  Admission diagnosis:  Hypokalemia [E87.6] Hypomagnesemia [E83.42] Hyponatremia [E87.1] Seizures ([place]) [R56.9] Patient Active Problem List   Diagnosis Date Noted   Alcohol use disorder 06/26/2022   Hyponatremia 70/92/9574   Acute metabolic encephalopathy 73/40/3709   ETOH abuse 06/24/2022   Hepatitis 09/24/2021   PCP:  Patient, No Pcp Per Pharmacy:   [id number] [place], [place] [address] Phone: [phone] Fax: [phone]     Social Determinants of Health (SDOH) Interventions    Readmission Risk Interventions     No data to display

## 2022-06-26 NOTE — Discharge Instructions (Addendum)
[facility]  [place] [place] cell number is [phone]  Address: [address], [place], [place] [address] [facility] - Friday 9-4 Phone: [phone]

## 2022-06-26 NOTE — Progress Notes (Signed)
PROGRESS NOTE    Nicholas Fletcher  ZOX:[id number] DOB: May 04, 1976 DOA: 06/24/2022 PCP: Patient, No Pcp Per    Brief Narrative:  This 46 years old male with history of EtOH abuse and alcoholic hepatitis presented in the ED with c/o: dizziness, slurred speech and chest tightness and cramping of the hands.  Patient's wife reports the slurred speech started today in the afternoon and patient has not been acting like himself.  Patient drinks regularly.  He also endorses he started vaping 3 to 4 months ago.  Patient has  witnessed tonic-clonic seizures in the ER . He was confused afterwards. Patient has received 2 mg of IV Ativan.  Wife denies any prior history of seizures.  CT head negative for acute abnormality.  Lab results showed sodium 110,  potassium 2.2, anion gap 18.  urine drug screen negative.  Patient has received IV fluid bolus however he remains hyponatremic with repeat sodium 115 requiring hypertonic saline infusion.  Patient was admitted in the ICU.  He has also received 2 units of DD VAP.  Nephrology is consulted.  Serum sodium has improved.  TRH pickup 06/26/2022  Assessment & Plan:   Principal Problem:   Hyponatremia Active Problems:   Alcohol use disorder   Acute metabolic encephalopathy   ETOH abuse  Acute hypovolemic hyponatremia secondary to EtOH abuse: Patient presented with serum sodium 110, altered mentation,  slurred speech and cramping of the hands.   Suspect hyponatremia due to volume depletion in the setting of EtOH. Serum sodium did not improve with IV fluids.  Sodium 110> 115 Patient initiated on hypertonic saline .3 %  and admitted in ICU. Hypertonic saline discontinued with a concern of overcorrection. from 112> 124 concern for lab error. Patient has received 2 units of DDAVP. Nephrology is consulted.  Serum sodium has improved.  Acute metabolic Encephalopathy: Seizure activity secondary to acute hyponatremia or EtOH withdrawal: Hyponatremia corrected.  Continue  seizure precautions,  continue as needed Ativan for seizure activity.   EEG completed no evidence of seizures noted.   Continue thiamine continue folic acid. Continue Ativan for CIWA protocol.   EtOH and vape cessation counseled.  Elevated liver enzymes secondary to chronic alcohol use: Continue to monitor serum Liver enzymes.  Hypokalemia: Replaced.  Continue to monitor  Hypomagnesemia Replaced and resolved.  Hypophosphatemia: Replaced .Continue to monitor  Prolonged QTC : Resolved, Avoid QT prolongation medications.  DVT prophylaxis: Lovenox. Code Status: Full code Family Communication: Family at bed side. Disposition Plan:  Status is: Inpatient Remains inpatient appropriate because: Admitted for hyponatremia requiring hypertonic saline admitted in ICU now TRH pickup 06/26/2022.  Serum sodium has improved.    Consultants:  Nephrology  Procedures: None Antimicrobials: None Subjective: Was seen and examined at bedside.  Overnight events noted.  Patient reports feeling much improved and asked when he can be discharged.  Serum sodium is improved.  Objective: Vitals:   06/26/22 1143 06/26/22 1200 06/26/22 1300 06/26/22 1400  BP:  (!) 131/93  (!) 136/94  Pulse:  (!) 102 94 93  Resp:  15 17   Temp: 99.4 F (37.4 C)     TempSrc: Oral     SpO2:  98% 100%   Weight:      Height:        Intake/Output Summary (Last 24 hours) at 06/26/2022 1459 Last data filed at 06/26/2022 1400 Gross per 24 hour  Intake 1413.06 ml  Output 6150 ml  Net -4736.94 ml   Filed Weights   06/24/22  1449 06/24/22 1759 06/25/22 0600  Weight: 79.4 kg 80.4 kg 81.8 kg    Examination:  General exam: Appears comfortable, not in any acute distress. Respiratory system: CTA bilaterally, no accessory muscle use, respiratory effort normal, RR 15. Cardiovascular system: S1 & S2 heard, regular rate and rhythm, no murmur. Gastrointestinal system: Abdomen is soft, nontender, nondistended, BS+ Central  nervous system: Alert and oriented x3 . No focal neurological deficits. Extremities: No edema, no cyanosis, no clubbing Skin: No rashes, lesions or ulcers Psychiatry: Judgement and insight appear normal. Mood & affect appropriate.     Data Reviewed: I have personally reviewed following labs and imaging studies  CBC: Recent Labs  Lab 06/24/22 1507 06/24/22 1741 06/25/22 0517 06/26/22 0452  WBC 10.4 6.7 5.2 3.5*  HGB 15.6 14.8 13.9 12.2*  HCT 41.9 38.8* 37.9* 33.2*  MCV 94.4 93.3 96.9 96.0  PLT 261 214 217 211   Basic Metabolic Panel: Recent Labs  Lab 06/24/22 1445 06/24/22 1445 06/24/22 1507 06/24/22 1741 06/24/22 1827 06/24/22 2112 06/24/22 2242 06/25/22 0246 06/25/22 0517 06/25/22 0705 06/25/22 1305 06/25/22 1448 06/25/22 1634 06/25/22 2016 06/25/22 2339 06/26/22 0452 06/26/22 1140  NA 110*  --  115* 112*  --  124*   < > 126* 126*   < > 122*   < > 120* 120* 120* 124* 131*  K  --    < > 2.2*  --  2.7* 2.4*  --  3.3* 3.2*  --  3.5  --   --   --   --  3.7  --   CL  --   --  77*  --   --   --   --   --  90*  --   --   --   --   --   --  92*  --   CO2  --   --  20*  --   --   --   --   --  28  --   --   --   --   --   --  27  --   GLUCOSE  --   --  151*  --   --   --   --   --  112*  --   --   --   --   --   --  101*  --   BUN  --   --  <5*  --   --   --   --   --  <5*  --   --   --   --   --   --  <5*  --   CREATININE  --   --  0.97  --   --   --   --   --  0.88  --   --   --   --   --   --  0.70  --   CALCIUM  --   --  9.3  --   --   --   --   --  8.3*  --   --   --   --   --   --  8.5*  --   MG 1.3*  --   --   --  2.0  --   --   --  2.1  --  1.9  --   --   --   --  1.7  --   PHOS  --   --   --  1.7*  --   --   --   --  3.3  --  3.0  --   --   --   --  3.3  --    < > = values in this interval not displayed.   GFR: Estimated Creatinine Clearance: 123.9 mL/min (by C-G formula based on SCr of 0.7 mg/dL). Liver Function Tests: Recent Labs  Lab 06/24/22 1507  06/25/22 0517 06/26/22 0452  AST 106* 71* 151*  ALT 44 33 31  ALKPHOS 80 54 54  BILITOT 1.4* 1.0 0.8  PROT 7.7 6.0* 5.5*  ALBUMIN 4.4 3.3* 3.1*   No results for input(s): "LIPASE", "AMYLASE" in the last 168 hours. Recent Labs  Lab 06/24/22 1507 06/25/22 0517  AMMONIA 78* 11   Coagulation Profile: Recent Labs  Lab 06/24/22 2112  INR 0.9   Cardiac Enzymes: No results for input(s): "CKTOTAL", "CKMB", "CKMBINDEX", "TROPONINI" in the last 168 hours. BNP (last 3 results) No results for input(s): "PROBNP" in the last 8760 hours. HbA1C: No results for input(s): "HGBA1C" in the last 72 hours. CBG: Recent Labs  Lab 06/25/22 2316 06/26/22 0317 06/26/22 0737 06/26/22 0800 06/26/22 1140  GLUCAP 108* 115* 98 116* 102*   Lipid Profile: No results for input(s): "CHOL", "HDL", "LDLCALC", "TRIG", "CHOLHDL", "LDLDIRECT" in the last 72 hours. Thyroid Function Tests: No results for input(s): "TSH", "T4TOTAL", "FREET4", "T3FREE", "THYROIDAB" in the last 72 hours. Anemia Panel: No results for input(s): "VITAMINB12", "FOLATE", "FERRITIN", "TIBC", "IRON", "RETICCTPCT" in the last 72 hours. Sepsis Labs: No results for input(s): "PROCALCITON", "LATICACIDVEN" in the last 168 hours.  Recent Results (from the past 240 hour(s))  SARS Coronavirus 2 by RT PCR (hospital order, performed in [facility] hospital lab) *cepheid single result test*     Status: None   Collection Time: 06/24/22  4:32 PM   Specimen: Nasal Swab  Result Value Ref Range Status   SARS Coronavirus 2 by RT PCR NEGATIVE NEGATIVE Final    Comment: (NOTE) SARS-CoV-2 target nucleic acids are NOT DETECTED.  The SARS-CoV-2 RNA is generally detectable in upper and lower respiratory specimens during the acute phase of infection. The lowest concentration of SARS-CoV-2 viral copies this assay can detect is 250 copies / mL. A negative result does not preclude SARS-CoV-2 infection and should not be used as the sole basis for  treatment or other patient management decisions.  A negative result may occur with improper specimen collection / handling, submission of specimen other than nasopharyngeal swab, presence of viral mutation(s) within the areas targeted by this assay, and inadequate number of viral copies (<250 copies / mL). A negative result must be combined with clinical observations, patient history, and epidemiological information.  Fact Sheet for Patients:   RoadLapTop.co.za  Fact Sheet for Healthcare Providers: http://kim-miller.com/  This test is not yet approved or  cleared by the [place] FDA and has been authorized for detection and/or diagnosis of SARS-CoV-2 by FDA under an Emergency Use Authorization (EUA).  This EUA will remain in effect (meaning this test can be used) for the duration of the COVID-19 declaration under Section 564(b)(1) of the Act, 21 U.S.C. section 360bbb-3(b)(1), unless the authorization is terminated or revoked sooner.  Performed at [facility], [address] [place], [place] [address]   MRSA Next Gen by PCR, Nasal     Status: None   Collection Time: 06/24/22  6:07 PM   Specimen: Nasal Mucosa; Nasal Swab  Result Value Ref Range Status   MRSA  by PCR Next Gen NOT DETECTED NOT DETECTED Final    Comment: (NOTE) The GeneXpert MRSA Assay (FDA approved for NASAL specimens only), is one component of a comprehensive MRSA colonization surveillance program. It is not intended to diagnose MRSA infection nor to guide or monitor treatment for MRSA infections. Test performance is not FDA approved in patients less than 64 years old. Performed at [facility], [address] [place], [place] [address]     Radiology Studies: ECHOCARDIOGRAM COMPLETE  Result Date: 06/26/2022    ECHOCARDIOGRAM REPORT   Patient Name:   Nicholas Fletcher Date of Exam: 06/25/2022 Medical Rec #:  [id number]       Height:       72.0 in  Accession #:    [id number]      Weight:       180.3 lb Date of Birth:  1976/06/03        BSA:          2.039 m Patient Age:    46 years        BP:           151/96 mmHg Patient Gender: M               HR:           83 bpm. Exam Location:  [facility] Procedure: 2D Echo Indications:     Abnormal ECG R94.31  History:         Patient has no prior history of Echocardiogram examinations.  Sonographer:     Overton Mam RDCS Referring Phys:  [id number] Ezequiel Essex Diagnosing Phys: Arnoldo Hooker MD IMPRESSIONS  1. Left ventricular ejection fraction, by estimation, is 60 to 65%. The left ventricle has normal function. The left ventricle has no regional wall motion abnormalities. Left ventricular diastolic parameters were normal.  2. Right ventricular systolic function is normal. The right ventricular size is normal.  3. The mitral valve is normal in structure. Trivial mitral valve regurgitation.  4. The aortic valve is normal in structure. Aortic valve regurgitation is not visualized. FINDINGS  Left Ventricle: Left ventricular ejection fraction, by estimation, is 60 to 65%. The left ventricle has normal function. The left ventricle has no regional wall motion abnormalities. The left ventricular internal cavity size was normal in size. There is  no left ventricular hypertrophy. Left ventricular diastolic parameters were normal. Right Ventricle: The right ventricular size is normal. No increase in right ventricular wall thickness. Right ventricular systolic function is normal. Left Atrium: Left atrial size was normal in size. Right Atrium: Right atrial size was normal in size. Pericardium: There is no evidence of pericardial effusion. Mitral Valve: The mitral valve is normal in structure. Trivial mitral valve regurgitation. Tricuspid Valve: The tricuspid valve is normal in structure. Tricuspid valve regurgitation is trivial. Aortic Valve: The aortic valve is normal in structure. Aortic valve regurgitation is not visualized. Aortic  valve peak gradient measures 4.9 mmHg. Pulmonic Valve: The pulmonic valve was normal in structure. Pulmonic valve regurgitation is trivial. Aorta: The aortic root and ascending aorta are structurally normal, with no evidence of dilitation. IAS/Shunts: No atrial level shunt detected by color flow Doppler.  LEFT VENTRICLE PLAX 2D LVIDd:         4.97 cm      Diastology LVIDs:         3.33 cm      LV e' medial:    8.70 cm/s LV PW:  1.03 cm      LV E/e' medial:  8.1 LV IVS:        0.97 cm      LV e' lateral:   12.30 cm/s LVOT diam:     2.15 cm      LV E/e' lateral: 5.7 LV SV:         57 LV SV Index:   28 LVOT Area:     3.63 cm  LV Volumes (MOD) LV vol d, MOD A2C: 106.8 ml LV vol d, MOD A4C: 77.5 ml LV vol s, MOD A2C: 37.8 ml LV vol s, MOD A4C: 43.3 ml LV SV MOD A2C:     68.9 ml LV SV MOD A4C:     77.5 ml LV SV MOD BP:      55.5 ml RIGHT VENTRICLE RV Basal diam:  2.62 cm RV S prime:     14.80 cm/s TAPSE (M-mode): 1.8 cm LEFT ATRIUM             Index        RIGHT ATRIUM           Index LA diam:        3.50 cm 1.72 cm/m   RA Area:     14.60 cm LA Vol (A2C):   43.9 ml 21.53 ml/m  RA Volume:   36.10 ml  17.71 ml/m LA Vol (A4C):   21.4 ml 10.50 ml/m LA Biplane Vol: 33.5 ml 16.43 ml/m  AORTIC VALVE                 PULMONIC VALVE AV Area (Vmax): 2.82 cm     PV Vmax:       0.81 m/s AV Vmax:        111.00 cm/s  PV Peak grad:  2.7 mmHg AV Peak Grad:   4.9 mmHg LVOT Vmax:      86.20 cm/s LVOT Vmean:     55.000 cm/s LVOT VTI:       0.156 m  AORTA Ao Root diam: 3.40 cm MITRAL VALVE MV Area (PHT): 4.41 cm    SHUNTS MV Decel Time: 172 msec    Systemic VTI:  0.16 m MV E velocity: 70.30 cm/s  Systemic Diam: 2.15 cm MV A velocity: 60.00 cm/s MV E/A ratio:  1.17 Arnoldo Hooker MD Electronically signed by Arnoldo Hooker MD Signature Date/Time: 06/26/2022/12:31:56 PM    Final    EEG adult  Result Date: 06/25/2022 Charlsie Quest, MD     06/25/2022  3:32 PM Patient Name: Nicholas Fletcher MRN: [id number] Epilepsy Attending:  Charlsie Quest Referring Physician/Provider: Ezequiel Essex, NP Date:  06/25/2022 Duration: 20.43 mins Patient history: 46yo M with ams and seizure in witting of alcohol withdrawal. EEG to evaluate for seizure Level of alertness: Awake, asleep AEDs during EEG study: None Technical aspects: This EEG study was done with scalp electrodes positioned according to the 10-20 International system of electrode placement. Electrical activity was reviewed with band pass filter of 1-70Hz , sensitivity of 7 uV/mm, display speed of 55mm/sec with a 60Hz  notched filter applied as appropriate. EEG data were recorded continuously and digitally stored.  Video monitoring was available and reviewed as appropriate. Description: The posterior dominant rhythm consists of 9-10 Hz activity of moderate voltage (25-35 uV) seen predominantly in posterior head regions, symmetric and reactive to eye opening and eye closing. Sleep was characterized by vertex waves, sleep spindles (12 to 14 Hz), maximal frontocentral region.  There is an  excessive amount of 15 to 18 Hz beta activity distributed symmetrically and diffusely. Physiologic photic driving was seen during photic stimulation.  Hyperventilation was not performed.   ABNORMALITY - Excessive beta, generalized IMPRESSION: This study is within normal limits. The excessive beta activity seen in the background is most likely due to the effect of benzodiazepine and is a benign EEG pattern. No seizures or epileptiform discharges were seen throughout the recording. A normal interictal EEG does not exclude nor support the diagnosis of epilepsy. Charlsie Quest   [place] EKG SITE RITE  Result Date: 06/25/2022 If Site Rite image not attached, placement could not be confirmed due to current cardiac rhythm.  DG Chest Port 1 View  Result Date: 06/24/2022 CLINICAL DATA:  Dizziness EXAM: PORTABLE CHEST 1 VIEW COMPARISON:  09/24/2021 FINDINGS: The heart size and mediastinal contours are within normal limits.  Both lungs are clear. The visualized skeletal structures are unremarkable. IMPRESSION: No active disease. Electronically Signed   By: Ernie Avena M.D.   On: 06/24/2022 18:20   CT HEAD WO CONTRAST ( )  Result Date: 06/24/2022 CLINICAL DATA:  Seizure. EXAM: CT HEAD WITHOUT CONTRAST TECHNIQUE: Contiguous axial images were obtained from the base of the skull through the vertex without intravenous contrast. RADIATION DOSE REDUCTION: This exam was performed according to the departmental dose-optimization program which includes automated exposure control, adjustment of the mA and/or kV according to patient size and/or use of iterative reconstruction technique. COMPARISON:  None Available. FINDINGS: Brain: No evidence of acute infarction, hemorrhage, hydrocephalus, extra-axial collection or mass lesion/mass effect. Vascular: No hyperdense vessel or unexpected calcification. Skull: Normal. Negative for fracture or focal lesion. Sinuses/Orbits: No acute finding. Other: None. IMPRESSION: No acute intracranial abnormality seen. Electronically Signed   By: Lupita Raider M.D.   On: 06/24/2022 15:42     Scheduled Meds:  Chlorhexidine Gluconate Cloth  6 each Topical Q0600   chlorproMAZINE  25 mg Oral TID   enoxaparin (LOVENOX) injection  40 mg Subcutaneous Q24H   folic acid  1 mg Oral Daily   multivitamin with minerals  1 tablet Oral Daily   pantoprazole (PROTONIX) IV  40 mg Intravenous Q12H   sodium chloride flush  10-40 mL Intracatheter Q12H   [START ON 06/28/2022] thiamine (VITAMIN B1) injection  100 mg Intravenous Q24H   Continuous Infusions:  sodium chloride Stopped (06/26/22 0947)   thiamine (VITAMIN B1) injection 100 mL/hr at 06/26/22 1000     LOS: 2 days    Time spent: 50 mins    Juanitta Earnhardt, MD [facility]   If 7PM-7AM, please contact night-coverage

## 2022-06-26 NOTE — Consult Note (Signed)
[facility]  CONSULT NOTE    Date: 06/26/2022                  Patient Name:  Nicholas Fletcher  MRN: [id number]  DOB: 06-19-1976  Age / Sex: 46 y.o., male         PCP: Patient, No Pcp Per                 Service Requesting Consult: TRH                 Reason for Consult: Hyponatremia            History of Present Illness: Mr. Nicholas Fletcher is a 46 y.o.  male with past medical conditions including GI Bleed, diverticulitis, alcohol use, and hepatic steatosis, who was admitted to [facility] on 06/24/2022 for Hypokalemia [E87.6] Hypomagnesemia [E83.42] Hyponatremia [E87.1] Seizures (HCC) [R56.9]   Patient presents to ED with slurred speech, dizziness and tightness in the chest. On arrival, patient's wife states speech became slurred earlier that day along with not acting normally. Patient is seen today resting in bed in ICU with his wife at bedside. Alert and oriented. Wife states appetite remains poor. Patient admits to alcohol use but states he drinks plenty of water. Complains of some indigestion.   Patient found to have sodium 110 on ED arrival. Other pertinent labs include potassium 2.2, serum bicarb 20, glucose, 151, BUN less than 5, and bilirubin 1.4. Serum Osm 232. Chest xray and CT head negative.   Medications: Outpatient medications: Medications Prior to Admission  Medication Sig Dispense Refill Last Dose   omeprazole (PRILOSEC) 20 MG capsule Take 20 mg by mouth daily.   Past Week   ibuprofen (ADVIL) 600 MG tablet Take 600 mg by mouth daily. (Patient not taking: Reported on 06/24/2022)   Not Taking   oxyCODONE-acetaminophen (PERCOCET/ROXICET) 5-325 MG tablet Take 1 tablet by mouth every 4 (four) hours as needed for severe pain. (Patient not taking: Reported on 09/24/2021) 10 tablet 0 Not Taking   pantoprazole (PROTONIX) 40 MG tablet Take 1 tablet (40 mg total) by mouth 2 (two) times daily before a meal. (Patient not taking: Reported on 06/24/2022) 60 tablet 2 Not  Taking   sucralfate (CARAFATE) 1 GM/10ML suspension Take 10 mLs (1 g total) by mouth 4 (four) times daily. (Patient not taking: Reported on 06/24/2022) 1200 mL 2 Not Taking    Current medications: Current Facility-Administered Medications  Medication Dose Route Frequency Provider Last Rate Last Admin   0.9 %  sodium chloride infusion   Intravenous Continuous Judithe Modest, NP   Paused at 06/26/22 0947   acetaminophen (TYLENOL) tablet 650 mg  650 mg Oral Q6H PRN Harlon Ditty D, NP   650 mg at 06/25/22 1140   Chlorhexidine Gluconate Cloth 2 % PADS 6 each  6 each Topical Q0600 Salena Saner, MD   6 each at 06/26/22 0756   chlorproMAZINE (THORAZINE) tablet 25 mg  25 mg Oral TID Judithe Modest, NP   25 mg at 06/26/22 0755   docusate sodium (COLACE) capsule 100 mg  100 mg Oral BID PRN Ezequiel Essex, NP       enoxaparin (LOVENOX) injection 40 mg  40 mg Subcutaneous Q24H Ezequiel Essex, NP   40 mg at 06/25/22 2239   folic acid (FOLVITE) tablet 1 mg  1 mg Oral Daily Ezequiel Essex, NP   1 mg at 06/26/22 0949   LORazepam (  ATIVAN) tablet 1-4 mg  1-4 mg Oral Q1H PRN Ezequiel Essex, NP   1 mg at 06/25/22 1144   Or   LORazepam (ATIVAN) injection 1-4 mg  1-4 mg Intravenous Q1H PRN Ezequiel Essex, NP       multivitamin with minerals tablet 1 tablet  1 tablet Oral Daily Ezequiel Essex, NP   1 tablet at 06/26/22 0949   pantoprazole (PROTONIX) injection 40 mg  40 mg Intravenous Q12H Ezequiel Essex, NP   40 mg at 06/26/22 0948   polyethylene glycol (MIRALAX / GLYCOLAX) packet 17 g  17 g Oral Daily PRN Ezequiel Essex, NP       sodium chloride flush (NS) 0.9 % injection 10-40 mL  10-40 mL Intracatheter Q12H Salena Saner, MD   10 mL at 06/26/22 0949   sodium chloride flush (NS) 0.9 % injection 10-40 mL  10-40 mL Intracatheter PRN Salena Saner, MD       thiamine (VITAMIN B1) 500 mg in normal saline (50 mL) IVPB  500 mg Intravenous Daily Ezequiel Essex, NP 100 mL/hr at 06/26/22 1000 Infusion  Verify at 06/26/22 1000   Followed by   Melene Muller ON 06/28/2022] thiamine (VITAMIN B1) injection 100 mg  100 mg Intravenous Q24H Ezequiel Essex, NP       traMADol Janean Sark) tablet 50 mg  50 mg Oral Q6H PRN Judithe Modest, NP   50 mg at 06/25/22 1140      Allergies: No Known Allergies    Past Medical History: History reviewed. No pertinent past medical history.   Past Surgical History: Past Surgical History:  Procedure Laterality Date   COLONOSCOPY WITH PROPOFOL N/A 09/26/2021   Procedure: COLONOSCOPY WITH PROPOFOL;  Surgeon: Regis Bill, MD;  Location: [facility] ENDOSCOPY;  Service: Endoscopy;  Laterality: N/A;   COLONOSCOPY WITH PROPOFOL N/A 09/28/2021   Procedure: COLONOSCOPY WITH PROPOFOL;  Surgeon: Regis Bill, MD;  Location: [facility] ENDOSCOPY;  Service: Endoscopy;  Laterality: N/A;   ESOPHAGOGASTRODUODENOSCOPY (EGD) WITH PROPOFOL N/A 09/26/2021   Procedure: ESOPHAGOGASTRODUODENOSCOPY (EGD) WITH PROPOFOL;  Surgeon: Regis Bill, MD;  Location: [facility] ENDOSCOPY;  Service: Endoscopy;  Laterality: N/A;   TONSILLECTOMY       Family History: Family History  Family history unknown: Yes     Social History: Social History   Socioeconomic History   Marital status: Married    Spouse name: Not on file   Number of children: Not on file   Years of education: Not on file   Highest education level: Not on file  Occupational History   Not on file  Tobacco Use   Smoking status: Light Smoker    Types: Cigars   Smokeless tobacco: Never  Substance and Sexual Activity   Alcohol use: Yes   Drug use: Not Currently   Sexual activity: Not on file  Other Topics Concern   Not on file  Social History Narrative   Not on file   Social Determinants of Health   Financial Resource Strain: Not on file  Food Insecurity: Not on file  Transportation Needs: Not on file  Physical Activity: Not on file  Stress: Not on file  Social Connections: Not on file  Intimate Partner  Violence: Not on file     Review of Systems: Review of Systems  Constitutional:  Negative for chills, fever and malaise/fatigue.  HENT:  Negative for congestion, sore throat and tinnitus.   Eyes:  Negative for blurred vision and redness.  Respiratory:  Negative for cough, shortness of breath and wheezing.   Cardiovascular:  Positive for chest pain. Negative for palpitations, claudication and leg swelling.  Gastrointestinal:  Negative for abdominal pain, blood in stool, diarrhea, nausea and vomiting.  Genitourinary:  Negative for flank pain, frequency and hematuria.  Musculoskeletal:  Negative for back pain, falls and myalgias.  Skin:  Negative for rash.  Neurological:  Positive for dizziness. Negative for weakness and headaches.  Endo/Heme/Allergies:  Does not bruise/bleed easily.  Psychiatric/Behavioral:  Negative for depression. The patient is not nervous/anxious and does not have insomnia.     Vital Signs: Blood pressure (!) 131/93, pulse 94, temperature 99.4 F (37.4 C), temperature source Oral, resp. rate 17, height 6' (1.829 m), weight 81.8 kg, SpO2 100 %.  Weight trends: Filed Weights   06/24/22 1449 06/24/22 1759 06/25/22 0600  Weight: 79.4 kg 80.4 kg 81.8 kg    Physical Exam: General: NAD,   Head: Normocephalic, atraumatic. Moist oral mucosal membranes  Eyes: Anicteric  Lungs:  Clear to auscultation, normal effort  Heart: Regular rate and rhythm  Abdomen:  Soft, nontender, nondistended  Extremities:  No peripheral edema.  Neurologic: Nonfocal, moving all four extremities  Skin: No lesions  Access: None     Lab results: Basic Metabolic Panel: Recent Labs  Lab 06/24/22 1445 06/24/22 1445 06/24/22 1507 06/24/22 1741 06/24/22 1827 06/24/22 2112 06/25/22 0517 06/25/22 0705 06/25/22 1305 06/25/22 1448 06/25/22 2339 06/26/22 0452 06/26/22 1140  NA 110*  --  115*   < >  --    < > 126*   < > 122*   < > 120* 124* 131*  K  --    < > 2.2*  --  2.7*   < >  3.2*  --  3.5  --   --  3.7  --   CL  --   --  77*  --   --   --  90*  --   --   --   --  92*  --   CO2  --   --  20*  --   --   --  28  --   --   --   --  27  --   GLUCOSE  --   --  151*  --   --   --  112*  --   --   --   --  101*  --   BUN  --   --  <5*  --   --   --  <5*  --   --   --   --  <5*  --   CREATININE  --   --  0.97  --   --   --  0.88  --   --   --   --  0.70  --   CALCIUM  --   --  9.3  --   --   --  8.3*  --   --   --   --  8.5*  --   MG 1.3*  --   --   --  2.0  --  2.1  --  1.9  --   --  1.7  --   PHOS  --   --   --    < >  --   --  3.3  --  3.0  --   --  3.3  --    < > = values in  this interval not displayed.    Liver Function Tests: Recent Labs  Lab 06/24/22 1507 06/25/22 0517 06/26/22 0452  AST 106* 71* 151*  ALT 44 33 31  ALKPHOS 80 54 54  BILITOT 1.4* 1.0 0.8  PROT 7.7 6.0* 5.5*  ALBUMIN 4.4 3.3* 3.1*   No results for input(s): "LIPASE", "AMYLASE" in the last 168 hours. Recent Labs  Lab 06/24/22 1507 06/25/22 0517  AMMONIA 78* 11    CBC: Recent Labs  Lab 06/24/22 1507 06/24/22 1741 06/25/22 0517 06/26/22 0452  WBC 10.4 6.7 5.2 3.5*  HGB 15.6 14.8 13.9 12.2*  HCT 41.9 38.8* 37.9* 33.2*  MCV 94.4 93.3 96.9 96.0  PLT 261 214 217 211    Cardiac Enzymes: No results for input(s): "CKTOTAL", "CKMB", "CKMBINDEX", "TROPONINI" in the last 168 hours.  BNP: Invalid input(s): "POCBNP"  CBG: Recent Labs  Lab 06/25/22 2316 06/26/22 0317 06/26/22 0737 06/26/22 0800 06/26/22 1140  GLUCAP 108* 115* 98 116* 102*    Microbiology: Results for orders placed or performed during the hospital encounter of 06/24/22  SARS Coronavirus 2 by RT PCR (hospital order, performed in [facility]) *cepheid single result test*     Status: None   Collection Time: 06/24/22  4:32 PM   Specimen: Nasal Swab  Result Value Ref Range Status   SARS Coronavirus 2 by RT PCR NEGATIVE NEGATIVE Final    Comment: (NOTE) SARS-CoV-2 target nucleic acids are NOT  DETECTED.  The SARS-CoV-2 RNA is generally detectable in upper and lower respiratory specimens during the acute phase of infection. The lowest concentration of SARS-CoV-2 viral copies this assay can detect is 250 copies / mL. A negative result does not preclude SARS-CoV-2 infection and should not be used as the sole basis for treatment or other patient management decisions.  A negative result may occur with improper specimen collection / handling, submission of specimen other than nasopharyngeal swab, presence of viral mutation(s) within the areas targeted by this assay, and inadequate number of viral copies (<250 copies / mL). A negative result must be combined with clinical observations, patient history, and epidemiological information.  Fact Sheet for Patients:   RoadLapTop.co.zahttps://www.fda.gov/media/158405/download  Fact Sheet for Healthcare Providers: http://kim-miller.com/https://www.fda.gov/media/158404/download  This test is not yet approved or  cleared by the [place] FDA and has been authorized for detection and/or diagnosis of SARS-CoV-2 by FDA under an Emergency Use Authorization (EUA).  This EUA will remain in effect (meaning this test can be used) for the duration of the COVID-19 declaration under Section 564(b)(1) of the Act, 21 U.S.C. section 360bbb-3(b)(1), unless the authorization is terminated or revoked sooner.  Performed at [facility], [address] [place], [place] [address]   MRSA Next Gen by PCR, Nasal     Status: None   Collection Time: 06/24/22  6:07 PM   Specimen: Nasal Mucosa; Nasal Swab  Result Value Ref Range Status   MRSA by PCR Next Gen NOT DETECTED NOT DETECTED Final    Comment: (NOTE) The GeneXpert MRSA Assay (FDA approved for NASAL specimens only), is one component of a comprehensive MRSA colonization surveillance program. It is not intended to diagnose MRSA infection nor to guide or monitor treatment for MRSA infections. Test performance is not FDA  approved in patients less than 48 years old. Performed at [facility], [address] [place], [place] [address]     Coagulation Studies: Recent Labs    06/24/22 2112  LABPROT 12.4  INR 0.9  Urinalysis: Recent Labs    06/24/22 1632  COLORURINE YELLOW*  LABSPEC 1.003*  PHURINE 6.0  GLUCOSEU NEGATIVE  HGBUR SMALL*  BILIRUBINUR NEGATIVE  KETONESUR NEGATIVE  PROTEINUR NEGATIVE  NITRITE NEGATIVE  LEUKOCYTESUR NEGATIVE      Imaging: ECHOCARDIOGRAM COMPLETE  Result Date: 06/26/2022    ECHOCARDIOGRAM REPORT   Patient Name:   HARACE MCCLUNEY Date of Exam: 06/25/2022 Medical Rec #:  [id number]       Height:       72.0 in Accession #:    [id number]      Weight:       180.3 lb Date of Birth:  Feb 23, 1976        BSA:          2.039 m Patient Age:    46 years        BP:           151/96 mmHg Patient Gender: M               HR:           83 bpm. Exam Location:  [facility] Procedure: 2D Echo Indications:     Abnormal ECG R94.31  History:         Patient has no prior history of Echocardiogram examinations.  Sonographer:     Overton Mam RDCS Referring Phys:  [id number] Ezequiel Essex Diagnosing Phys: [place]o Hooker MD IMPRESSIONS  1. Left ventricular ejection fraction, by estimation, is 60 to 65%. The left ventricle has normal function. The left ventricle has no regional wall motion abnormalities. Left ventricular diastolic parameters were normal.  2. Right ventricular systolic function is normal. The right ventricular size is normal.  3. The mitral valve is normal in structure. Trivial mitral valve regurgitation.  4. The aortic valve is normal in structure. Aortic valve regurgitation is not visualized. FINDINGS  Left Ventricle: Left ventricular ejection fraction, by estimation, is 60 to 65%. The left ventricle has normal function. The left ventricle has no regional wall motion abnormalities. The left ventricular internal cavity size was normal in size. There is  no left ventricular hypertrophy.  Left ventricular diastolic parameters were normal. Right Ventricle: The right ventricular size is normal. No increase in right ventricular wall thickness. Right ventricular systolic function is normal. Left Atrium: Left atrial size was normal in size. Right Atrium: Right atrial size was normal in size. Pericardium: There is no evidence of pericardial effusion. Mitral Valve: The mitral valve is normal in structure. Trivial mitral valve regurgitation. Tricuspid Valve: The tricuspid valve is normal in structure. Tricuspid valve regurgitation is trivial. Aortic Valve: The aortic valve is normal in structure. Aortic valve regurgitation is not visualized. Aortic valve peak gradient measures 4.9 mmHg. Pulmonic Valve: The pulmonic valve was normal in structure. Pulmonic valve regurgitation is trivial. Aorta: The aortic root and ascending aorta are structurally normal, with no evidence of dilitation. IAS/Shunts: No atrial level shunt detected by color flow Doppler.  LEFT VENTRICLE PLAX 2D LVIDd:         4.97 cm      Diastology LVIDs:         3.33 cm      LV e' medial:    8.70 cm/s LV PW:         1.03 cm      LV E/e' medial:  8.1 LV IVS:        0.97 cm      LV e' lateral:   12.30 cm/s LVOT diam:  2.15 cm      LV E/e' lateral: 5.7 LV SV:         57 LV SV Index:   28 LVOT Area:     3.63 cm  LV Volumes (MOD) LV vol d, MOD A2C: 106.8 ml LV vol d, MOD A4C: 77.5 ml LV vol s, MOD A2C: 37.8 ml LV vol s, MOD A4C: 43.3 ml LV SV MOD A2C:     68.9 ml LV SV MOD A4C:     77.5 ml LV SV MOD BP:      55.5 ml RIGHT VENTRICLE RV Basal diam:  2.62 cm RV S prime:     14.80 cm/s TAPSE (M-mode): 1.8 cm LEFT ATRIUM             Index        RIGHT ATRIUM           Index LA diam:        3.50 cm 1.72 cm/m   RA Area:     14.60 cm LA Vol (A2C):   43.9 ml 21.53 ml/m  RA Volume:   36.10 ml  17.71 ml/m LA Vol (A4C):   21.4 ml 10.50 ml/m LA Biplane Vol: 33.5 ml 16.43 ml/m  AORTIC VALVE                 PULMONIC VALVE AV Area (Vmax): 2.82 cm     PV  Vmax:       0.81 m/s AV Vmax:        111.00 cm/s  PV Peak grad:  2.7 mmHg AV Peak Grad:   4.9 mmHg LVOT Vmax:      86.20 cm/s LVOT Vmean:     55.000 cm/s LVOT VTI:       0.156 m  AORTA Ao Root diam: 3.40 cm MITRAL VALVE MV Area (PHT): 4.41 cm    SHUNTS MV Decel Time: 172 msec    Systemic VTI:  0.16 m MV E velocity: 70.30 cm/s  Systemic Diam: 2.15 cm MV A velocity: 60.00 cm/s MV E/A ratio:  1.17 Arnoldo Hooker MD Electronically signed by Arnoldo Hooker MD Signature Date/Time: 06/26/2022/12:31:56 PM    Final    EEG adult  Result Date: 06/25/2022 Charlsie Quest, MD     06/25/2022  3:32 PM Patient Name: CARMERON HEADY MRN: [id number] Epilepsy Attending: Charlsie Quest Referring Physician/Provider: Ezequiel Essex, NP Date:  06/25/2022 Duration: 20.43 mins Patient history: 46yo M with ams and seizure in witting of alcohol withdrawal. EEG to evaluate for seizure Level of alertness: Awake, asleep AEDs during EEG study: None Technical aspects: This EEG study was done with scalp electrodes positioned according to the 10-20 International system of electrode placement. Electrical activity was reviewed with band pass filter of 1-70Hz , sensitivity of 7 uV/mm, display speed of 15mm/sec with a 60Hz  notched filter applied as appropriate. EEG data were recorded continuously and digitally stored.  Video monitoring was available and reviewed as appropriate. Description: The posterior dominant rhythm consists of 9-10 Hz activity of moderate voltage (25-35 uV) seen predominantly in posterior head regions, symmetric and reactive to eye opening and eye closing. Sleep was characterized by vertex waves, sleep spindles (12 to 14 Hz), maximal frontocentral region.  There is an excessive amount of 15 to 18 Hz beta activity distributed symmetrically and diffusely. Physiologic photic driving was seen during photic stimulation.  Hyperventilation was not performed.   ABNORMALITY - Excessive beta, generalized IMPRESSION: This study is within  normal limits.  The excessive beta activity seen in the background is most likely due to the effect of benzodiazepine and is a benign EEG pattern. No seizures or epileptiform discharges were seen throughout the recording. A normal interictal EEG does not exclude nor support the diagnosis of epilepsy. Charlsie Quest   [place] EKG SITE RITE  Result Date: 06/25/2022 If Site Rite image not attached, placement could not be confirmed due to current cardiac rhythm.  DG Chest Port 1 View  Result Date: 06/24/2022 CLINICAL DATA:  Dizziness EXAM: PORTABLE CHEST 1 VIEW COMPARISON:  09/24/2021 FINDINGS: The heart size and mediastinal contours are within normal limits. Both lungs are clear. The visualized skeletal structures are unremarkable. IMPRESSION: No active disease. Electronically Signed   By: Ernie Avena M.D.   On: 06/24/2022 18:20   CT HEAD WO CONTRAST ( )  Result Date: 06/24/2022 CLINICAL DATA:  Seizure. EXAM: CT HEAD WITHOUT CONTRAST TECHNIQUE: Contiguous axial images were obtained from the base of the skull through the vertex without intravenous contrast. RADIATION DOSE REDUCTION: This exam was performed according to the departmental dose-optimization program which includes automated exposure control, adjustment of the mA and/or kV according to patient size and/or use of iterative reconstruction technique. COMPARISON:  None Available. FINDINGS: Brain: No evidence of acute infarction, hemorrhage, hydrocephalus, extra-axial collection or mass lesion/mass effect. Vascular: No hyperdense vessel or unexpected calcification. Skull: Normal. Negative for fracture or focal lesion. Sinuses/Orbits: No acute finding. Other: None. IMPRESSION: No acute intracranial abnormality seen. Electronically Signed   By: Lupita Raider M.D.   On: 06/24/2022 15:42     Assessment & Plan: Mr. JAMARIS THEARD is a 46 y.o.  male with past medical conditions including GI Bleed, diverticulitis, alcohol use, and hepatic steatosis,  who was admitted to [facility] on 06/24/2022 for Hypokalemia [E87.6] Hypomagnesemia [E83.42] Hyponatremia [E87.1] Seizures (HCC) [R56.9]  Hyponatremia likely due to poor oral intake. Received 3% hypertonic saline early in admission. Sodium now corrected to 126. Hypertonic saline stopped due to rapid correction. Patient encouraged to increase oral intake. Currently receiving Protonix to manage indigestion. Will continue to monitor.      LOS: 2   9/5/20231:28 PM

## 2022-06-27 ENCOUNTER — Other Ambulatory Visit: Payer: Self-pay

## 2022-06-27 DIAGNOSIS — E43 Unspecified severe protein-calorie malnutrition: Secondary | ICD-10-CM | POA: Insufficient documentation

## 2022-06-27 LAB — SODIUM
Sodium: 134 mmol/L — ABNORMAL LOW (ref 135–145)
Sodium: 131 mmol/L — ABNORMAL LOW (ref 135–145)
Sodium: 133 mmol/L — ABNORMAL LOW (ref 135–145)

## 2022-06-27 LAB — CBC
HCT: 36.6 % — ABNORMAL LOW (ref 39.0–52.0)
Hemoglobin: 13.1 g/dL (ref 13.0–17.0)
MCH: 35 pg — ABNORMAL HIGH (ref 26.0–34.0)
MCHC: 35.8 g/dL (ref 30.0–36.0)
MCV: 97.9 fL (ref 80.0–100.0)
Platelets: 290 10*3/uL (ref 150–400)
RBC: 3.74 MIL/uL — ABNORMAL LOW (ref 4.22–5.81)
RDW: 11.2 % — ABNORMAL LOW (ref 11.5–15.5)
WBC: 4.3 10*3/uL (ref 4.0–10.5)
nRBC: 0 % (ref 0.0–0.2)

## 2022-06-27 LAB — BASIC METABOLIC PANEL
Anion gap: 8 (ref 5–15)
BUN: <5 mg/dL — ABNORMAL LOW (ref 6–20)
CO2: 26 mmol/L (ref 22–32)
Calcium: 8.8 mg/dL — ABNORMAL LOW (ref 8.9–10.3)
Chloride: 99 mmol/L (ref 98–111)
Creatinine, Ser: 0.81 mg/dL (ref 0.61–1.24)
GFR, Estimated: >60 mL/min (ref >60)
Glucose, Bld: 104 mg/dL — ABNORMAL HIGH (ref 70–99)
Potassium: 3.6 mmol/L (ref 3.5–5.1)
Sodium: 133 mmol/L — ABNORMAL LOW (ref 135–145)

## 2022-06-27 LAB — MAGNESIUM: Magnesium: 2 mg/dL (ref 1.7–2.4)

## 2022-06-27 LAB — PHOSPHORUS: Phosphorus: 5.1 mg/dL — ABNORMAL HIGH (ref 2.5–4.6)

## 2022-06-27 MED ORDER — FOLIC ACID 1 MG PO TABS
1.0000 mg | ORAL_TABLET | Freq: Every day | ORAL | 0 refills | Status: DC
  Filled 2022-06-27: qty 90, 90d supply, fill #0

## 2022-06-27 MED ORDER — CHLORPROMAZINE HCL 25 MG PO TABS
25.0000 mg | ORAL_TABLET | Freq: Three times a day (TID) | ORAL | 0 refills | Status: DC | PRN
  Filled 2022-06-27: qty 30, 10d supply, fill #0

## 2022-06-27 MED ORDER — CHLORPROMAZINE HCL 25 MG PO TABS
25.0000 mg | ORAL_TABLET | Freq: Three times a day (TID) | ORAL | Status: DC
  Administered 2022-06-27 (×3): 25 mg via ORAL
  Filled 2022-06-27 (×3): qty 1

## 2022-06-27 NOTE — Progress Notes (Signed)
Nicholas Fletcher to be D/C'd Home per MD order.  Discussed prescriptions and follow up appointments with the patient. Prescriptions electronically submitted medication list explained in detail. Pt and wife verbalized understanding.  Allergies as of 06/27/2022   No Known Allergies      Medication List     STOP taking these medications    ibuprofen 600 MG tablet Commonly known as: ADVIL   oxyCODONE-acetaminophen 5-325 MG tablet Commonly known as: PERCOCET/ROXICET   pantoprazole 40 MG tablet Commonly known as: Protonix   sucralfate 1 GM/10ML suspension Commonly known as: Carafate       TAKE these medications    chlorproMAZINE 25 MG tablet Commonly known as: THORAZINE Take 1 tablet (25 mg total) by mouth 3 (three) times daily as needed for hiccups. (Take 1 tablet (25 mg total) by mouth 3 (three) times daily as needed for hiccoughs.)   folic acid 1 MG tablet Commonly known as: FOLVITE Take 1 tablet (1 mg total) by mouth daily. Start taking on: June 28, 2022   omeprazole 20 MG capsule Commonly known as: PRILOSEC Take 20 mg by mouth daily.        Vitals:   06/27/22 0039 06/27/22 0737  BP: (!) 152/97 135/88  Pulse: 90 85  Resp: 20 20  Temp: 98.2 F (36.8 C) 98.3 F (36.8 C)  SpO2: 100% 100%    Skin clean, dry and intact without evidence of skin break down, no evidence of skin tears noted. IV catheter discontinued intact. Site without signs and symptoms of complications. Dressing and pressure applied. Pt denies pain at this time. No complaints noted.  An After Visit Summary was printed and given to the patient. Patient escorted via WC, and D/C home via private auto.  Nicholas Fletcher Nicholas Fletcher

## 2022-06-27 NOTE — Progress Notes (Signed)
Patient placed in supine position, central/PICC line dressing kit at bedside. Mask applied to both patient and staff. Sterile technique used to removed dressing. Patient instructed to turn head opposite of site, bare down and blow out while picc line removed. Vasoline guaze and sterile 4 x 4 applied, pressure held for 5 minutes. No bleeding noted. Tegaderm applied over gauze. Tip was cut at 38 cm mark. Patient tolerated well

## 2022-06-27 NOTE — Progress Notes (Addendum)
[facility]  ROUNDING NOTE   Subjective:   Patient seen sitting up in bed Partially completed breakfast tray at bedside Appetite improving Denies nausea and vomiting  Objective:  Vital signs in last 24 hours:  Temp:  [98.2 F (36.8 C)-99.6 F (37.6 C)] 98.3 F (36.8 C) (09/06 0737) Pulse Rate:  [85-102] 85 (09/06 0737) Resp:  [14-23] 20 (09/06 0737) BP: (126-164)/(85-99) 135/88 (09/06 0737) SpO2:  [98 %-100 %] 100 % (09/06 0737) Weight:  [74.3 kg-75.2 kg] 74.3 kg (09/06 0551)  Weight change:  Filed Weights   06/25/22 0600 06/26/22 1542 06/27/22 0551  Weight: 81.8 kg 75.2 kg 74.3 kg    Intake/Output: I/O last 3 completed shifts: In: 2090.2 [P.O.:1914; I.V.:155.6; IV Piggyback:20.6] Out: 8625 [Urine:8625]   Intake/Output this shift:  Total I/O In: 130.2 [I.V.:50.5; IV Piggyback:79.7] Out: -   Physical Exam: General: NAD  Head: Normocephalic, atraumatic. Moist oral mucosal membranes  Eyes: Anicteric  Lungs:  Clear to auscultation, normal effort, room air  Heart: Regular rate and rhythm  Abdomen:  Soft, nontender  Extremities:  No peripheral edema.  Neurologic: Nonfocal, moving all four extremities  Skin: No lesions  Access: None    Basic Metabolic Panel: Recent Labs  Lab 06/24/22 1507 06/24/22 1741 06/24/22 1827 06/24/22 2112 06/25/22 0246 06/25/22 0517 06/25/22 0705 06/25/22 1305 06/25/22 1448 06/26/22 0452 06/26/22 1140 06/26/22 1710 06/26/22 2125 06/27/22 0119 06/27/22 0531 06/27/22 0815  NA 115* 112*  --    < > 126* 126*   < > 122*   < > 124*   < > 131* 133* 134* 133* 131*  K 2.2*  --  2.7*   < > 3.3* 3.2*  --  3.5  --  3.7  --   --   --   --  3.6  --   CL 77*  --   --   --   --  90*  --   --   --  92*  --   --   --   --  99  --   CO2 20*  --   --   --   --  28  --   --   --  27  --   --   --   --  26  --   GLUCOSE 151*  --   --   --   --  112*  --   --   --  101*  --   --   --   --  104*  --   BUN <5*  --   --   --   --  <5*   --   --   --  <5*  --   --   --   --  <5*  --   CREATININE 0.97  --   --   --   --  0.88  --   --   --  0.70  --   --   --   --  0.81  --   CALCIUM 9.3  --   --   --   --  8.3*  --   --   --  8.5*  --   --   --   --  8.8*  --   MG  --   --  2.0  --   --  2.1  --  1.9  --  1.7  --   --   --   --  2.0  --   PHOS  --  1.7*  --   --   --  3.3  --  3.0  --  3.3  --   --   --   --  5.1*  --    < > = values in this interval not displayed.    Liver Function Tests: Recent Labs  Lab 06/24/22 1507 06/25/22 0517 06/26/22 0452  AST 106* 71* 151*  ALT 44 33 31  ALKPHOS 80 54 54  BILITOT 1.4* 1.0 0.8  PROT 7.7 6.0* 5.5*  ALBUMIN 4.4 3.3* 3.1*   No results for input(s): "LIPASE", "AMYLASE" in the last 168 hours. Recent Labs  Lab 06/24/22 1507 06/25/22 0517  AMMONIA 78* 11    CBC: Recent Labs  Lab 06/24/22 1507 06/24/22 1741 06/25/22 0517 06/26/22 0452 06/27/22 0531  WBC 10.4 6.7 5.2 3.5* 4.3  HGB 15.6 14.8 13.9 12.2* 13.1  HCT 41.9 38.8* 37.9* 33.2* 36.6*  MCV 94.4 93.3 96.9 96.0 97.9  PLT 261 214 217 211 290    Cardiac Enzymes: No results for input(s): "CKTOTAL", "CKMB", "CKMBINDEX", "TROPONINI" in the last 168 hours.  BNP: Invalid input(s): "POCBNP"  CBG: Recent Labs  Lab 06/25/22 2316 06/26/22 0317 06/26/22 0737 06/26/22 0800 06/26/22 1140  GLUCAP 108* 115* 98 116* 102*    Microbiology: Results for orders placed or performed during the hospital encounter of 06/24/22  SARS Coronavirus 2 by RT PCR (hospital order, performed in [facility] hospital lab) *cepheid single result test*     Status: None   Collection Time: 06/24/22  4:32 PM   Specimen: Nasal Swab  Result Value Ref Range Status   SARS Coronavirus 2 by RT PCR NEGATIVE NEGATIVE Final    Comment: (NOTE) SARS-CoV-2 target nucleic acids are NOT DETECTED.  The SARS-CoV-2 RNA is generally detectable in upper and lower respiratory specimens during the acute phase of infection. The lowest concentration of  SARS-CoV-2 viral copies this assay can detect is 250 copies / mL. A negative result does not preclude SARS-CoV-2 infection and should not be used as the sole basis for treatment or other patient management decisions.  A negative result may occur with improper specimen collection / handling, submission of specimen other than nasopharyngeal swab, presence of viral mutation(s) within the areas targeted by this assay, and inadequate number of viral copies (<250 copies / mL). A negative result must be combined with clinical observations, patient history, and epidemiological information.  Fact Sheet for Patients:   https://www.patel.info/  Fact Sheet for Healthcare Providers: https://hall.com/  This test is not yet approved or  cleared by the [place] FDA and has been authorized for detection and/or diagnosis of SARS-CoV-2 by FDA under an Emergency Use Authorization (EUA).  This EUA will remain in effect (meaning this test can be used) for the duration of the COVID-19 declaration under Section 564(b)(1) of the Act, 21 U.S.C. section 360bbb-3(b)(1), unless the authorization is terminated or revoked sooner.  Performed at [facility], [address] [place], [place] [address]   MRSA Next Gen by PCR, Nasal     Status: None   Collection Time: 06/24/22  6:07 PM   Specimen: Nasal Mucosa; Nasal Swab  Result Value Ref Range Status   MRSA by PCR Next Gen NOT DETECTED NOT DETECTED Final    Comment: (NOTE) The GeneXpert MRSA Assay (FDA approved for NASAL specimens only), is one component of a comprehensive MRSA colonization surveillance program. It is not intended to diagnose MRSA  infection nor to guide or monitor treatment for MRSA infections. Test performance is not FDA approved in patients less than 88 years old. Performed at [facility], [address] [place], [place] [address]     Coagulation Studies: Recent Labs     06/24/22 2112  LABPROT 12.4  INR 0.9    Urinalysis: Recent Labs    06/24/22 1632  COLORURINE YELLOW*  LABSPEC 1.003*  PHURINE 6.0  GLUCOSEU NEGATIVE  HGBUR SMALL*  BILIRUBINUR NEGATIVE  KETONESUR NEGATIVE  PROTEINUR NEGATIVE  NITRITE NEGATIVE  LEUKOCYTESUR NEGATIVE      Imaging: ECHOCARDIOGRAM COMPLETE  Result Date: 06/26/2022    ECHOCARDIOGRAM REPORT   Patient Name:   Nicholas Fletcher Date of Exam: 06/25/2022 Medical Rec #:  [id number]       Height:       72.0 in Accession #:    [id number]      Weight:       180.3 lb Date of Birth:  1976/08/19        BSA:          2.039 m Patient Age:    46 years        BP:           151/96 mmHg Patient Gender: M               HR:           83 bpm. Exam Location:  [facility] Procedure: 2D Echo Indications:     Abnormal ECG R94.31  History:         Patient has no prior history of Echocardiogram examinations.  Sonographer:     Overton Mam RDCS Referring Phys:  [id number] Ezequiel Essex Diagnosing Phys: Arnoldo Hooker MD IMPRESSIONS  1. Left ventricular ejection fraction, by estimation, is 60 to 65%. The left ventricle has normal function. The left ventricle has no regional wall motion abnormalities. Left ventricular diastolic parameters were normal.  2. Right ventricular systolic function is normal. The right ventricular size is normal.  3. The mitral valve is normal in structure. Trivial mitral valve regurgitation.  4. The aortic valve is normal in structure. Aortic valve regurgitation is not visualized. FINDINGS  Left Ventricle: Left ventricular ejection fraction, by estimation, is 60 to 65%. The left ventricle has normal function. The left ventricle has no regional wall motion abnormalities. The left ventricular internal cavity size was normal in size. There is  no left ventricular hypertrophy. Left ventricular diastolic parameters were normal. Right Ventricle: The right ventricular size is normal. No increase in right ventricular wall thickness. Right  ventricular systolic function is normal. Left Atrium: Left atrial size was normal in size. Right Atrium: Right atrial size was normal in size. Pericardium: There is no evidence of pericardial effusion. Mitral Valve: The mitral valve is normal in structure. Trivial mitral valve regurgitation. Tricuspid Valve: The tricuspid valve is normal in structure. Tricuspid valve regurgitation is trivial. Aortic Valve: The aortic valve is normal in structure. Aortic valve regurgitation is not visualized. Aortic valve peak gradient measures 4.9 mmHg. Pulmonic Valve: The pulmonic valve was normal in structure. Pulmonic valve regurgitation is trivial. Aorta: The aortic root and ascending aorta are structurally normal, with no evidence of dilitation. IAS/Shunts: No atrial level shunt detected by color flow Doppler.  LEFT VENTRICLE PLAX 2D LVIDd:         4.97 cm      Diastology LVIDs:         3.33 cm  LV e' medial:    8.70 cm/s LV PW:         1.03 cm      LV E/e' medial:  8.1 LV IVS:        0.97 cm      LV e' lateral:   12.30 cm/s LVOT diam:     2.15 cm      LV E/e' lateral: 5.7 LV SV:         57 LV SV Index:   28 LVOT Area:     3.63 cm  LV Volumes (MOD) LV vol d, MOD A2C: 106.8 ml LV vol d, MOD A4C: 77.5 ml LV vol s, MOD A2C: 37.8 ml LV vol s, MOD A4C: 43.3 ml LV SV MOD A2C:     68.9 ml LV SV MOD A4C:     77.5 ml LV SV MOD BP:      55.5 ml RIGHT VENTRICLE RV Basal diam:  2.62 cm RV S prime:     14.80 cm/s TAPSE (M-mode): 1.8 cm LEFT ATRIUM             Index        RIGHT ATRIUM           Index LA diam:        3.50 cm 1.72 cm/m   RA Area:     14.60 cm LA Vol (A2C):   43.9 ml 21.53 ml/m  RA Volume:   36.10 ml  17.71 ml/m LA Vol (A4C):   21.4 ml 10.50 ml/m LA Biplane Vol: 33.5 ml 16.43 ml/m  AORTIC VALVE                 PULMONIC VALVE AV Area (Vmax): 2.82 cm     PV Vmax:       0.81 m/s AV Vmax:        111.00 cm/s  PV Peak grad:  2.7 mmHg AV Peak Grad:   4.9 mmHg LVOT Vmax:      86.20 cm/s LVOT Vmean:     55.000 cm/s LVOT  VTI:       0.156 m  AORTA Ao Root diam: 3.40 cm MITRAL VALVE MV Area (PHT): 4.41 cm    SHUNTS MV Decel Time: 172 msec    Systemic VTI:  0.16 m MV E velocity: 70.30 cm/s  Systemic Diam: 2.15 cm MV A velocity: 60.00 cm/s MV E/A ratio:  1.17 Arnoldo Hooker MD Electronically signed by Arnoldo Hooker MD Signature Date/Time: 06/26/2022/12:31:56 PM    Final    EEG adult  Result Date: 06/25/2022 Charlsie Quest, MD     06/25/2022  3:32 PM Patient Name: Nicholas Fletcher MRN: [id number] Epilepsy Attending: Charlsie Quest Referring Physician/Provider: Ezequiel Essex, NP Date:  06/25/2022 Duration: 20.43 mins Patient history: 46yo M with ams and seizure in witting of alcohol withdrawal. EEG to evaluate for seizure Level of alertness: Awake, asleep AEDs during EEG study: None Technical aspects: This EEG study was done with scalp electrodes positioned according to the 10-20 International system of electrode placement. Electrical activity was reviewed with band pass filter of 1-70Hz , sensitivity of 7 uV/mm, display speed of 42mm/sec with a 60Hz  notched filter applied as appropriate. EEG data were recorded continuously and digitally stored.  Video monitoring was available and reviewed as appropriate. Description: The posterior dominant rhythm consists of 9-10 Hz activity of moderate voltage (25-35 uV) seen predominantly in posterior head regions, symmetric and reactive to eye opening and eye closing. Sleep  was characterized by vertex waves, sleep spindles (12 to 14 Hz), maximal frontocentral region.  There is an excessive amount of 15 to 18 Hz beta activity distributed symmetrically and diffusely. Physiologic photic driving was seen during photic stimulation.  Hyperventilation was not performed.   ABNORMALITY - Excessive beta, generalized IMPRESSION: This study is within normal limits. The excessive beta activity seen in the background is most likely due to the effect of benzodiazepine and is a benign EEG pattern. No seizures or  epileptiform discharges were seen throughout the recording. A normal interictal EEG does not exclude nor support the diagnosis of epilepsy. Priyanka Annabelle Harman     Medications:    sodium chloride Stopped (06/26/22 1524)    Chlorhexidine Gluconate Cloth  6 each Topical Q0600   chlorproMAZINE  25 mg Oral TID   enoxaparin (LOVENOX) injection  40 mg Subcutaneous Q24H   folic acid  1 mg Oral Daily   multivitamin with minerals  1 tablet Oral Daily   naltrexone  50 mg Oral Daily   pantoprazole (PROTONIX) IV  40 mg Intravenous Q12H   sodium chloride flush  10-40 mL Intracatheter Q12H   [START ON 06/28/2022] thiamine (VITAMIN B1) injection  100 mg Intravenous Q24H   acetaminophen, docusate sodium, LORazepam **OR** LORazepam, polyethylene glycol, simethicone, sodium chloride flush, traMADol  Assessment/ Plan:  Nicholas Fletcher is a 46 y.o.  male with past medical conditions including GI Bleed, diverticulitis, alcohol use, and hepatic steatosis, who was admitted to [facility] on 06/24/2022 for Hypokalemia [E87.6] Hypomagnesemia [E83.42] Hyponatremia [E87.1] Seizures (HCC) [R56.9]   Hyponatremia likely due to poor oral intake. Sodium on admission 110. Received 3% hypertonic saline early in admission. Sodium now corrected to 126. Hypertonic saline stopped. Sodium 133 today.  Patient encouraged to increase oral intake and eliminate alcohol use.    Due to sodium correction, we will sign off at this time.    LOS: 3   9/6/202312:48 PM

## 2022-06-27 NOTE — Progress Notes (Signed)
MEDICATION RELATED CONSULT NOTE - INITIAL   Pharmacy Consult for 3% NaCl Indication: Severe hyponatremia  No Known Allergies  Patient Measurements: Height: (P) 6' (182.9 cm) Weight: 74.3 kg (163 lb 12.8 oz) IBW/kg (Calculated) : (P) 77.6   Vital Signs: Temp: 98.3 F (36.8 C) (09/06 0737) Temp Source: Oral (09/06 0039) BP: 135/88 (09/06 0737) Pulse Rate: 85 (09/06 0737) Intake/Output from previous day: 09/05 0701 - 09/06 0700 In: 1241.3 [P.O.:1194; I.V.:26.7; IV Piggyback:20.6] Out: 5875 [Urine:5875] Intake/Output from this shift: No intake/output data recorded.  Labs: Recent Labs    06/24/22 1507 06/24/22 1741 06/24/22 2112 06/25/22 0517 06/25/22 1305 06/26/22 0452 06/27/22 0531  WBC 10.4   < >  --  5.2  --  3.5* 4.3  HGB 15.6   < >  --  13.9  --  12.2* 13.1  HCT 41.9   < >  --  37.9*  --  33.2* 36.6*  PLT 261   < >  --  217  --  211 290  APTT  --   --  26  --   --   --   --   CREATININE 0.97  --   --  0.88  --  0.70 0.81  MG  --    < >  --  2.1 1.9 1.7 2.0  PHOS  --    < >  --  3.3 3.0 3.3 5.1*  ALBUMIN 4.4  --   --  3.3*  --  3.1*  --   PROT 7.7  --   --  6.0*  --  5.5*  --   AST 106*  --   --  71*  --  151*  --   ALT 44  --   --  33  --  31  --   ALKPHOS 80  --   --  54  --  54  --   BILITOT 1.4*  --   --  1.0  --  0.8  --   BILIDIR  --   --   --  0.2  --  0.2  --   IBILI  --   --   --  0.8  --  0.6  --    < > = values in this interval not displayed.    Estimated Creatinine Clearance: 117.2 mL/min (by C-G formula based on SCr of 0.81 mg/dL).   Microbiology: Recent Results (from the past 720 hour(s))  SARS Coronavirus 2 by RT PCR (hospital order, performed in [facility] hospital lab) *cepheid single result test*     Status: None   Collection Time: 06/24/22  4:32 PM   Specimen: Nasal Swab  Result Value Ref Range Status   SARS Coronavirus 2 by RT PCR NEGATIVE NEGATIVE Final    Comment: (NOTE) SARS-CoV-2 target nucleic acids are NOT DETECTED.  The  SARS-CoV-2 RNA is generally detectable in upper and lower respiratory specimens during the acute phase of infection. The lowest concentration of SARS-CoV-2 viral copies this assay can detect is 250 copies / mL. A negative result does not preclude SARS-CoV-2 infection and should not be used as the sole basis for treatment or other patient management decisions.  A negative result may occur with improper specimen collection / handling, submission of specimen other than nasopharyngeal swab, presence of viral mutation(s) within the areas targeted by this assay, and inadequate number of viral copies (<250 copies / mL). A negative result must be combined with clinical observations, patient history,  and epidemiological information.  Fact Sheet for Patients:   RoadLapTop.co.za  Fact Sheet for Healthcare Providers: http://kim-miller.com/  This test is not yet approved or  cleared by the [place] FDA and has been authorized for detection and/or diagnosis of SARS-CoV-2 by FDA under an Emergency Use Authorization (EUA).  This EUA will remain in effect (meaning this test can be used) for the duration of the COVID-19 declaration under Section 564(b)(1) of the Act, 21 U.S.C. section 360bbb-3(b)(1), unless the authorization is terminated or revoked sooner.  Performed at [facility], [address] [place], [place] [address]   MRSA Next Gen by PCR, Nasal     Status: None   Collection Time: 06/24/22  6:07 PM   Specimen: Nasal Mucosa; Nasal Swab  Result Value Ref Range Status   MRSA by PCR Next Gen NOT DETECTED NOT DETECTED Final    Comment: (NOTE) The GeneXpert MRSA Assay (FDA approved for NASAL specimens only), is one component of a comprehensive MRSA colonization surveillance program. It is not intended to diagnose MRSA infection nor to guide or monitor treatment for MRSA infections. Test performance is not FDA approved in patients  less than 80 years old. Performed at [facility], [address] [place], [place] [address]     Medical History: History reviewed. No pertinent past medical history.  Assessment: Nicholas Fletcher is a 46 y.o. male with PMH significant for ETOH Abuse and Alcoholic Hepatitis who presented with dizziness, slurred speech, chest tightness, and cramping of the hands. Last reported alcoholic drink was 8/28 or 8/29. Patient reportedly drinks 750 mL of vodka over a 2 day period and drinks daily. Wile being triaged in the ED he has a witnessed tonic-clonic seizure. Na on admission was found to be 110, and hypertonic saline was initiated. Pharmacy has been consulted to help manage hypertonic saline infusion.  Baseline: 9/3 @ 1445 Na 110   Goal of Therapy:  No increase in Na > 4 mEq in 2 hrs No increase in Na > 6 mEq in 4 hrs Goal increase in Na ~ 8 mEq in 24 hrs  Monitoring: Date  Time Na Comment/Action  9/3 1445 110 Initiate 3% saline at 30 mL/hr  9/3 1507 115 Continue 3% saline at 30 mL/hr  9/3 1741 112 Continue 3% saline at 30 mL/hr  9/3 2112 124 Contacted NP, suggested may be due to lab error, NP agreed to do stat repeat of Na  9/3 2242 126 Previous reading valid, hold 3% NaCl, DDAVP given 9/3 @ 2328  9/4 0034 127 D5W 500 mL X 1 ordered  9/4 0246 126 Additional DDAVP 2 mcg IV X 1 ordered   9/4 0517 126 DDAVP 2 mcg given @ 0448, NP to order additional D5W 500 mL bolus   9/4 0705 125 Continue D5W@100  mL/hr  9/4 0839 126 Continue D5W@100  mL/hr  9/4 1016 128 DDAVP ordered x 2 doses  9/4 1305 122 Continue D5W@100  mL/hr, DDAVP 1 mcg x1 given @ 1316  9/4 1448 121 Continue D5W@100  mL/hr  9/4 1634 120 Continue D5W@100  mL/hr, 9/4 2035 120 Continue D5W@100  mL/hr  9/5 0452 124 D5W ON hold (off)  9/5 1140 131    9/6 --  131 > 133 > 134 > 133 (remains off; encourage for PO fluids)   Plan:  NaCL 3% Consult discontinued; remains off further treatment. Will CTM peripherally. Nephro  managing. Serial monitoring of Na Q4H remains active  Consult remains for electrolyte monitoring, however other lytes  remain WNL at this time not requiring repletion. Scr WNL, slight bump 0.7>0.81 but maintains good UOP 1.4>3.3 ml/k/h so will watch closely tomorrow if needs arise.  Thank you for allowing pharmacy to be a part of this patient's care.  Martyn Malay Clinical Pharmacist 06/27/2022 8:58 AM

## 2022-06-27 NOTE — Discharge Summary (Addendum)
Physician Discharge Summary   Patient: Nicholas Fletcher MRN: [id number] DOB: May 28, 1976  Admit date:     06/24/2022  Discharge date: 06/27/22  Discharge Physician: Lorella Nimrod   PCP: Patient, No Pcp Per   Recommendations at discharge:  Please obtain CBC and BMP early next week. Follow-up with primary care provider within a week  Discharge Diagnoses: Principal Problem:   Hyponatremia Active Problems:   Alcohol use disorder   Acute metabolic encephalopathy   ETOH abuse   Protein-calorie malnutrition, severe   Hospital Course: Taken from prior notes.  46 years old male with history of EtOH abuse and alcoholic hepatitis presented in the ED with c/o: dizziness, slurred speech and chest tightness and cramping of the hands.  Patient's wife reports the slurred speech started today in the afternoon and patient has not been acting like himself.  Patient drinks regularly.  He also endorses he started vaping 3 to 4 months ago.  Patient has  witnessed tonic-clonic seizures in the ER . He was confused afterwards. Patient has received 2 mg of IV Ativan.  Wife denies any prior history of seizures.  ED course.  Patient was found to have sodium of 110, potassium of 2.2, anion gap 18.  UDS was negative. CT head was negative for any acute abnormality.  Patient was admitted for hyponatremia and nephrology was consulted. Patient received 2 units of DD VAP and was started on hypertonic saline.  Hyponatremia was thought to be due to poor p.o. intake and excessive alcohol use.  Slowly corrected up to 133 appropriately. Patient started eating and drinking and appears to be at his baseline.  He was counseled extensively against alcohol abuse and the importance of eating healthy.  Seizure-like activity most likely secondary to acute hyponatremia versus alcohol withdrawal.  EEG was negative for any seizures.  Patient does not need any antiseizure medications at this time.  Electrolyte abnormalities were  repleted.  Patient will continue on current medication and need to have a close follow-up with his PCP for further recommendations. He will need continuation of counseling for alcohol abuse.  Consultants: Nephrology Procedures performed: EEG Disposition: Home Diet recommendation:  Discharge Diet Orders (From admission, onward)     Start     Ordered   06/27/22 0000  Diet - low sodium heart healthy        06/27/22 1306           Regular diet DISCHARGE MEDICATION: Allergies as of 06/27/2022   No Known Allergies      Medication List     STOP taking these medications    ibuprofen 600 MG tablet Commonly known as: ADVIL   oxyCODONE-acetaminophen 5-325 MG tablet Commonly known as: PERCOCET/ROXICET   pantoprazole 40 MG tablet Commonly known as: Protonix   sucralfate 1 GM/10ML suspension Commonly known as: Carafate       TAKE these medications    chlorproMAZINE 25 MG tablet Commonly known as: THORAZINE Take 1 tablet (25 mg total) by mouth 3 (three) times daily as needed for hiccoughs.   folic acid 1 MG tablet Commonly known as: FOLVITE Take 1 tablet (1 mg total) by mouth daily. Start taking on: June 28, 2022   omeprazole 20 MG capsule Commonly known as: PRILOSEC Take 20 mg by mouth daily.        Discharge Exam: Filed Weights   06/25/22 0600 06/26/22 1542 06/27/22 0551  Weight: 81.8 kg 75.2 kg 74.3 kg   General.     In no acute  distress. Pulmonary.  Lungs clear bilaterally, normal respiratory effort. CV.  Regular rate and rhythm, no JVD, rub or murmur. Abdomen.  Soft, nontender, nondistended, BS positive. CNS.  Alert and oriented .  No focal neurologic deficit. Extremities.  No edema, no cyanosis, pulses intact and symmetrical. Psychiatry.  Judgment and insight appears normal.   Condition at discharge: stable  The results of significant diagnostics from this hospitalization (including imaging, microbiology, ancillary and laboratory) are listed  below for reference.   Imaging Studies: ECHOCARDIOGRAM COMPLETE  Result Date: 06/26/2022    ECHOCARDIOGRAM REPORT   Patient Name:   VIAAN KNIPPENBERG Date of Exam: 06/25/2022 Medical Rec #:  [id number]       Height:       72.0 in Accession #:    [id number]      Weight:       180.3 lb Date of Birth:  1976/03/11        BSA:          2.039 m Patient Age:    46 years        BP:           151/96 mmHg Patient Gender: M               HR:           83 bpm. Exam Location:  [facility] Procedure: 2D Echo Indications:     Abnormal ECG R94.31  History:         Patient has no prior history of Echocardiogram examinations.  Sonographer:     Overton Mam RDCS Referring Phys:  [id number] Ezequiel Essex Diagnosing Phys: Arnoldo Hooker MD IMPRESSIONS  1. Left ventricular ejection fraction, by estimation, is 60 to 65%. The left ventricle has normal function. The left ventricle has no regional wall motion abnormalities. Left ventricular diastolic parameters were normal.  2. Right ventricular systolic function is normal. The right ventricular size is normal.  3. The mitral valve is normal in structure. Trivial mitral valve regurgitation.  4. The aortic valve is normal in structure. Aortic valve regurgitation is not visualized. FINDINGS  Left Ventricle: Left ventricular ejection fraction, by estimation, is 60 to 65%. The left ventricle has normal function. The left ventricle has no regional wall motion abnormalities. The left ventricular internal cavity size was normal in size. There is  no left ventricular hypertrophy. Left ventricular diastolic parameters were normal. Right Ventricle: The right ventricular size is normal. No increase in right ventricular wall thickness. Right ventricular systolic function is normal. Left Atrium: Left atrial size was normal in size. Right Atrium: Right atrial size was normal in size. Pericardium: There is no evidence of pericardial effusion. Mitral Valve: The mitral valve is normal in structure. Trivial mitral  valve regurgitation. Tricuspid Valve: The tricuspid valve is normal in structure. Tricuspid valve regurgitation is trivial. Aortic Valve: The aortic valve is normal in structure. Aortic valve regurgitation is not visualized. Aortic valve peak gradient measures 4.9 mmHg. Pulmonic Valve: The pulmonic valve was normal in structure. Pulmonic valve regurgitation is trivial. Aorta: The aortic root and ascending aorta are structurally normal, with no evidence of dilitation. IAS/Shunts: No atrial level shunt detected by color flow Doppler.  LEFT VENTRICLE PLAX 2D LVIDd:         4.97 cm      Diastology LVIDs:         3.33 cm      LV e' medial:    8.70 cm/s LV PW:  1.03 cm      LV E/e' medial:  8.1 LV IVS:        0.97 cm      LV e' lateral:   12.30 cm/s LVOT diam:     2.15 cm      LV E/e' lateral: 5.7 LV SV:         57 LV SV Index:   28 LVOT Area:     3.63 cm  LV Volumes (MOD) LV vol d, MOD A2C: 106.8 ml LV vol d, MOD A4C: 77.5 ml LV vol s, MOD A2C: 37.8 ml LV vol s, MOD A4C: 43.3 ml LV SV MOD A2C:     68.9 ml LV SV MOD A4C:     77.5 ml LV SV MOD BP:      55.5 ml RIGHT VENTRICLE RV Basal diam:  2.62 cm RV S prime:     14.80 cm/s TAPSE (M-mode): 1.8 cm LEFT ATRIUM             Index        RIGHT ATRIUM           Index LA diam:        3.50 cm 1.72 cm/m   RA Area:     14.60 cm LA Vol (A2C):   43.9 ml 21.53 ml/m  RA Volume:   36.10 ml  17.71 ml/m LA Vol (A4C):   21.4 ml 10.50 ml/m LA Biplane Vol: 33.5 ml 16.43 ml/m  AORTIC VALVE                 PULMONIC VALVE AV Area (Vmax): 2.82 cm     PV Vmax:       0.81 m/s AV Vmax:        111.00 cm/s  PV Peak grad:  2.7 mmHg AV Peak Grad:   4.9 mmHg LVOT Vmax:      86.20 cm/s LVOT Vmean:     55.000 cm/s LVOT VTI:       0.156 m  AORTA Ao Root diam: 3.40 cm MITRAL VALVE MV Area (PHT): 4.41 cm    SHUNTS MV Decel Time: 172 msec    Systemic VTI:  0.16 m MV E velocity: 70.30 cm/s  Systemic Diam: 2.15 cm MV A velocity: 60.00 cm/s MV E/A ratio:  1.17 Arnoldo Hooker MD Electronically  signed by Arnoldo Hooker MD Signature Date/Time: 06/26/2022/12:31:56 PM    Final    EEG adult  Result Date: 06/25/2022 Charlsie Quest, MD     06/25/2022  3:32 PM Patient Name: GRAHAM HYUN MRN: [id number] Epilepsy Attending: Charlsie Quest Referring Physician/Provider: Ezequiel Essex, NP Date:  06/25/2022 Duration: 20.43 mins Patient history: 46yo M with ams and seizure in witting of alcohol withdrawal. EEG to evaluate for seizure Level of alertness: Awake, asleep AEDs during EEG study: None Technical aspects: This EEG study was done with scalp electrodes positioned according to the 10-20 International system of electrode placement. Electrical activity was reviewed with band pass filter of 1-70Hz , sensitivity of 7 uV/mm, display speed of 94mm/sec with a 60Hz  notched filter applied as appropriate. EEG data were recorded continuously and digitally stored.  Video monitoring was available and reviewed as appropriate. Description: The posterior dominant rhythm consists of 9-10 Hz activity of moderate voltage (25-35 uV) seen predominantly in posterior head regions, symmetric and reactive to eye opening and eye closing. Sleep was characterized by vertex waves, sleep spindles (12 to 14 Hz), maximal frontocentral region.  There is an  excessive amount of 15 to 18 Hz beta activity distributed symmetrically and diffusely. Physiologic photic driving was seen during photic stimulation.  Hyperventilation was not performed.   ABNORMALITY - Excessive beta, generalized IMPRESSION: This study is within normal limits. The excessive beta activity seen in the background is most likely due to the effect of benzodiazepine and is a benign EEG pattern. No seizures or epileptiform discharges were seen throughout the recording. A normal interictal EEG does not exclude nor support the diagnosis of epilepsy. Priyanka O Yadav   [place] EKG SITE RITE  Result Date: 06/25/2022 If Site Rite image not attached, placement could not be confirmed due  to current cardiac rhythm.  DG Chest Port 1 View  Result Date: 06/24/2022 CLINICAL DATA:  Dizziness EXAM: PORTABLE CHEST 1 VIEW COMPARISON:  09/24/2021 FINDINGS: The heart size and mediastinal contours are within normal limits. Both lungs are clear. The visualized skeletal structures are unremarkable. IMPRESSION: No active disease. Electronically Signed   By: Elmer Picker M.D.   On: 06/24/2022 18:20   CT HEAD WO CONTRAST (5MM)  Result Date: 06/24/2022 CLINICAL DATA:  Seizure. EXAM: CT HEAD WITHOUT CONTRAST TECHNIQUE: Contiguous axial images were obtained from the base of the skull through the vertex without intravenous contrast. RADIATION DOSE REDUCTION: This exam was performed according to the departmental dose-optimization program which includes automated exposure control, adjustment of the mA and/or kV according to patient size and/or use of iterative reconstruction technique. COMPARISON:  None Available. FINDINGS: Brain: No evidence of acute infarction, hemorrhage, hydrocephalus, extra-axial collection or mass lesion/mass effect. Vascular: No hyperdense vessel or unexpected calcification. Skull: Normal. Negative for fracture or focal lesion. Sinuses/Orbits: No acute finding. Other: None. IMPRESSION: No acute intracranial abnormality seen. Electronically Signed   By: Marijo Conception M.D.   On: 06/24/2022 15:42    Microbiology: Results for orders placed or performed during the hospital encounter of 06/24/22  SARS Coronavirus 2 by RT PCR (hospital order, performed in [facility] hospital lab) *cepheid single result test*     Status: None   Collection Time: 06/24/22  4:32 PM   Specimen: Nasal Swab  Result Value Ref Range Status   SARS Coronavirus 2 by RT PCR NEGATIVE NEGATIVE Final    Comment: (NOTE) SARS-CoV-2 target nucleic acids are NOT DETECTED.  The SARS-CoV-2 RNA is generally detectable in upper and lower respiratory specimens during the acute phase of infection. The  lowest concentration of SARS-CoV-2 viral copies this assay can detect is 250 copies / mL. A negative result does not preclude SARS-CoV-2 infection and should not be used as the sole basis for treatment or other patient management decisions.  A negative result may occur with improper specimen collection / handling, submission of specimen other than nasopharyngeal swab, presence of viral mutation(s) within the areas targeted by this assay, and inadequate number of viral copies (<250 copies / mL). A negative result must be combined with clinical observations, patient history, and epidemiological information.  Fact Sheet for Patients:   https://www.patel.info/  Fact Sheet for Healthcare Providers: https://hall.com/  This test is not yet approved or  cleared by the [place] FDA and has been authorized for detection and/or diagnosis of SARS-CoV-2 by FDA under an Emergency Use Authorization (EUA).  This EUA will remain in effect (meaning this test can be used) for the duration of the COVID-19 declaration under Section 564(b)(1) of the Act, 21 U.S.C. section 360bbb-3(b)(1), unless the authorization is terminated or revoked sooner.  Performed at [facility], [phone]  [address] [place], [place] [address]   MRSA Next Gen by PCR, Nasal     Status: None   Collection Time: 06/24/22  6:07 PM   Specimen: Nasal Mucosa; Nasal Swab  Result Value Ref Range Status   MRSA by PCR Next Gen NOT DETECTED NOT DETECTED Final    Comment: (NOTE) The GeneXpert MRSA Assay (FDA approved for NASAL specimens only), is one component of a comprehensive MRSA colonization surveillance program. It is not intended to diagnose MRSA infection nor to guide or monitor treatment for MRSA infections. Test performance is not FDA approved in patients less than 4 years old. Performed at [facility], [address] [place], [place] [address]      Labs: CBC: Recent Labs  Lab 06/24/22 1507 06/24/22 1741 06/25/22 0517 06/26/22 0452 06/27/22 0531  WBC 10.4 6.7 5.2 3.5* 4.3  HGB 15.6 14.8 13.9 12.2* 13.1  HCT 41.9 38.8* 37.9* 33.2* 36.6*  MCV 94.4 93.3 96.9 96.0 97.9  PLT 261 214 217 211 [id number]   Basic Metabolic Panel: Recent Labs  Lab 06/24/22 1507 06/24/22 1741 06/24/22 1827 06/24/22 2112 06/25/22 0246 06/25/22 0517 06/25/22 0705 06/25/22 1305 06/25/22 1448 06/26/22 0452 06/26/22 1140 06/26/22 2125 06/27/22 0119 06/27/22 0531 06/27/22 0815 06/27/22 1214  NA 115* 112*  --    < > 126* 126*   < > 122*   < > 124*   < > 133* 134* 133* 131* 133*  K 2.2*  --  2.7*   < > 3.3* 3.2*  --  3.5  --  3.7  --   --   --  3.6  --   --   CL 77*  --   --   --   --  90*  --   --   --  92*  --   --   --  99  --   --   CO2 20*  --   --   --   --  28  --   --   --  27  --   --   --  26  --   --   GLUCOSE 151*  --   --   --   --  112*  --   --   --  101*  --   --   --  104*  --   --   BUN <5*  --   --   --   --  <5*  --   --   --  <5*  --   --   --  <5*  --   --   CREATININE 0.97  --   --   --   --  0.88  --   --   --  0.70  --   --   --  0.81  --   --   CALCIUM 9.3  --   --   --   --  8.3*  --   --   --  8.5*  --   --   --  8.8*  --   --   MG  --   --  2.0  --   --  2.1  --  1.9  --  1.7  --   --   --  2.0  --   --   PHOS  --  1.7*  --   --   --  3.3  --  3.0  --  3.3  --   --   --  5.1*  --   --    < > = values in this interval not displayed.   Liver Function Tests: Recent Labs  Lab 06/24/22 1507 06/25/22 0517 06/26/22 0452  AST 106* 71* 151*  ALT 44 33 31  ALKPHOS 80 54 54  BILITOT 1.4* 1.0 0.8  PROT 7.7 6.0* 5.5*  ALBUMIN 4.4 3.3* 3.1*   CBG: Recent Labs  Lab 06/25/22 2316 06/26/22 0317 06/26/22 0737 06/26/22 0800 06/26/22 1140  GLUCAP 108* 115* 98 116* 102*    Discharge time spent: greater than 30 minutes.  This record has been created using Systems analyst. Errors have been sought and  corrected,but may not always be located. Such creation errors do not reflect on the standard of care.   Signed: Lorella Nimrod, MD [facility] 06/27/2022

## 2022-06-27 NOTE — Hospital Course (Signed)
Taken from prior notes.  46 years old male with history of EtOH abuse and alcoholic hepatitis presented in the ED with c/o: dizziness, slurred speech and chest tightness and cramping of the hands.  Patient's wife reports the slurred speech started today in the afternoon and patient has not been acting like himself.  Patient drinks regularly.  He also endorses he started vaping 3 to 4 months ago.  Patient has  witnessed tonic-clonic seizures in the ER . He was confused afterwards. Patient has received 2 mg of IV Ativan.  Wife denies any prior history of seizures.  ED course.  Patient was found to have sodium of 110, potassium of 2.2, anion gap 18.  UDS was negative. CT head was negative for any acute abnormality.  Patient was admitted for hyponatremia and nephrology was consulted. Patient received 2 units of DD VAP and was started on hypertonic saline.  Hyponatremia was thought to be due to poor p.o. intake and excessive alcohol use.  Slowly corrected up to 133 appropriately. Patient started eating and drinking and appears to be at his baseline.  He was counseled extensively against alcohol abuse and the importance of eating healthy.  Seizure-like activity most likely secondary to acute hyponatremia versus alcohol withdrawal.  EEG was negative for any seizures.  Patient does not need any antiseizure medications at this time.  Electrolyte abnormalities were repleted.  Patient will continue on current medication and need to have a close follow-up with his PCP for further recommendations. He will need continuation of counseling for alcohol abuse.

## 2022-06-27 NOTE — Progress Notes (Signed)
Initial Nutrition Assessment  DOCUMENTATION CODES:   Severe malnutrition in context of chronic illness  INTERVENTION:   -Ensure Enlive po TID, each supplement provides 350 kcal and 20 grams of protein -MVI with minerals daily  NUTRITION DIAGNOSIS:   Severe Malnutrition related to chronic illness (hepatitis) as evidenced by moderate fat depletion, severe fat depletion, moderate muscle depletion, severe muscle depletion.  GOAL:   Patient will meet greater than or equal to 90% of their needs  MONITOR:   PO intake, Supplement acceptance  REASON FOR ASSESSMENT:   Malnutrition Screening Tool    ASSESSMENT:   Pt with history of EtOH abuse and alcoholic hepatitis presented with c/o: dizziness, slurred speech and chest tightness and cramping of the hands.  Pt admitted with acute hypovolemic hyponatremia secondary to ETOH abuse and acute metabolic encephalopathy.   Reviewed I/O's: -4.6 L x 24 hours and -6.6 L since admission  UOP: 5.9 L x 24 hours  Pt sleeping soundly at time of visit. He did not arouse to voice or touch. No family at bedside.   Pt currently on a regular diet. Noted meal completions 100%.   Reviewed wt hx; pt has experienced a 4.4% wt loss over the past 9 months, which is not significant for time frame.   Pt with malnutrition and increased nutritional needs for chronic illness and would benefit from addition of oral nutrition supplements.   Medications reviewed and include folic acid and thiamine.   Labs reviewed: Na: 133, Phos: 5.1, CBGS: 102 (inpatient orders for glycemic control are none).    NUTRITION - FOCUSED PHYSICAL EXAM:  Flowsheet Row Most Recent Value  Orbital Region Severe depletion  Upper Arm Region Moderate depletion  Buccal Region Moderate depletion  Temple Region Severe depletion  Clavicle Bone Region Severe depletion  Clavicle and Acromion Bone Region Severe depletion  Scapular Bone Region Severe depletion  Dorsal Hand Moderate  depletion  Patellar Region Moderate depletion  Anterior Thigh Region Moderate depletion  Posterior Calf Region Moderate depletion  Edema (RD Assessment) None  Hair Reviewed  Eyes Reviewed  Mouth Reviewed  Skin Reviewed  Nails Reviewed       Diet Order:   Diet Order             Diet - low sodium heart healthy           Diet regular Room service appropriate? Yes; Fluid consistency: Thin  Diet effective now                   EDUCATION NEEDS:   No education needs have been identified at this time  Skin:  Skin Assessment: Reviewed RN Assessment  Last BM:  06/27/23 (type 4)  Height:   Ht Readings from Last 1 Encounters:  06/26/22 (P) 6' (1.829 m)    Weight:   Wt Readings from Last 1 Encounters:  06/27/22 74.3 kg    Ideal Body Weight:  80.9 kg  BMI:  Body mass index is 22.22 kg/m (pended).  Estimated Nutritional Needs:   Kcal:  2400-2600  Protein:  125-150 grams  Fluid:  > 2 L    Levada Schilling, RD, LDN, CDCES Registered Dietitian II Certified Diabetes Care and Education Specialist Please refer to [facility] for RD and/or RD on-call/weekend/after hours pager

## 2022-07-17 ENCOUNTER — Ambulatory Visit: Payer: Self-pay | Admitting: Nurse Practitioner

## 2022-07-20 ENCOUNTER — Other Ambulatory Visit: Payer: Self-pay

## 2022-07-27 ENCOUNTER — Ambulatory Visit: Payer: BLUE CROSS/BLUE SHIELD | Admitting: Nurse Practitioner

## 2022-08-21 ENCOUNTER — Ambulatory Visit: Payer: Self-pay | Admitting: Nurse Practitioner

## 2023-06-05 ENCOUNTER — Encounter (HOSPITAL_COMMUNITY): Payer: Self-pay

## 2023-06-05 ENCOUNTER — Emergency Department (HOSPITAL_COMMUNITY)
Admission: EM | Admit: 2023-06-05 | Discharge: 2023-06-05 | Disposition: A | Discharge status: Home / Self Care | Payer: 59 | Attending: Emergency Medicine | Admitting: Emergency Medicine

## 2023-06-05 ENCOUNTER — Other Ambulatory Visit: Payer: Self-pay

## 2023-06-05 ENCOUNTER — Emergency Department (HOSPITAL_COMMUNITY): Payer: 59

## 2023-06-05 DIAGNOSIS — R569 Unspecified convulsions: Secondary | ICD-10-CM | POA: Diagnosis present

## 2023-06-05 DIAGNOSIS — R748 Abnormal levels of other serum enzymes: Secondary | ICD-10-CM | POA: Insufficient documentation

## 2023-06-05 LAB — CBC WITH DIFFERENTIAL/PLATELET
Abs Immature Granulocytes: 0.03 10*3/uL (ref 0.00–0.07)
Basophils Absolute: 0 10*3/uL (ref 0.0–0.1)
Basophils Relative: 0 %
Eosinophils Absolute: 0 10*3/uL (ref 0.0–0.5)
Eosinophils Relative: 0 %
HCT: 42.6 % (ref 39.0–52.0)
Hemoglobin: 15.9 g/dL (ref 13.0–17.0)
Immature Granulocytes: 0 %
Lymphocytes Relative: 8 %
Lymphs Abs: 0.6 10*3/uL — ABNORMAL LOW (ref 0.7–4.0)
MCH: 36.9 pg — ABNORMAL HIGH (ref 26.0–34.0)
MCHC: 37.3 g/dL — ABNORMAL HIGH (ref 30.0–36.0)
MCV: 98.8 fL (ref 80.0–100.0)
Monocytes Absolute: 0.5 10*3/uL (ref 0.1–1.0)
Monocytes Relative: 8 %
Neutro Abs: 5.7 10*3/uL (ref 1.7–7.7)
Neutrophils Relative %: 84 %
Platelets: 194 10*3/uL (ref 150–400)
RBC: 4.31 MIL/uL (ref 4.22–5.81)
RDW: 11 % — ABNORMAL LOW (ref 11.5–15.5)
WBC: 6.8 10*3/uL (ref 4.0–10.5)
nRBC: 0 % (ref 0.0–0.2)

## 2023-06-05 LAB — COMPREHENSIVE METABOLIC PANEL
ALT: 74 U/L — ABNORMAL HIGH (ref 0–44)
AST: 295 U/L — ABNORMAL HIGH (ref 15–41)
Albumin: 4 g/dL (ref 3.5–5.0)
Alkaline Phosphatase: 120 U/L (ref 38–126)
Anion gap: 28 — ABNORMAL HIGH (ref 5–15)
BUN: <5 mg/dL — ABNORMAL LOW (ref 6–20)
CO2: 22 mmol/L (ref 22–32)
Calcium: 9 mg/dL (ref 8.9–10.3)
Chloride: 80 mmol/L — ABNORMAL LOW (ref 98–111)
Creatinine, Ser: 0.93 mg/dL (ref 0.61–1.24)
GFR, Estimated: >60 mL/min (ref >60)
Glucose, Bld: 146 mg/dL — ABNORMAL HIGH (ref 70–99)
Potassium: 2.6 mmol/L — CL (ref 3.5–5.1)
Sodium: 131 mmol/L — ABNORMAL LOW (ref 135–145)
Total Bilirubin: 5.3 mg/dL — ABNORMAL HIGH (ref 0.3–1.2)
Total Protein: 7.3 g/dL (ref 6.5–8.1)

## 2023-06-05 LAB — RAPID URINE DRUG SCREEN, HOSP PERFORMED
Amphetamines: NOT DETECTED
Barbiturates: NOT DETECTED
Benzodiazepines: NOT DETECTED
Cocaine: NOT DETECTED
Opiates: NOT DETECTED
Tetrahydrocannabinol: NOT DETECTED

## 2023-06-05 LAB — BASIC METABOLIC PANEL
Anion gap: 10 (ref 5–15)
BUN: <5 mg/dL — ABNORMAL LOW (ref 6–20)
CO2: 30 mmol/L (ref 22–32)
Calcium: 7.7 mg/dL — ABNORMAL LOW (ref 8.9–10.3)
Chloride: 93 mmol/L — ABNORMAL LOW (ref 98–111)
Creatinine, Ser: 0.78 mg/dL (ref 0.61–1.24)
GFR, Estimated: >60 mL/min (ref >60)
Glucose, Bld: 96 mg/dL (ref 70–99)
Potassium: 3.1 mmol/L — ABNORMAL LOW (ref 3.5–5.1)
Sodium: 133 mmol/L — ABNORMAL LOW (ref 135–145)

## 2023-06-05 LAB — ETHANOL: Alcohol, Ethyl (B): <10 mg/dL (ref <10)

## 2023-06-05 MED ORDER — CHLORDIAZEPOXIDE HCL 25 MG PO CAPS: ORAL_CAPSULE | ORAL | 0 refills | Status: DC

## 2023-06-05 MED ORDER — SODIUM CHLORIDE 0.9 % IV BOLUS
1000.0000 mL | Freq: Once | INTRAVENOUS | Status: AC
  Administered 2023-06-05: 1000 mL via INTRAVENOUS

## 2023-06-05 MED ORDER — POTASSIUM CHLORIDE 20 MEQ PO PACK
40.0000 meq | PACK | Freq: Once | ORAL | Status: AC
  Administered 2023-06-05: 40 meq via ORAL
  Filled 2023-06-05: qty 2

## 2023-06-05 MED ORDER — POTASSIUM CHLORIDE 10 MEQ/100ML IV SOLN
10.0000 meq | Freq: Once | INTRAVENOUS | Status: AC
  Administered 2023-06-05: 10 meq via INTRAVENOUS
  Filled 2023-06-05: qty 100

## 2023-06-05 MED ORDER — SODIUM CHLORIDE 0.9 % IV BOLUS
500.0000 mL | Freq: Once | INTRAVENOUS | Status: AC
  Administered 2023-06-05: 500 mL via INTRAVENOUS

## 2023-06-05 NOTE — ED Provider Notes (Signed)
[place] EMERGENCY DEPARTMENT AT [facility] Provider Note   CSN: [id number] Arrival date & time: 06/05/23  1518     History  Chief Complaint  Patient presents with   Seizures    Nicholas Fletcher is a 47 y.o. male.  Patient with a significant history of EtOH abuse.  Patient had a seizure today.  Patient does not take his seizure medicine  The history is provided by the patient and medical records.  Seizures Seizure activity on arrival: no   Seizure type:  Unable to specify Preceding symptoms: no sensation of an aura present   Initial focality:  None Episode characteristics: confusion   Postictal symptoms: confusion   Return to baseline: yes   Severity:  Moderate Timing:  Once Progression:  Resolved Context: not alcohol withdrawal        Home Medications Prior to Admission medications   Medication Sig Start Date End Date Taking? Authorizing Provider  chlordiazePOXIDE (LIBRIUM) 25 MG capsule 50mg  PO TID x 1D, then 25-50mg  PO BID X 1D, then 25-50mg  PO QD X 1D 06/05/23  Yes Bethann Berkshire, MD  chlorproMAZINE (THORAZINE) 25 MG tablet Take 1 tablet (25 mg total) by mouth 3 (three) times daily as needed for hiccoughs. 06/27/22   Arnetha Courser, MD  folic acid (FOLVITE) 1 MG tablet Take 1 tablet (1 mg total) by mouth daily. 06/28/22   Arnetha Courser, MD  omeprazole (PRILOSEC) 20 MG capsule Take 20 mg by mouth daily.    [provider]      Allergies    Patient has no known allergies.    Review of Systems   Review of Systems  Constitutional:  Negative for appetite change and fatigue.  HENT:  Negative for congestion, ear discharge and sinus pressure.   Eyes:  Negative for discharge.  Respiratory:  Negative for cough.   Cardiovascular:  Negative for chest pain.  Gastrointestinal:  Negative for abdominal pain and diarrhea.  Genitourinary:  Negative for frequency and hematuria.  Musculoskeletal:  Negative for back pain.  Skin:  Negative for rash.   Neurological:  Positive for seizures. Negative for headaches.  Psychiatric/Behavioral:  Negative for hallucinations.     Physical Exam Updated Vital Signs BP 135/79   Pulse 88   Temp 99.1 F (37.3 C) (Oral)   Resp 18   Ht 5\' 11"  (1.803 m)   Wt 74.8 kg   SpO2 98%   BMI 23.01 kg/m  Physical Exam Vitals and nursing note reviewed.  Constitutional:      Appearance: He is well-developed.  HENT:     Head: Normocephalic.     Nose: Nose normal.  Eyes:     General: No scleral icterus.    Conjunctiva/sclera: Conjunctivae normal.  Neck:     Thyroid: No thyromegaly.  Cardiovascular:     Rate and Rhythm: Normal rate and regular rhythm.     Heart sounds: No murmur heard.    No friction rub. No gallop.  Pulmonary:     Breath sounds: No stridor. No wheezing or rales.  Chest:     Chest wall: No tenderness.  Abdominal:     General: There is no distension.     Tenderness: There is no abdominal tenderness. There is no rebound.  Musculoskeletal:        General: Normal range of motion.     Cervical back: Neck supple.  Lymphadenopathy:     Cervical: No cervical adenopathy.  Skin:    Findings: No erythema or  rash.  Neurological:     Mental Status: He is alert and oriented to person, place, and time.     Motor: No abnormal muscle tone.     Coordination: Coordination normal.  Psychiatric:        Behavior: Behavior normal.     ED Results / Procedures / Treatments   Labs (all labs ordered are listed, but only abnormal results are displayed) Labs Reviewed  CBC WITH DIFFERENTIAL/PLATELET - Abnormal; Notable for the following components:      Result Value   MCH 36.9 (*)    MCHC 37.3 (*)    RDW 11.0 (*)    Lymphs Abs 0.6 (*)    All other components within normal limits  COMPREHENSIVE METABOLIC PANEL - Abnormal; Notable for the following components:   Sodium 131 (*)    Potassium 2.6 (*)    Chloride 80 (*)    Glucose, Bld 146 (*)    BUN <5 (*)    AST 295 (*)    ALT 74 (*)     Total Bilirubin 5.3 (*)    Anion gap 28 (*)    All other components within normal limits  BASIC METABOLIC PANEL - Abnormal; Notable for the following components:   Sodium 133 (*)    Potassium 3.1 (*)    Chloride 93 (*)    BUN <5 (*)    Calcium 7.7 (*)    All other components within normal limits  ETHANOL  RAPID URINE DRUG SCREEN, HOSP PERFORMED    EKG None  Radiology CT Head Wo Contrast  Result Date: 06/05/2023 CLINICAL DATA:  Provided history: Seizure, new onset, no history of trauma. EXAM: CT HEAD WITHOUT CONTRAST TECHNIQUE: Contiguous axial images were obtained from the base of the skull through the vertex without intravenous contrast. RADIATION DOSE REDUCTION: This exam was performed according to the departmental dose-optimization program which includes automated exposure control, adjustment of the mA and/or kV according to patient size and/or use of iterative reconstruction technique. COMPARISON:  Head CT 06/24/2022. FINDINGS: Brain: Generalized parenchymal atrophy, mild but greater than expected for age. There is no acute intracranial hemorrhage. No demarcated cortical infarct. No extra-axial fluid collection. No evidence of an intracranial mass. No midline shift. Vascular: No hyperdense vessel. Skull: No calvarial fracture or aggressive osseous lesion. Sinuses/Orbits: No mass or acute finding within the imaged orbits. Trace mucosal thickening within the right ethmoid and bilateral sphenoid sinuses. IMPRESSION: 1. No evidence of an acute intracranial abnormality. 2. Generalized parenchymal atrophy, mild but greater than expected for age. Electronically Signed   By: Jackey Loge D.O.   On: 06/05/2023 17:04    Procedures Procedures    Medications Ordered in ED Medications  sodium chloride 0.9 % bolus 500 mL (0 mLs Intravenous Stopped 06/05/23 1913)  potassium chloride 10 mEq in 100 mL IVPB (0 mEq Intravenous Stopped 06/05/23 1913)  potassium chloride (KLOR-CON) packet 40 mEq (40 mEq  Oral Given 06/05/23 1759)  sodium chloride 0.9 % bolus 1,000 mL (0 mLs Intravenous Stopped 06/05/23 2134)    ED Course/ Medical Decision Making/ A&P                                 Medical Decision Making Amount and/or Complexity of Data Reviewed Labs: ordered. Radiology: ordered.  Risk Prescription drug management.   Patient with a seizure secondary to EtOH abuse.  He also has significantly elevated liver enzymes.  Patient is  stable and discharged home and is referred to GI and neurology.  He is instructed not to drink any more alcohol and he has been given a Librium taper        Final Clinical Impression(s) / ED Diagnoses Final diagnoses:  Seizure (HCC)  Elevated liver enzymes    Rx / DC Orders ED Discharge Orders          Ordered    chlordiazePOXIDE (LIBRIUM) 25 MG capsule        06/05/23 2206              Bethann Berkshire, MD 06/07/23 1059

## 2023-06-05 NOTE — ED Triage Notes (Signed)
Pt bib [facility], pt was visiting his parents in the nursing home. The last thing the pt remembers was sitting on the bench outside with his family. Family states pt had a seizure. Pt states he had a seizure about 1-2 years ago. Denies pain at this time. Pt wife thinks the seizure is alcohol induced, pt states he has usually takes 3-4 shots of vodka a day, last drink was yesterday.

## 2023-06-05 NOTE — Discharge Instructions (Signed)
Stop drinking alcohol altogether.  Follow-up with Dr. Jena Gauss or one of his colleagues in the next couple weeks for your elevated liver enzymes.  Follow-up with [facility] neurology in the next month for your seizure.  Do not do any driving until the neurologist states it is okay.  Return if any problems

## 2023-06-07 NOTE — ED Notes (Signed)
Patient wife called and stated needed another neuro consult. Stated their insurance did not cover [facility]. New note done and faxed to [facility] per pt wife request [phone]

## 2023-07-04 ENCOUNTER — Emergency Department (HOSPITAL_COMMUNITY)
Admission: EM | Admit: 2023-07-04 | Discharge: 2023-07-05 | Disposition: A | Discharge status: Other Acute Inpatient Hospital | Payer: No Typology Code available for payment source | Attending: Emergency Medicine | Admitting: Emergency Medicine

## 2023-07-04 ENCOUNTER — Other Ambulatory Visit: Payer: Self-pay

## 2023-07-04 DIAGNOSIS — R443 Hallucinations, unspecified: Secondary | ICD-10-CM | POA: Insufficient documentation

## 2023-07-04 DIAGNOSIS — F1020 Alcohol dependence, uncomplicated: Secondary | ICD-10-CM | POA: Diagnosis present

## 2023-07-04 DIAGNOSIS — R456 Violent behavior: Secondary | ICD-10-CM | POA: Insufficient documentation

## 2023-07-04 DIAGNOSIS — R4689 Other symptoms and signs involving appearance and behavior: Secondary | ICD-10-CM

## 2023-07-04 DIAGNOSIS — I10 Essential (primary) hypertension: Secondary | ICD-10-CM

## 2023-07-04 DIAGNOSIS — Y907 Blood alcohol level of 200-239 mg/100 ml: Secondary | ICD-10-CM | POA: Insufficient documentation

## 2023-07-04 LAB — CBC
HCT: 42.8 % (ref 39.0–52.0)
Hemoglobin: 14.7 g/dL (ref 13.0–17.0)
MCH: 34.9 pg — ABNORMAL HIGH (ref 26.0–34.0)
MCHC: 34.3 g/dL (ref 30.0–36.0)
MCV: 101.7 fL — ABNORMAL HIGH (ref 80.0–100.0)
Platelets: 281 10*3/uL (ref 150–400)
RBC: 4.21 MIL/uL — ABNORMAL LOW (ref 4.22–5.81)
RDW: 11.8 % (ref 11.5–15.5)
WBC: 7.2 10*3/uL (ref 4.0–10.5)
nRBC: 0 % (ref 0.0–0.2)

## 2023-07-04 MED ORDER — LORAZEPAM 2 MG/ML IJ SOLN: 0.0000 mg | Freq: Two times a day (BID) | INTRAMUSCULAR | Status: DC

## 2023-07-04 MED ORDER — THIAMINE HCL 100 MG/ML IJ SOLN: 100.0000 mg | Freq: Every day | INTRAMUSCULAR | Status: DC

## 2023-07-04 MED ORDER — LORAZEPAM 1 MG PO TABS
0.0000 mg | ORAL_TABLET | Freq: Four times a day (QID) | ORAL | Status: DC
  Administered 2023-07-05: 2 mg via ORAL
  Filled 2023-07-04: qty 2

## 2023-07-04 MED ORDER — LORAZEPAM 1 MG PO TABS: 0.0000 mg | ORAL_TABLET | Freq: Two times a day (BID) | ORAL | Status: DC

## 2023-07-04 MED ORDER — THIAMINE MONONITRATE 100 MG PO TABS
100.0000 mg | ORAL_TABLET | Freq: Every day | ORAL | Status: DC
  Administered 2023-07-05: 100 mg via ORAL
  Filled 2023-07-04: qty 1

## 2023-07-04 MED ORDER — LORAZEPAM 2 MG/ML IJ SOLN: 0.0000 mg | Freq: Four times a day (QID) | INTRAMUSCULAR | Status: DC
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
## 2023-07-04 NOTE — ED Notes (Signed)
Labeled specimen container given to pt for U/A collection per MD order. [employer]
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
## 2023-07-05 ENCOUNTER — Inpatient Hospital Stay (HOSPITAL_COMMUNITY)
Admission: AD | Admit: 2023-07-05 | Discharge: 2023-07-11 | DRG: 897 | Disposition: A | Discharge status: Home / Self Care | Payer: No Typology Code available for payment source | Source: Intra-hospital | Attending: Psychiatry | Admitting: Psychiatry

## 2023-07-05 DIAGNOSIS — F419 Anxiety disorder, unspecified: Secondary | ICD-10-CM | POA: Diagnosis present

## 2023-07-05 DIAGNOSIS — Z79899 Other long term (current) drug therapy: Secondary | ICD-10-CM

## 2023-07-05 DIAGNOSIS — F411 Generalized anxiety disorder: Secondary | ICD-10-CM | POA: Diagnosis present

## 2023-07-05 DIAGNOSIS — F32A Depression, unspecified: Secondary | ICD-10-CM | POA: Diagnosis present

## 2023-07-05 DIAGNOSIS — F1090 Alcohol use, unspecified, uncomplicated: Secondary | ICD-10-CM | POA: Diagnosis not present

## 2023-07-05 DIAGNOSIS — K5900 Constipation, unspecified: Secondary | ICD-10-CM | POA: Diagnosis present

## 2023-07-05 DIAGNOSIS — G4700 Insomnia, unspecified: Secondary | ICD-10-CM | POA: Diagnosis present

## 2023-07-05 DIAGNOSIS — F1020 Alcohol dependence, uncomplicated: Secondary | ICD-10-CM | POA: Diagnosis present

## 2023-07-05 DIAGNOSIS — F17290 Nicotine dependence, other tobacco product, uncomplicated: Secondary | ICD-10-CM | POA: Diagnosis present

## 2023-07-05 DIAGNOSIS — F10229 Alcohol dependence with intoxication, unspecified: Secondary | ICD-10-CM | POA: Diagnosis present

## 2023-07-05 DIAGNOSIS — Y907 Blood alcohol level of 200-239 mg/100 ml: Secondary | ICD-10-CM | POA: Diagnosis present

## 2023-07-05 HISTORY — DX: Unspecified convulsions: R56.9

## 2023-07-05 HISTORY — DX: Other specified health status: Z78.9

## 2023-07-05 LAB — COMPREHENSIVE METABOLIC PANEL
ALT: 19 U/L (ref 0–44)
AST: 66 U/L — ABNORMAL HIGH (ref 15–41)
Albumin: 4.2 g/dL (ref 3.5–5.0)
Alkaline Phosphatase: 87 U/L (ref 38–126)
Anion gap: 17 — ABNORMAL HIGH (ref 5–15)
BUN: 7 mg/dL (ref 6–20)
CO2: 22 mmol/L (ref 22–32)
Calcium: 9 mg/dL (ref 8.9–10.3)
Chloride: 104 mmol/L (ref 98–111)
Creatinine, Ser: 0.82 mg/dL (ref 0.61–1.24)
GFR, Estimated: >60 mL/min (ref >60)
Glucose, Bld: 85 mg/dL (ref 70–99)
Potassium: 3.2 mmol/L — ABNORMAL LOW (ref 3.5–5.1)
Sodium: 143 mmol/L (ref 135–145)
Total Bilirubin: 1.3 mg/dL — ABNORMAL HIGH (ref 0.3–1.2)
Total Protein: 7.8 g/dL (ref 6.5–8.1)

## 2023-07-05 LAB — ACETAMINOPHEN LEVEL: Acetaminophen (Tylenol), Serum: <10 ug/mL — ABNORMAL LOW (ref 10–30)

## 2023-07-05 LAB — RAPID URINE DRUG SCREEN, HOSP PERFORMED
Amphetamines: NOT DETECTED
Barbiturates: NOT DETECTED
Benzodiazepines: POSITIVE — AB
Cocaine: NOT DETECTED
Opiates: NOT DETECTED
Tetrahydrocannabinol: NOT DETECTED

## 2023-07-05 LAB — SALICYLATE LEVEL: Salicylate Lvl: <7 mg/dL — ABNORMAL LOW (ref 7.0–30.0)

## 2023-07-05 LAB — ETHANOL: Alcohol, Ethyl (B): 238 mg/dL — ABNORMAL HIGH (ref <10)

## 2023-07-05 MED ORDER — POTASSIUM CHLORIDE CRYS ER 20 MEQ PO TBCR: 40.0000 meq | EXTENDED_RELEASE_TABLET | Freq: Once | ORAL | Status: DC

## 2023-07-05 MED ORDER — ACETAMINOPHEN 500 MG PO TABS
1000.0000 mg | ORAL_TABLET | Freq: Two times a day (BID) | ORAL | Status: DC
  Administered 2023-07-05 (×2): 1000 mg via ORAL
  Filled 2023-07-05 (×2): qty 2

## 2023-07-05 MED ORDER — ALUM & MAG HYDROXIDE-SIMETH 200-200-20 MG/5ML PO SUSP
30.0000 mL | Freq: Once | ORAL | Status: AC
  Administered 2023-07-05: 30 mL via ORAL
  Filled 2023-07-05: qty 30

## 2023-07-05 MED ORDER — AMLODIPINE BESYLATE 5 MG PO TABS
5.0000 mg | ORAL_TABLET | Freq: Once | ORAL | Status: AC
  Administered 2023-07-05: 5 mg via ORAL
  Filled 2023-07-05: qty 1

## 2023-07-05 NOTE — BH Assessment (Addendum)
This clinician contacted [facility] and spoke with Sharyon Cable, who will coordinate the [facility] provider to complete the teleassessment.

## 2023-07-05 NOTE — Progress Notes (Signed)
Pt was accepted to [facility] TODAY 07/05/2023; Bed Assignment 300-1  Pt meets inpatient criteria per Earney Navy, NP-PMHNP-BC   Attending Physician will be Dr. Phineas Inches, MD   Report can be called to: -Adult unit: [phone]  Pt can arrive after 2200  Care Team notified: [facility] Lona Kettle, Junior Cesar Rimando,RN, Adela Lank [place], [place] Hendra,LCSW, [place], Ene [place],NP, [facility] [place], [place] 07/05/2023 @ 8:53 PM
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
## 2023-07-05 NOTE — Progress Notes (Signed)
Report received at 2038  from Montez Hageman RN, writer was in middle of passing 2000 medications due at time called previously

## 2023-07-05 NOTE — ED Notes (Signed)
Attempted to call for report. Accepting RN busy at this time.Will call her again later.

## 2023-07-05 NOTE — Consult Note (Signed)
BH ED ASSESSMENT   Reason for Consult:  Psychiatry evaluation Referring Physician:  ER Physician Patient Identification: Nicholas Fletcher MRN:  [id number] ED Chief Complaint: Alcohol use disorder, severe, dependence (HCC)  Diagnosis:  Principal Problem:   Alcohol use disorder, severe, dependence (HCC)   ED Assessment Time Calculation: Start Time: 1349 Stop Time: 1423 Total Time in Minutes (Assessment Completion): 34   Subjective:   Nicholas Fletcher is a 47 y.o. male patient admitted with aggressive behavior possibly alcohol intoxication.  Brought in by [employer] under IVC taken out by his wife. Patient has long hx of alcoholism. Marland Kitchen  HPI:  Patient was seen this morning awake, alert and oriented x4.  Patient admitted he has been an alcoholic for a while and that he has been sober for 18 days and yesterday he had "little drink" with his friends.  Patient states he had al altercation with his wife and she went to the court and secured IVC.  Patient also admits that he has been informed that his long term use of Alcohol is affecting his liver and his over all health.  Patient states that he received his new insurance yesterday and plans to start taking care of his health.  Patient states  he was diagnosed with anxiety two years ago and was placed on Hydroxyzine.  He stopped taking it after taking four doses because he was having Hallucinating after taking hydroxyzine.  Patient reports that he is depressed due to he and his wife not getting along.  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  Patient reports poor sleep and appetite. Patient reports that he has been feeling depressed but did not seek help.  He rated depression 6-7/10 with 10 being severe depression.  Patient denies SI/HI/AVH and denies previous or family hx of suicide.  Alcohol level on arrival was 238, UDS was positive for Benzodiazepine.  Patient denies  previous mental health care and no previous inpatient Psychiatric hospitalization.  AST/ALT are elevated. Collateral information from his wife, MS Bladen Bevilacqua is that her husband has been an alcoholic and he has been warned by doctors that if he does not stop he is going to die.  Patient has had series of seizures that appears to be related to Alcohol withdrawal.  Patient was has been hospitalized for [facility] at [facility].  Patient was sober from 06/05/23 till yesterday he resumed drinking. Patient meets criteria for inpatient Psychiatry hospitalization for safety and stabilization.  Patient is on CIWA Protocol using Ativan and we will fax out records for bed placement.  Past Psychiatric History: Anxiety, Alcohol abuse  Risk to Self or Others: Is the patient at risk to self? No Has the patient been a risk to self in the past 6 months? No Has the patient been a risk to self within the distant past? No Is the patient a risk to others? No Has the patient been a risk to others in the past 6 months? No Has the patient been a risk to others within the distant past? No  [place] Scale:  Flowsheet Row ED from 07/04/2023 in [facility] Department at [facility] ED from 06/05/2023 in [facility] Emergency Department at [facility] ED to Hosp-Admission (Discharged) from 06/24/2022 in [facility] REGIONAL CARDIAC MED PCU  C-SSRS RISK CATEGORY No Risk No Risk No Risk  AIMS:  , , ,  ,   ASAM:    Substance Abuse:     Past Medical History: No past medical history on file.  Past Surgical History:  Procedure Laterality Date   COLONOSCOPY WITH PROPOFOL N/A 09/26/2021   Procedure: COLONOSCOPY WITH PROPOFOL;  Surgeon: Regis Bill, MD;  Location: [facility] ENDOSCOPY;  Service: Endoscopy;  Laterality: N/A;   COLONOSCOPY WITH PROPOFOL N/A 09/28/2021   Procedure: COLONOSCOPY WITH PROPOFOL;  Surgeon: Regis Bill, MD;  Location: [facility] ENDOSCOPY;   Service: Endoscopy;  Laterality: N/A;   ESOPHAGOGASTRODUODENOSCOPY (EGD) WITH PROPOFOL N/A 09/26/2021   Procedure: ESOPHAGOGASTRODUODENOSCOPY (EGD) WITH PROPOFOL;  Surgeon: Regis Bill, MD;  Location: [facility] ENDOSCOPY;  Service: Endoscopy;  Laterality: N/A;   TONSILLECTOMY     Family History:  Family History  Family history unknown: Yes   Family Psychiatric  History: One parent is alcoholic and the other a drug addict per his spouse Social History:  Social History   Substance and Sexual Activity  Alcohol Use Yes     Social History   Substance and Sexual Activity  Drug Use Not Currently    Social History   Socioeconomic History   Marital status: Married    Spouse name: Not on file   Number of children: Not on file   Years of education: Not on file   Highest education level: Not on file  Occupational History   Not on file  Tobacco Use   Smoking status: Light Smoker    Types: Cigars   Smokeless tobacco: Never  Substance and Sexual Activity   Alcohol use: Yes   Drug use: Not Currently   Sexual activity: Not on file  Other Topics Concern   Not on file  Social History Narrative   Not on file   Social Determinants of Health   Financial Resource Strain: Not on file  Food Insecurity: Low Risk  (06/11/2023)   Received from [facility]   Hunger Vital Sign    Worried About Running Out of Food in the Last Year: Never true    Ran Out of Food in the Last Year: Never true  Transportation Needs: Not on file (06/11/2023)  Physical Activity: Not on file  Stress: Not on file  Social Connections: Unknown (03/06/2022)   Received from [facility]   Social Network    Social Network: Not on file   Additional Social History:    Allergies:  No Known Allergies  Labs:  Results for orders placed or performed during the hospital encounter of 07/04/23 (from the past 48 hour(s))  Comprehensive metabolic panel     Status: Abnormal   Collection Time: 07/04/23  11:01 PM  Result Value Ref Range   Sodium 143 135 - 145 mmol/L   Potassium 3.2 (L) 3.5 - 5.1 mmol/L   Chloride 104 98 - 111 mmol/L   CO2 22 22 - 32 mmol/L   Glucose, Bld 85 70 - 99 mg/dL    Comment: Glucose reference range applies only to samples taken after fasting for at least 8 hours.   BUN 7 6 - 20 mg/dL   Creatinine, Ser 1.61 0.61 - 1.24 mg/dL   Calcium 9.0 8.9 - 09.6 mg/dL   Total Protein 7.8 6.5 - 8.1 g/dL   Albumin 4.2 3.5 - 5.0 g/dL   AST 66 (H) 15 - 41 U/L   ALT 19 0 - 44 U/L   Alkaline Phosphatase 87 38 - 126 U/L   Total Bilirubin  1.3 (H) 0.3 - 1.2 mg/dL   GFR, Estimated >95 >28 mL/min    Comment: (NOTE) Calculated using the CKD-EPI Creatinine Equation (2021)    Anion gap 17 (H) 5 - 15    Comment: Performed at [facility], [address], [place], [place] [address]  Ethanol     Status: Abnormal   Collection Time: 07/04/23 11:01 PM  Result Value Ref Range   Alcohol, Ethyl (B) 238 (H) <10 mg/dL    Comment: (NOTE) Lowest detectable limit for serum alcohol is 10 mg/dL.  For medical purposes only. Performed at [facility], [address], [place], [place] [address]   Salicylate level     Status: Abnormal   Collection Time: 07/04/23 11:01 PM  Result Value Ref Range   Salicylate Lvl <7.0 (L) 7.0 - 30.0 mg/dL    Comment: Performed at [facility], [address], [place], [place] [address]  Acetaminophen level     Status: Abnormal   Collection Time: 07/04/23 11:01 PM  Result Value Ref Range   Acetaminophen (Tylenol), Serum <10 (L) 10 - 30 ug/mL    Comment: (NOTE) Therapeutic concentrations vary significantly. A range of 10-30 ug/mL  may be an effective concentration for many patients. However, some  are best treated at concentrations outside of this range. Acetaminophen concentrations >150 ug/mL at 4 hours after ingestion  and >50 ug/mL at 12 hours after ingestion are often associated with  toxic  reactions.  Performed at [facility], [address] [place], [place] [address]   cbc     Status: Abnormal   Collection Time: 07/04/23 11:01 PM  Result Value Ref Range   WBC 7.2 4.0 - 10.5 K/uL   RBC 4.21 (L) 4.22 - 5.81 MIL/uL   Hemoglobin 14.7 13.0 - 17.0 g/dL   HCT 47.4 25.9 - 56.3 %   MCV 101.7 (H) 80.0 - 100.0 fL   MCH 34.9 (H) 26.0 - 34.0 pg   MCHC 34.3 30.0 - 36.0 g/dL   RDW 87.5 64.3 - 32.9 %   Platelets 281 150 - 400 K/uL   nRBC 0.0 0.0 - 0.2 %    Comment: Performed at [facility], [address] [place], [place] [address]  Rapid urine drug screen (hospital performed)     Status: Abnormal   Collection Time: 07/04/23 11:12 PM  Result Value Ref Range   Opiates NONE DETECTED NONE DETECTED   Cocaine NONE DETECTED NONE DETECTED   Benzodiazepines POSITIVE (A) NONE DETECTED   Amphetamines NONE DETECTED NONE DETECTED   Tetrahydrocannabinol NONE DETECTED NONE DETECTED   Barbiturates NONE DETECTED NONE DETECTED    Comment: (NOTE) DRUG SCREEN FOR MEDICAL PURPOSES ONLY.  IF CONFIRMATION IS NEEDED FOR ANY PURPOSE, NOTIFY LAB WITHIN 5 DAYS.  LOWEST DETECTABLE LIMITS FOR URINE DRUG SCREEN Drug Class                     Cutoff (ng/mL) Amphetamine and metabolites    1000 Barbiturate and metabolites    200 Benzodiazepine                 200 Opiates and metabolites        300 Cocaine and metabolites        300 THC                            50 Performed at [facility], [address], [place], [place] [address]     Current Facility-Administered Medications  Medication Dose Route Frequency Provider Last Rate Last Admin   acetaminophen (TYLENOL) tablet 1,000 mg  1,000 mg Oral BID Jacalyn Lefevre, MD   1,000 mg at 07/05/23 0758   LORazepam (ATIVAN) injection 0-4 mg  0-4 mg Intravenous Q6H Roxy Horseman, PA-C       Or   LORazepam (ATIVAN) tablet 0-4 mg  0-4 mg Oral Q6H Roxy Horseman, PA-C   2 mg at 07/05/23  0021   [START ON 07/07/2023] LORazepam (ATIVAN) injection 0-4 mg  0-4 mg Intravenous Q12H Roxy Horseman, PA-C       Or   [START ON 07/07/2023] LORazepam (ATIVAN) tablet 0-4 mg  0-4 mg Oral Q12H Roxy Horseman, PA-C       potassium chloride SA (KLOR-CON M) CR tablet 40 mEq  40 mEq Oral Once Roxy Horseman, PA-C       thiamine (VITAMIN B1) tablet 100 mg  100 mg Oral Daily Roxy Horseman, PA-C   100 mg at 07/05/23 0981   Or   thiamine (VITAMIN B1) injection 100 mg  100 mg Intravenous Daily Roxy Horseman, PA-C       Current Outpatient Medications  Medication Sig Dispense Refill   ibuprofen (ADVIL) 600 MG tablet Take 600 mg by mouth every 6 (six) hours as needed for headache.     omeprazole (PRILOSEC) 20 MG capsule Take 20 mg by mouth daily.      Musculoskeletal: Strength & Muscle Tone: within normal limits Gait & Station: normal Patient leans: Front   Psychiatric Specialty Exam: Presentation  General Appearance:  Casual; Neat  Eye Contact: Good  Speech: Clear and Coherent; Normal Rate  Speech Volume: Normal  Handedness: Right   Mood and Affect  Mood: Anxious; Depressed  Affect: Congruent; Depressed   Thought Process  Thought Processes: Coherent; Goal Directed  Descriptions of Associations:Intact  Orientation:Full (Time, Place and Person)  Thought Content:Logical  History of Schizophrenia/Schizoaffective disorder:No data recorded Duration of Psychotic Symptoms:No data recorded Hallucinations:Hallucinations: None  Ideas of Reference:None  Suicidal Thoughts:Suicidal Thoughts: No  Homicidal Thoughts:Homicidal Thoughts: No   Sensorium  Memory: Immediate Good; Recent Good; Remote Good  Judgment: Fair  Insight: Poor   Executive Functions  Concentration: Good  Attention Span: Good  Recall: Good  Fund of Knowledge: Good  Language: Good   Psychomotor Activity  Psychomotor Activity: Psychomotor Activity: Normal   Assets   Assets: Communication Skills; Housing; Intimacy    Sleep  Sleep: Sleep: Fair   Physical Exam: Physical Exam Vitals and nursing note reviewed.  HENT:     Head: Normocephalic.     Nose: Nose normal.  Cardiovascular:     Rate and Rhythm: Normal rate and regular rhythm.  Pulmonary:     Effort: Pulmonary effort is normal.  Musculoskeletal:        General: Normal range of motion.     Cervical back: Normal range of motion.  Skin:    General: Skin is dry.  Neurological:     Mental Status: He is alert and oriented to person, place, and time.  Psychiatric:        Attention and Perception: Attention and perception normal.        Mood and Affect: Mood is anxious and depressed.        Speech: Speech normal.        Behavior: Behavior normal. Behavior is cooperative.        Thought  Content: Thought content normal.        Cognition and Memory: Cognition and memory normal.        Judgment: Judgment is impulsive.    Review of Systems  Constitutional: Negative.   HENT: Negative.    Eyes: Negative.   Respiratory: Negative.    Cardiovascular: Negative.   Gastrointestinal:        Reports issues with his abdomen especially liver and have CT abdomen ordered by PCP  Genitourinary: Negative.   Musculoskeletal: Negative.   Skin: Negative.   Neurological: Negative.   Endo/Heme/Allergies: Negative.   Psychiatric/Behavioral:  Positive for depression. The patient is nervous/anxious and has insomnia.        Alcoholism   Blood pressure (!) 194/107, pulse 83, temperature 97.9 F (36.6 C), temperature source Oral, resp. rate 18, SpO2 100%. There is no height or weight on file to calculate BMI.  Medical Decision Making: Patient meets criteria for inpatient Psychiatry evaluation for detox treatment.  We discussed plan for long term alcohol treatment/rehabilitation.  Patient denies SI/HI/AVH.  We will fax out records seeking bed placement.  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

## 2023-07-05 NOTE — ED Provider Notes (Addendum)
Emergency Medicine Observation Re-evaluation Note  ADAMO MERRITHEW is a 47 y.o. male, seen on rounds today.  Pt initially presented to the ED for complaints of IVC and Aggressive Behavior Currently, the patient is awake and alert. He did come to the ED yesterday with hallucinations and IVC papers.  Pt requests tylenol this am.   Pt has not yet been assessed by TTS. Physical Exam  BP (!) 176/98 (BP Location: Left Arm) Comment: RN NOTIFIED  Pulse 83   Temp 98.8 F (37.1 C) (Oral)   Resp 18   SpO2 100%  Physical Exam General: awake and alert Cardiac: rr Lungs: clear Psych: calm  ED Course / MDM  EKG:   I have reviewed the labs performed to date as well as medications administered while in observation.  Recent changes in the last 24 hours include ED eval.  Plan  Current plan is for TTS eval.  BP has been consistently elevated.  Amlodipine 5 mg started.    Jacalyn Lefevre, MD 07/05/23 1610    Jacalyn Lefevre, MD 07/05/23 [phone]

## 2023-07-06 ENCOUNTER — Encounter (HOSPITAL_COMMUNITY): Payer: Self-pay | Admitting: Nurse Practitioner

## 2023-07-06 ENCOUNTER — Other Ambulatory Visit: Payer: Self-pay

## 2023-07-06 DIAGNOSIS — F1090 Alcohol use, unspecified, uncomplicated: Secondary | ICD-10-CM | POA: Diagnosis not present

## 2023-07-06 MED ORDER — LORAZEPAM 1 MG PO TABS
1.0000 mg | ORAL_TABLET | Freq: Two times a day (BID) | ORAL | Status: AC
  Administered 2023-07-08 (×2): 1 mg via ORAL
  Filled 2023-07-06: qty 1

## 2023-07-06 MED ORDER — LORAZEPAM 1 MG PO TABS: 2.0000 mg | ORAL_TABLET | Freq: Three times a day (TID) | ORAL | Status: DC | PRN

## 2023-07-06 MED ORDER — LORAZEPAM 1 MG PO TABS
1.0000 mg | ORAL_TABLET | Freq: Three times a day (TID) | ORAL | Status: AC
  Administered 2023-07-07: 1 mg via ORAL
  Filled 2023-07-06 (×4): qty 1

## 2023-07-06 MED ORDER — MAGNESIUM HYDROXIDE 400 MG/5ML PO SUSP: 30.0000 mL | Freq: Every day | ORAL | Status: DC | PRN

## 2023-07-06 MED ORDER — HALOPERIDOL 5 MG PO TABS: 5.0000 mg | ORAL_TABLET | Freq: Three times a day (TID) | ORAL | Status: DC | PRN

## 2023-07-06 MED ORDER — VITAMIN B-1 100 MG PO TABS
100.0000 mg | ORAL_TABLET | Freq: Every day | ORAL | Status: DC
  Administered 2023-07-06 – 2023-07-11 (×6): 100 mg via ORAL
  Filled 2023-07-06 (×9): qty 1

## 2023-07-06 MED ORDER — ACETAMINOPHEN 325 MG PO TABS
650.0000 mg | ORAL_TABLET | Freq: Four times a day (QID) | ORAL | Status: DC | PRN
  Administered 2023-07-06 – 2023-07-10 (×3): 650 mg via ORAL
  Filled 2023-07-06 (×3): qty 2

## 2023-07-06 MED ORDER — HALOPERIDOL LACTATE 5 MG/ML IJ SOLN: 5.0000 mg | Freq: Three times a day (TID) | INTRAMUSCULAR | Status: DC | PRN

## 2023-07-06 MED ORDER — DIPHENHYDRAMINE HCL 50 MG/ML IJ SOLN: 50.0000 mg | Freq: Three times a day (TID) | INTRAMUSCULAR | Status: DC | PRN

## 2023-07-06 MED ORDER — CLONIDINE HCL 0.1 MG PO TABS
0.1000 mg | ORAL_TABLET | Freq: Once | ORAL | Status: AC
  Administered 2023-07-06: 0.1 mg via ORAL
  Filled 2023-07-06 (×2): qty 1

## 2023-07-06 MED ORDER — DIPHENHYDRAMINE HCL 25 MG PO CAPS: 50.0000 mg | ORAL_CAPSULE | Freq: Three times a day (TID) | ORAL | Status: DC | PRN

## 2023-07-06 MED ORDER — LORAZEPAM 1 MG PO TABS
1.0000 mg | ORAL_TABLET | Freq: Four times a day (QID) | ORAL | Status: AC
  Administered 2023-07-06 – 2023-07-07 (×6): 1 mg via ORAL
  Filled 2023-07-06 (×5): qty 1

## 2023-07-06 MED ORDER — LORAZEPAM 1 MG PO TABS: 1.0000 mg | ORAL_TABLET | Freq: Four times a day (QID) | ORAL | Status: AC | PRN

## 2023-07-06 MED ORDER — HYDROXYZINE HCL 25 MG PO TABS: 25.0000 mg | ORAL_TABLET | Freq: Four times a day (QID) | ORAL | Status: AC | PRN

## 2023-07-06 MED ORDER — NICOTINE 14 MG/24HR TD PT24
14.0000 mg | MEDICATED_PATCH | Freq: Every day | TRANSDERMAL | Status: DC
  Administered 2023-07-06 – 2023-07-11 (×5): 14 mg via TRANSDERMAL
  Filled 2023-07-06 (×8): qty 1

## 2023-07-06 MED ORDER — LORAZEPAM 2 MG/ML IJ SOLN: 2.0000 mg | Freq: Three times a day (TID) | INTRAMUSCULAR | Status: DC | PRN

## 2023-07-06 MED ORDER — TRAZODONE HCL 50 MG PO TABS
50.0000 mg | ORAL_TABLET | Freq: Every evening | ORAL | Status: DC | PRN
  Administered 2023-07-06 – 2023-07-10 (×6): 50 mg via ORAL
  Filled 2023-07-06: qty 1
  Filled 2023-07-06: qty 7
  Filled 2023-07-06 (×5): qty 1

## 2023-07-06 MED ORDER — SERTRALINE HCL 25 MG PO TABS
25.0000 mg | ORAL_TABLET | Freq: Every day | ORAL | Status: DC
  Administered 2023-07-06 – 2023-07-07 (×2): 25 mg via ORAL
  Filled 2023-07-06 (×4): qty 1

## 2023-07-06 MED ORDER — SODIUM CHLORIDE 0.9 % IN NEBU
INHALATION_SOLUTION | RESPIRATORY_TRACT | Status: AC
  Filled 2023-07-06: qty 3

## 2023-07-06 MED ORDER — POTASSIUM CHLORIDE CRYS ER 20 MEQ PO TBCR
40.0000 meq | EXTENDED_RELEASE_TABLET | Freq: Once | ORAL | Status: AC
  Administered 2023-07-06: 40 meq via ORAL
  Filled 2023-07-06: qty 2

## 2023-07-06 MED ORDER — ALUM & MAG HYDROXIDE-SIMETH 200-200-20 MG/5ML PO SUSP: 30.0000 mL | ORAL | Status: DC | PRN

## 2023-07-06 MED ORDER — ONDANSETRON 4 MG PO TBDP: 4.0000 mg | ORAL_TABLET | Freq: Four times a day (QID) | ORAL | Status: AC | PRN

## 2023-07-06 MED ORDER — LORAZEPAM 1 MG PO TABS
1.0000 mg | ORAL_TABLET | Freq: Every day | ORAL | Status: AC
  Administered 2023-07-09: 1 mg via ORAL
  Filled 2023-07-06: qty 1

## 2023-07-06 MED ORDER — NICOTINE POLACRILEX 2 MG MT GUM: 2.0000 mg | CHEWING_GUM | OROMUCOSAL | Status: DC | PRN

## 2023-07-06 MED ORDER — THIAMINE HCL 100 MG/ML IJ SOLN: 100.0000 mg | Freq: Once | INTRAMUSCULAR | Status: DC

## 2023-07-06 MED ORDER — ADULT MULTIVITAMIN W/MINERALS CH
1.0000 | ORAL_TABLET | Freq: Every day | ORAL | Status: DC
  Administered 2023-07-06 – 2023-07-11 (×6): 1 via ORAL
  Filled 2023-07-06 (×9): qty 1

## 2023-07-06 MED ORDER — LOPERAMIDE HCL 2 MG PO CAPS: 2.0000 mg | ORAL_CAPSULE | ORAL | Status: AC | PRN

## 2023-07-06 NOTE — [facility] Group Notes (Signed)
[facility] Group Notes:  (Nursing/MHT/Case Management/Adjunct)  Date:  07/06/2023  Time:  2000 Type of Therapy:   Wrap up group  Participation Level:  Active  Participation Quality:  Appropriate, Attentive, Sharing, and Supportive  Affect:  Appropriate  Cognitive:  Alert  Insight:  Improving  Engagement in Group:  Engaged  Modes of Intervention:  Clarification, Education, and Support  Summary of Progress/Problems: Positive thinking and positive change were discussed.   Marcille Buffy 07/06/2023, 8:36 PM

## 2023-07-06 NOTE — [facility] Suicide Risk Assessment (Signed)
[facility] INPATIENT:  Family/Significant Other Suicide Prevention Education  Suicide Prevention Education:  Education Completed; Nicholas Fletcher,  (nspouse [phone] has been identified by the patient as the family member/significant other with whom the patient will be residing, and identified as the person(s) who will aid the patient in the event of a mental health crisis (suicidal ideations/suicide attempt).  With written consent from the patient, the family member/significant other has been provided the following suicide prevention education, prior to the and/or following the discharge of the patient.  The suicide prevention education provided includes the following: Suicide risk factors Suicide prevention and interventions National Suicide Hotline telephone number [facility] assessment telephone number [facility] Emergency Assistance 911 [facility] and/or Residential Mobile Crisis Unit telephone number  Request made of family/significant other to: Remove weapons (e.g., guns, rifles, knives), all items previously/currently identified as safety concern.   Remove drugs/medications (over-the-counter, prescriptions, illicit drugs), all items previously/currently identified as a safety concern.  The family member/significant other verbalizes understanding of the suicide prevention education information provided.  The family member/significant other agrees to remove the items of safety concern listed above.  Nicholas Fletcher Glad 07/06/2023, 1:13 PM

## 2023-07-06 NOTE — Progress Notes (Signed)
   07/06/23 0800  Psych Admission Type (Psych Patients Only)  Admission Status Involuntary  Psychosocial Assessment  Patient Complaints Worrying  Eye Contact Fair  Facial Expression Anxious;Flat  Affect Appropriate to circumstance  Speech Logical/coherent  Interaction Assertive;Cautious  Motor Activity Slow  Appearance/Hygiene Unremarkable  Behavior Characteristics Cooperative;Appropriate to situation  Mood Apprehensive  Thought Process  Coherency WDL  Content WDL  Delusions None reported or observed  Perception WDL  Hallucination None reported or observed  Judgment Impaired  Confusion None  Danger to Self  Current suicidal ideation? Denies  Danger to Others  Danger to Others None reported or observed

## 2023-07-06 NOTE — H&P (Addendum)
Psychiatric Admission Assessment Adult  Patient Identification: Nicholas Fletcher MRN:  [id number] Date of Evaluation:  07/06/2023 Chief Complaint:  Alcohol use disorder [F10.90] Principal Diagnosis: Alcohol use disorder Diagnosis:  Principal Problem:   Alcohol use disorder Depression  CC:" Huge miscommunication with my wife related to my alcohol consumption."  History of Present Illness: Teryl A. Rossen is a 47 year old AA male with prior psychiatric diagnoses significant for long-term alcoholism and GAD who is involuntarily admitted to [facility] from [name], ED for aggressive behavior towards his wife.  In the context of alcohol intoxication.  After medical evaluation/stabilization & clearance, he was transferred to the [facility] for further psychiatric evaluation & treatments.  Patient reports that he has been an alcoholic for a while and that he has been sober for 19 days then 2 days ago he had "little drink" with his friends.  Patient states he had an altercation with his wife and she went to the court and secured IVC on him.  Gildo further reports that he has been informed that his long term use of Alcohol is affecting his liver and his over all health.  Patient states that he received his new insurance recently and plans to start taking care of his health.  Patient states  he was diagnosed with anxiety two years ago and was placed on Hydroxyzine.  He stopped taking it after taking four doses because he was having Hallucinating after taking hydroxyzine.  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  Patient reports poor sleep and appetite. Patient reports that he has been feeling depressed but did not seek help.  He rated depression 6/10 with 10 being severe depression.  Patient denies SI/HI/AVH and denies previous or  family hx of suicide.  Alcohol level on arrival was 238, UDS positive for Benzodiazepine.  Patient denies previous mental health care and no previous inpatient Psychiatric hospitalization.  AST/ALT are elevated.  Evaluation: Patient is examined in his room with this provider and Dr. Renaldo Fiddler sitting up in bed.  He is alert, pleasant, calm and oriented x 4.  Attention to hygiene is commendable.  Speech clear and coherent with normal volume and pattern however, minimizing his alcoholic symptoms.  Able to maintain good eye contact with the providers.  He presents with anxious and depressed mood although his affect is pleasant.  Thought process and thought content coherent and relevant.  He denies delusional thinking, paranoia, mania symptoms, psychotic symptoms, or history of PTSD.  He further denies SI, HI, or AVH.  Vital signs with pulse 78 and blood pressure 144/102, clonidine tablet 0.1 mg administered.  He is afebrile.  He continues on alcohol detox protocol with CIWA and agitation protocol.  CIWA score of 3 at 12 noon today.  We will continue to monitor for signs of alcohol withdrawal.  Labs reviewed indicated below in treatment plan.  Patient is admitted for stabilization, medication management and safety.  Mode of transport to Hospital: Safe transport Current Outpatient (Home) Medication List: See home medication listings PRN medication prior to evaluation: See home medication listing  ED course: Patient placed on CIWA protocol.  Homemade that we listed was placed on hold due to CIWA protocol.  Labs ordered and analyzed.  Patient was dispositioned to [facility] for further psychiatric evaluation and treatment. Collateral  Information: None obtained at this time POA/Legal Guardian: Patient is his own legal guardian  Past Psychiatric Hx: Previous Psych Diagnoses: Denies Prior inpatient treatment: Denies Current/prior outpatient treatment: Sertraline for anxiety.  Hydroxyzine causes hallucination Prior  rehab hx: Denies Psychotherapy hx: Yes History of suicide: Denies History of homicide or aggression: Denies Psychiatric medication history: Denies Psychiatric medication compliance history: Patient has not been prescribed psychotropic meds Neuromodulation history: Denies Current Psychiatrist: Denies Current therapist: Denies  Substance Abuse Hx: Alcohol: Drank 3 shots of vodka daily.  Was sober for 18 days, then relapsed 2 days ago Tobacco: Illicit drugs: Denies Rx drug abuse: Denies Rehab hx: Denies  Past Medical History: Medical Diagnoses: Questionable high blood pressure Home Rx: Denies Prior Hosp: Denies Prior Surgeries/Trauma: Denies Head trauma, LOC, concussions, seizures: Has history of seizures.  Last seizures activity in July 2024 Allergies: No known drug allergies LMP: Not applicable Contraception: Not applicable PCP: Denies  Family History: Medical: Maternal grandmother has cancer and died at 42 years old Psych: Denies Psych Rx: Denies SA/HA: Denies Substance use family hx: Denies  Social History: Childhood (bring, raised, lives now, parents, siblings, schooling, education): Graduated from college Abuse: Denies history of abuse Marital Status: Married Sexual orientation: Male from birth Children: 3 boys Employment: Engineer, manufacturing Group: Denies Housing: Has housing Finances: Child psychotherapist: Denies Special educational needs teacher: Denies serving in the [employer]  Associated Signs/Symptoms: Depression Symptoms:  insomnia, fatigue, anxiety,  (Hypo) Manic Symptoms:  Irritable Mood,  Anxiety Symptoms:  Excessive Worry,  Psychotic Symptoms:   Not applicable.  History of hallucination when taking hydroxyzine  PTSD Symptoms: NA  Total Time spent with patient: 1 hour  Past Psychiatric History: History of alcoholism x 20 years  Is the patient at risk to self? Yes.    Has the patient been a risk to self in the past 6 months? Yes.    Has the  patient been a risk to self within the distant past? Yes.    Is the patient a risk to others? No.  Has the patient been a risk to others in the past 6 months? No.  Has the patient been a risk to others within the distant past? No.   [place] Scale:  Flowsheet Row [facility] ([facility]) [facility] from [facility]04/2023 in [facility] Department at [facility] ED from 06/05/2023 in [facility] Emergency Department at [facility]  C-SSRS RISK CATEGORY No Risk No Risk No Risk      Alcohol Screening: 1. How often do you have a drink containing alcohol?: 4 or more times a week 2. How many drinks containing alcohol do you have on a typical day when you are drinking?: 3 or 4 3. How often do you have six or more drinks on one occasion?: Weekly AUDIT-C Score: 8 4. How often during the last year have you found that you were not able to stop drinking once you had started?: Never 5. How often during the last year have you failed to do what was normally expected from you because of drinking?: Never 6. How often during the last year have you needed a first drink in the morning to get yourself going after a heavy drinking session?: Never 7. How often during the last year have you had a feeling of guilt of remorse after drinking?: Never 8. How often during the last year have you been unable to remember what happened the night before because you  had been drinking?: Never 9. Have you or someone else been injured as a result of your drinking?: No 10. Has a relative or friend or a doctor or another health worker been concerned about your drinking or suggested you cut down?: Yes, during the last year Alcohol Use Disorder Identification Test Final Score (AUDIT): 12 Alcohol Brief Interventions/Follow-up: Alcohol education/Brief advice  Substance Abuse History in the last 12 months:  Yes.    Consequences of Substance Abuse: Discussed with  patient during this admission evaluation. Medical Consequences:  Liver damage, Possible death by overdose Legal Consequences:  Arrests, jail time, Loss of driving privilege. Family Consequences:  Family discord, divorce and or separation.  Previous Psychotropic Medications: Yes  Psychological Evaluations: Yes  Past Medical History:  Past Medical History:  Diagnosis Date   Medical history non-contributory    Seizures (HCC)    pt reports having a seizure 05/2023    Past Surgical History:  Procedure Laterality Date   COLONOSCOPY WITH PROPOFOL N/A 09/26/2021   Procedure: COLONOSCOPY WITH PROPOFOL;  Surgeon: Regis Bill, MD;  Location: [facility] ENDOSCOPY;  Service: Endoscopy;  Laterality: N/A;   COLONOSCOPY WITH PROPOFOL N/A 09/28/2021   Procedure: COLONOSCOPY WITH PROPOFOL;  Surgeon: Regis Bill, MD;  Location: [facility] ENDOSCOPY;  Service: Endoscopy;  Laterality: N/A;   ESOPHAGOGASTRODUODENOSCOPY (EGD) WITH PROPOFOL N/A 09/26/2021   Procedure: ESOPHAGOGASTRODUODENOSCOPY (EGD) WITH PROPOFOL;  Surgeon: Regis Bill, MD;  Location: [facility] ENDOSCOPY;  Service: Endoscopy;  Laterality: N/A;   TONSILLECTOMY     Family History:  Family History  Family history unknown: Yes   Tobacco Screening:  Social History   Tobacco Use  Smoking Status Light Smoker   Types: Cigars  Smokeless Tobacco Never    BH Tobacco Counseling     Are you interested in Tobacco Cessation Medications?  No, patient refused Counseled patient on smoking cessation:  No value filed. Reason Tobacco Screening Not Completed: No value filed.    Social History:  Social History   Substance and Sexual Activity  Alcohol Use Yes   Alcohol/week: 4.0 standard drinks of alcohol   Types: 1 Cans of beer, 3 Shots of liquor per week     Social History   Substance and Sexual Activity  Drug Use Not Currently    Additional Social History:   Allergies:  No Known Allergies Lab Results:  Results for orders placed or  performed during the hospital encounter of 07/04/23 (from the past 48 hour(s))  Comprehensive metabolic panel     Status: Abnormal   Collection Time: 07/04/23 11:01 PM  Result Value Ref Range   Sodium 143 135 - 145 mmol/L   Potassium 3.2 (L) 3.5 - 5.1 mmol/L   Chloride 104 98 - 111 mmol/L   CO2 22 22 - 32 mmol/L   Glucose, Bld 85 70 - 99 mg/dL    Comment: Glucose reference range applies only to samples taken after fasting for at least 8 hours.   BUN 7 6 - 20 mg/dL   Creatinine, Ser 1.61 0.61 - 1.24 mg/dL   Calcium 9.0 8.9 - 09.6 mg/dL   Total Protein 7.8 6.5 - 8.1 g/dL   Albumin 4.2 3.5 - 5.0 g/dL   AST 66 (H) 15 - 41 U/L   ALT 19 0 - 44 U/L   Alkaline Phosphatase 87 38 - 126 U/L   Total Bilirubin 1.3 (H) 0.3 - 1.2 mg/dL   GFR, Estimated >04 >54 mL/min    Comment: (NOTE) Calculated using the CKD-EPI  Creatinine Equation (2021)    Anion gap 17 (H) 5 - 15    Comment: Performed at [facility], [address] [place], [place] [address]  Ethanol     Status: Abnormal   Collection Time: 07/04/23 11:01 PM  Result Value Ref Range   Alcohol, Ethyl (B) 238 (H) <10 mg/dL    Comment: (NOTE) Lowest detectable limit for serum alcohol is 10 mg/dL.  For medical purposes only. Performed at [facility], [address] [place], [place] [address]   Salicylate level     Status: Abnormal   Collection Time: 07/04/23 11:01 PM  Result Value Ref Range   Salicylate Lvl <7.0 (L) 7.0 - 30.0 mg/dL    Comment: Performed at [facility], [address] [place], [place] [address]  Acetaminophen level     Status: Abnormal   Collection Time: 07/04/23 11:01 PM  Result Value Ref Range   Acetaminophen (Tylenol), Serum <10 (L) 10 - 30 ug/mL    Comment: (NOTE) Therapeutic concentrations vary significantly. A range of 10-30 ug/mL  may be an effective concentration for many patients. However, some  are best treated at concentrations outside of this  range. Acetaminophen concentrations >150 ug/mL at 4 hours after ingestion  and >50 ug/mL at 12 hours after ingestion are often associated with  toxic reactions.  Performed at [facility], [address], [place], [place] [address]   cbc     Status: Abnormal   Collection Time: 07/04/23 11:01 PM  Result Value Ref Range   WBC 7.2 4.0 - 10.5 K/uL   RBC 4.21 (L) 4.22 - 5.81 MIL/uL   Hemoglobin 14.7 13.0 - 17.0 g/dL   HCT 62.9 52.8 - 41.3 %   MCV 101.7 (H) 80.0 - 100.0 fL   MCH 34.9 (H) 26.0 - 34.0 pg   MCHC 34.3 30.0 - 36.0 g/dL   RDW 24.4 01.0 - 27.2 %   Platelets 281 150 - 400 K/uL   nRBC 0.0 0.0 - 0.2 %    Comment: Performed at [facility], [address], [place], [place] [address]  Rapid urine drug screen (hospital performed)     Status: Abnormal   Collection Time: 07/04/23 11:12 PM  Result Value Ref Range   Opiates NONE DETECTED NONE DETECTED   Cocaine NONE DETECTED NONE DETECTED   Benzodiazepines POSITIVE (A) NONE DETECTED   Amphetamines NONE DETECTED NONE DETECTED   Tetrahydrocannabinol NONE DETECTED NONE DETECTED   Barbiturates NONE DETECTED NONE DETECTED    Comment: (NOTE) DRUG SCREEN FOR MEDICAL PURPOSES ONLY.  IF CONFIRMATION IS NEEDED FOR ANY PURPOSE, NOTIFY LAB WITHIN 5 DAYS.  LOWEST DETECTABLE LIMITS FOR URINE DRUG SCREEN Drug Class                     Cutoff (ng/mL) Amphetamine and metabolites    1000 Barbiturate and metabolites    200 Benzodiazepine                 200 Opiates and metabolites        300 Cocaine and metabolites        300 THC                            50 Performed at [facility], [address], [place], [place] [address]    Blood Alcohol level:  Lab Results  Component Value Date  ETH 238 (H) 07/04/2023   ETH <10 06/05/2023   Metabolic Disorder Labs:  No results found for: "HGBA1C", "MPG" No results found for: "PROLACTIN" No results found for: "CHOL", "TRIG", "HDL",  "CHOLHDL", "VLDL", "LDLCALC"  Current Medications: Current Facility-Administered Medications  Medication Dose Route Frequency Provider Last Rate Last Admin   acetaminophen (TYLENOL) tablet 650 mg  650 mg Oral Q6H PRN Bobbitt, Shalon E, NP       alum & mag hydroxide-simeth (MAALOX/MYLANTA) 200-200-20 MG/5ML suspension 30 mL  30 mL Oral Q4H PRN Bobbitt, Shalon E, NP       diphenhydrAMINE (BENADRYL) capsule 50 mg  50 mg Oral TID PRN Bobbitt, Shalon E, NP       Or   diphenhydrAMINE (BENADRYL) injection 50 mg  50 mg Intramuscular TID PRN Bobbitt, Shalon E, NP       haloperidol (HALDOL) tablet 5 mg  5 mg Oral TID PRN Bobbitt, Shalon E, NP       Or   haloperidol lactate (HALDOL) injection 5 mg  5 mg Intramuscular TID PRN Bobbitt, Shalon E, NP       hydrOXYzine (ATARAX) tablet 25 mg  25 mg Oral Q6H PRN Bobbitt, Shalon E, NP       loperamide (IMODIUM) capsule 2-4 mg  2-4 mg Oral PRN Bobbitt, Shalon E, NP       LORazepam (ATIVAN) tablet 2 mg  2 mg Oral TID PRN Bobbitt, Shalon E, NP       Or   LORazepam (ATIVAN) injection 2 mg  2 mg Intramuscular TID PRN Bobbitt, Shalon E, NP       LORazepam (ATIVAN) tablet 1 mg  1 mg Oral Q6H PRN Bobbitt, Shalon E, NP       LORazepam (ATIVAN) tablet 1 mg  1 mg Oral QID Bobbitt, Shalon E, NP   1 mg at 07/06/23 4098   Followed by   Melene Muller ON 07/07/2023] LORazepam (ATIVAN) tablet 1 mg  1 mg Oral TID Bobbitt, Shalon E, NP       Followed by   Melene Muller ON 07/08/2023] LORazepam (ATIVAN) tablet 1 mg  1 mg Oral BID Bobbitt, Shalon E, NP       Followed by   Melene Muller ON 07/10/2023] LORazepam (ATIVAN) tablet 1 mg  1 mg Oral Daily Bobbitt, Shalon E, NP       magnesium hydroxide (MILK OF MAGNESIA) suspension 30 mL  30 mL Oral Daily PRN Bobbitt, Shalon E, NP       multivitamin with minerals tablet 1 tablet  1 tablet Oral Daily Bobbitt, Shalon E, NP   1 tablet at 07/06/23 0826   ondansetron (ZOFRAN-ODT) disintegrating tablet 4 mg  4 mg Oral Q6H PRN Bobbitt, Shalon E, NP       sertraline  (ZOLOFT) tablet 25 mg  25 mg Oral Daily Semir Brill C, FNP       thiamine (Vitamin B-1) tablet 100 mg  100 mg Oral Daily Bobbitt, Shalon E, NP   100 mg at 07/06/23 1191   thiamine (VITAMIN B1) injection 100 mg  100 mg Intramuscular Once Bobbitt, Shalon E, NP       traZODone (DESYREL) tablet 50 mg  50 mg Oral QHS PRN Bobbitt, Shalon E, NP   50 mg at 07/06/23 0050   PTA Medications: Medications Prior to Admission  Medication Sig Dispense Refill Last Dose   ibuprofen (ADVIL) 600 MG tablet Take 600 mg by mouth every 6 (six) hours as needed for headache.   Unknown  omeprazole (PRILOSEC) 20 MG capsule Take 20 mg by mouth daily.   Unknown   Musculoskeletal: Strength & Muscle Tone: within normal limits Gait & Station: normal Patient leans: N/A  Psychiatric Specialty Exam:  Presentation  General Appearance:  Casual; Well Groomed; Neat  Eye Contact: Good  Speech: Clear and Coherent; Normal Rate  Speech Volume: Normal  Handedness: Right  Mood and Affect  Mood: Anxious  Affect: Appropriate; Congruent  Thought Process  Thought Processes: Coherent; Goal Directed  Duration of Psychotic Symptoms:N/A  Past Diagnosis of Schizophrenia or Psychoactive disorder: No data recorded  Descriptions of Associations:Intact  Orientation:Full (Time, Place and Person)  Thought Content:Logical  Hallucinations:Hallucinations: None  Ideas of Reference:None  Suicidal Thoughts:Suicidal Thoughts: No  Homicidal Thoughts:Homicidal Thoughts: No  Sensorium  Memory: Immediate Good; Recent Good  Judgment: Fair  Insight: Shallow  Executive Functions  Concentration: Good  Attention Span: Good  Recall: Fair  Fund of Knowledge: Good  Language: Good  Psychomotor Activity  Psychomotor Activity: Psychomotor Activity: Normal  Assets  Assets: Communication Skills; Desire for Improvement; Housing; Physical Health; Social Support; Resilience  Sleep  Sleep: Sleep:  Good Number of Hours of Sleep: 6  Physical Exam: Physical Exam Vitals and nursing note reviewed.  HENT:     Head: Normocephalic.     Nose: Nose normal.     Mouth/Throat:     Mouth: Mucous membranes are moist.  Eyes:     Extraocular Movements: Extraocular movements intact.  Cardiovascular:     Rate and Rhythm: Normal rate.     Pulses: Normal pulses.  Pulmonary:     Effort: Pulmonary effort is normal.  Abdominal:     Comments: Deferred  Genitourinary:    Comments: Deferred Musculoskeletal:        General: Normal range of motion.     Cervical back: Normal range of motion.  Skin:    General: Skin is warm.  Neurological:     General: No focal deficit present.     Mental Status: He is alert and oriented to person, place, and time.  Psychiatric:        Mood and Affect: Mood normal.        Behavior: Behavior normal.    Review of Systems  Constitutional:  Negative for chills and fever.  HENT:  Negative for sore throat.   Eyes:  Negative for blurred vision.  Respiratory:  Negative for cough, shortness of breath and wheezing.   Cardiovascular:  Negative for chest pain and palpitations.  Gastrointestinal:  Negative for abdominal pain, heartburn, nausea and vomiting.  Genitourinary: Negative.   Musculoskeletal: Negative.   Skin:  Negative for itching and rash.  Neurological:  Negative for dizziness, tingling and headaches.  Endo/Heme/Allergies:        See allergy list  Psychiatric/Behavioral:  Positive for depression, hallucinations (Hallucination with taking hydroxyzine) and substance abuse. The patient is nervous/anxious and has insomnia.    Blood pressure (!) 144/102, pulse 78, temperature 99 F (37.2 C), temperature source Oral, resp. rate 18, height 5\' 11"  (1.803 m), weight 79.8 kg, SpO2 100%. Body mass index is 24.55 kg/m.  Treatment Plan Summary: Daily contact with patient to assess and evaluate symptoms and progress in treatment and Medication management  Physician  Treatment Plan for Primary Diagnosis:  Assessment: Alcohol use disorder  Plans: Medications: Initiate sertraline 25 mg p.o. daily for anxiety / depression Continue trazodone tablets 50 mg p.o. nightly as needed for insomnia Initiate potassium chloride 40 mEq x 1 dose only  Ativan detox protocol with CIWA: See MAR  Agitation protocol: Benadryl capsule 50 mg p.o. or IM 3 times daily as needed agitation   Haldol tablets 5 mg po IM 3 times daily as needed agitation   Lorazepam tablet 2 mg p.o. or IM 3 times daily as needed agitation    Other PRN Medications -Acetaminophen 650 mg every 6 as needed/mild pain -Maalox 30 mL oral every 4 as needed/digestion -Magnesium hydroxide 30 mL daily as needed/mild constipation -- Nicotine gum 2 mg p.o. for smoking cessation  -- The risks/benefits/side-effects/alternatives to this medication were discussed in detail with the patient and time was given for questions. The patient consents to medication trial.  -- Metabolic profile and EKG monitoring obtained while on an atypical antipsychotic (BMI: Lipid Panel: HbgA1c: QTc:)  -- Encouraged patient to participate in unit milieu and in scheduled group therapies   Labs reviewed: CMP: Potassium 3.2, replace with 40 mEq of potassium chloride x 1 dose only; AST 66 high, total bilirubin 1.3 high.  CBC with differential: RBC 4.21 low, MCV 101 0.7 high, MCH 34.9 high.  BAL 238 high.  UDS positive for benzodiazepine.  Labs ordered: Lipid panel, hemoglobin A1c, TSH, BMP in a.m.  EKG: Last EKG 06/24/2022.   Safety and Monitoring: Voluntary admission to inpatient psychiatric unit for safety, stabilization and treatment Daily contact with patient to assess and evaluate symptoms and progress in treatment Patient's case to be discussed in multi-disciplinary team meeting Observation Level : q15 minute checks Vital signs: q12 hours Precautions: suicide, but pt currently verbally contracts for safety on unit     Discharge Planning: Social work and case management to assist with discharge planning and identification of hospital follow-up needs prior to discharge Estimated LOS: 5-7 days Discharge Concerns: Need to establish a safety plan; Medication compliance and effectiveness Discharge Goals: Return home with outpatient referrals for mental health follow-up including medication management/psychotherapy.  Long Term Goal(s): Improvement in symptoms so as ready for discharge  Short Term Goals: Ability to identify changes in lifestyle to reduce recurrence of condition will improve, Ability to verbalize feelings will improve, Ability to disclose and discuss suicidal ideas, Ability to demonstrate self-control will improve, Ability to identify and develop effective coping behaviors will improve, Ability to maintain clinical measurements within normal limits will improve, Compliance with prescribed medications will improve, and Ability to identify triggers associated with substance abuse/mental health issues will improve  Physician Treatment Plan for Secondary Diagnosis: Principal Problem:   Alcohol use disorder Depression  I certify that inpatient services furnished can reasonably be expected to improve the patient's condition.    Cecilie Lowers, FNP 9/14/202412:57 PM

## 2023-07-06 NOTE — Plan of Care (Signed)
  Problem: Education: Goal: Emotional status will improve Outcome: Not Progressing Goal: Mental status will improve Outcome: Not Progressing   
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
## 2023-07-06 NOTE — Tx Team (Signed)
Initial Treatment Plan 07/06/2023 3:08 AM Nicholas Fletcher [id number]    PATIENT STRESSORS: Health problems   Substance abuse     PATIENT STRENGTHS: Ability for insight  Average or above average intelligence  Work skills    PATIENT IDENTIFIED PROBLEMS:   Substance abuse  Depression                 DISCHARGE CRITERIA:  Improved stabilization in mood, thinking, and/or behavior  PRELIMINARY DISCHARGE PLAN: Return to previous living arrangement  PATIENT/FAMILY INVOLVEMENT: This treatment plan has been presented to and reviewed with the patient, Nicholas Fletcher, and/or family member.  The patient and family have been given the opportunity to ask questions and make suggestions.  Floyce Stakes, RN 07/06/2023, 3:08 AM
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
## 2023-07-06 NOTE — Plan of Care (Signed)
  Problem: Education: Goal: Knowledge of [facility] Education information/materials will improve Outcome: Progressing Goal: Emotional status will improve Outcome: Progressing Goal: Mental status will improve Outcome: Progressing Goal: Verbalization of understanding the information provided will improve Outcome: Progressing   Problem: Activity: Goal: Interest or engagement in activities will improve Outcome: Progressing   Problem: Coping: Goal: Ability to verbalize frustrations and anger appropriately will improve Outcome: Progressing Goal: Ability to demonstrate self-control will improve Outcome: Progressing

## 2023-07-06 NOTE — Group Note (Signed)
Date:  07/06/2023 Time:  12:58 PM  Group Topic/Focus:  Goals Group:   The focus of this group is to help patients establish daily goals to achieve during treatment and discuss how the patient can incorporate goal setting into their daily lives to aide in recovery.    Participation Level:  Active  Participation Quality:  Appropriate  Affect:  Appropriate  Cognitive:  Appropriate  Insight: Appropriate  Engagement in Group:  Engaged  Modes of Intervention:      Additional Comments:     Nicholas Fletcher 07/06/2023, 12:58 PM

## 2023-07-07 DIAGNOSIS — F1090 Alcohol use, unspecified, uncomplicated: Secondary | ICD-10-CM | POA: Diagnosis not present

## 2023-07-07 LAB — BASIC METABOLIC PANEL
Anion gap: 10 (ref 5–15)
BUN: <5 mg/dL — ABNORMAL LOW (ref 6–20)
CO2: 28 mmol/L (ref 22–32)
Calcium: 9.4 mg/dL (ref 8.9–10.3)
Chloride: 97 mmol/L — ABNORMAL LOW (ref 98–111)
Creatinine, Ser: 0.66 mg/dL (ref 0.61–1.24)
GFR, Estimated: >60 mL/min (ref >60)
Glucose, Bld: 91 mg/dL (ref 70–99)
Potassium: 3.3 mmol/L — ABNORMAL LOW (ref 3.5–5.1)
Sodium: 135 mmol/L (ref 135–145)

## 2023-07-07 MED ORDER — NAPHAZOLINE-PHENIRAMINE 0.025-0.3 % OP SOLN
1.0000 [drp] | Freq: Four times a day (QID) | OPHTHALMIC | Status: DC | PRN
  Administered 2023-07-07 – 2023-07-11 (×7): 1 [drp] via OPHTHALMIC
  Filled 2023-07-07: qty 5
  Filled 2023-07-07: qty 15

## 2023-07-07 MED ORDER — POTASSIUM CHLORIDE CRYS ER 20 MEQ PO TBCR
40.0000 meq | EXTENDED_RELEASE_TABLET | Freq: Once | ORAL | Status: AC
  Administered 2023-07-07: 40 meq via ORAL
  Filled 2023-07-07 (×2): qty 2

## 2023-07-07 NOTE — Group Note (Signed)
[facility] LCSW Group Therapy Note  Date/Time:  07/07/2023 10:00am-11:00am  Type of Therapy and Topic:  Group Therapy:  Healthy and Unhealthy Coping Skills and Supports  Participation Level:  Active   Description of Group:  Patients in this group explored the differences between healthy and unhealthy, specifically with coping skills and supports, using the worksheet below.  Many examples were provided by CSW and group members.  The emphasis was on providing hope for change.  A demonstration was given about how to set boundaries with family and/or friends, which patients expressed was beneficial.    Therapeutic Goals:   1)  compare healthy versus unhealthy coping skills and healthy versus unhealthy supports 2)  identify examples of each and demonstrate commonalities within the group  3)  generate ideas of healthy coping skills and supports that can be added  4)  discuss importance of adding healthy supports and setting boundaries with unhealthy supports  5)  offer mutual support about how to address unhealthy coping skills and supports  6)  encourage active participation in group   Healthy vs. 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  Fast Usually takes time to develop  Easy Often difficult to learn  Often effortless, can be done without thought Usually takes effort, thinking about it  Usually a habit Usually unknown, has to become a habit  Can do alone Often need to reach out for help   Leads to loss Leads to gain         My Unhealthy Coping Skills                                    My Healthy Coping Skills                       My Unhealthy Supports                                           My Healthy Supports                       Summary of Patient Progress:  The patient expressed himself frequently and  helpfully throughout group.  He raised questions and was attentive/respectful to responses given to him.   Therapeutic Modalities:   Psychoeducation Brief Solution-Focused Therapy  Ambrose Mantle, LCSW

## 2023-07-07 NOTE — Plan of Care (Signed)
Problem: Education: Goal: Knowledge of [facility] Education information/materials will improve Outcome: Progressing   Problem: Activity: Goal: Interest or engagement in activities will improve Outcome: Progressing   Problem: Coping: Goal: Ability to verbalize frustrations and anger appropriately will improve Outcome: Progressing

## 2023-07-07 NOTE — Plan of Care (Signed)
  Problem: Education: Goal: Knowledge of [facility] Education information/materials will improve Outcome: Progressing Goal: Emotional status will improve Outcome: Progressing Goal: Mental status will improve Outcome: Progressing Goal: Verbalization of understanding the information provided will improve Outcome: Progressing   Problem: Activity: Goal: Sleeping patterns will improve Outcome: Progressing   Problem: Coping: Goal: Ability to verbalize frustrations and anger appropriately will improve Outcome: Progressing   Problem: Activity: Goal: Interest or engagement in leisure activities will improve Outcome: Progressing   Problem: Health Behavior/Discharge Planning: Goal: Ability to make decisions will improve Outcome: Progressing   Problem: Safety: Goal: Ability to identify and utilize support systems that promote safety will improve Outcome: Progressing   Problem: Self-Concept: Goal: Will verbalize positive feelings about self Outcome: Progressing Goal: Level of anxiety will decrease Outcome: Progressing   Problem: Education: Goal: Knowledge of disease or condition will improve Outcome: Progressing   Problem: Physical Regulation: Goal: Complications related to the disease process, condition or treatment will be avoided or minimized Outcome: Progressing

## 2023-07-07 NOTE — Progress Notes (Signed)
[facility] MD Progress Note  07/07/2023 10:53 AM Nicholas Fletcher  MRN:  [id number]  Principal Problem: Alcohol use disorder Diagnosis: Principal Problem:   Alcohol use disorder  Reason for admission: Nicholas Fletcher is a 47 year old AA male with prior psychiatric diagnoses significant for long-term alcoholism and GAD who is involuntarily admitted to [facility] from [name], ED for aggressive behavior towards his wife.  In the context of alcohol intoxication.  After medical evaluation/stabilization & clearance, he was transferred to the [facility] for further psychiatric evaluation & treatments.  Yesterday the psychiatry team made the following recommendations:  Continue Sertraline 25 mg p.o. daily for anxiety / depression Continue trazodone tablets 50 mg p.o. nightly as needed for insomnia Completed potassium chloride 40 mEq x 1 dose only.  BMP: K+ 3.3   Today's assessment notes: On assessment today, the pt reports that his mood is less depressed.  Nicholas Fletcher is alert, calm, and oriented to person, place, time and situation.  Chart reviewed and findings shared with the treatment team and discussed with attending psychiatrist.  He appears to be minimizing his symptoms and requesting to know when he would be discharged.  This provider made patient aware that his symptoms need to be improved prior to discharge from the hospital.  More so, patient was involuntarily admitted by his wife due to aggressive behavior in the context of chronic alcohol use and intoxication.  Reports he has some business contract to follow-up in person tomorrow 07/08/2023.  Informed patient that the social workers can follow up on this appointment for him while he is in the hospital.  Vital signs reviewed without critical values.  CIWA score of 2 today.  No alcohol withdrawal symptoms observed during the evaluation. Reports that anxiety is at manageable level Nursing staff report patient sleeping over 7.25 hours last night and being restful.    Appetite is good Concentration is improving Energy level is adequate Denies suicidal thoughts and denies suicidal intent or plan.  Denies having any HI.  Denies having psychotic symptoms.   Denies having side effects to current psychiatric medications.   We discussed compliance to current medication regimen.  Discussed the following psychosocial stressors:  Cessation increased alcohol consumption and use of benzos as these adversely affect overall psychiatric and medical wellbeing.  Patient acknowledge instructions and plans to quit use of benzodiazepines and alcohol use.  Plans to attend outpatient counseling and therapy for alcohol use.  Collateral information: Patient's wife Nicholas Fletcher at [phone] called per patient permission for more information on patient.  Ms. Nicholas Fletcher reports same information as indicated in the IVC papers.  She added that patient does not need to be discharged prior to his symptoms resolving and patient should be  willing to attend outpatient counseling and therapy for his chronic alcohol dependency  Total Time spent with patient: 45 minutes  Past Psychiatric History: Previous Psych Diagnoses: Denies Prior inpatient treatment: Denies Current/prior outpatient treatment: Sertraline for anxiety.  Hydroxyzine causes hallucination Prior rehab hx: Denies Psychotherapy hx: Yes History of suicide: Denies History of homicide or aggression: Denies Psychiatric medication history: Denies Psychiatric medication compliance history: Patient has not been prescribed psychotropic meds Neuromodulation history: Denies Current Psychiatrist: Denies Current therapist: Denies   Past Medical History:  Past Medical History:  Diagnosis Date   Medical history non-contributory    Seizures (HCC)    pt reports having a seizure 05/2023    Past Surgical History:  Procedure Laterality Date   COLONOSCOPY WITH PROPOFOL N/A 09/26/2021  Procedure: COLONOSCOPY WITH PROPOFOL;   Surgeon: Regis Bill, MD;  Location: [facility] ENDOSCOPY;  Service: Endoscopy;  Laterality: N/A;   COLONOSCOPY WITH PROPOFOL N/A 09/28/2021   Procedure: COLONOSCOPY WITH PROPOFOL;  Surgeon: Regis Bill, MD;  Location: [facility] ENDOSCOPY;  Service: Endoscopy;  Laterality: N/A;   ESOPHAGOGASTRODUODENOSCOPY (EGD) WITH PROPOFOL N/A 09/26/2021   Procedure: ESOPHAGOGASTRODUODENOSCOPY (EGD) WITH PROPOFOL;  Surgeon: Regis Bill, MD;  Location: [facility] ENDOSCOPY;  Service: Endoscopy;  Laterality: N/A;   TONSILLECTOMY     Family History:  Family History  Family history unknown: Yes   Family Psychiatric  History: See H&P  Social History:  Social History   Substance and Sexual Activity  Alcohol Use Yes   Alcohol/week: 4.0 standard drinks of alcohol   Types: 1 Cans of beer, 3 Shots of liquor per week     Social History   Substance and Sexual Activity  Drug Use Not Currently    Social History   Socioeconomic History   Marital status: Married    Spouse name: Not on file   Number of children: Not on file   Years of education: 16   Highest education level: Associate degree: occupational, Scientist, product/process development, or vocational program  Occupational History   Not on file  Tobacco Use   Smoking status: Light Smoker    Types: Cigars   Smokeless tobacco: Never  Vaping Use   Vaping status: Every Day  Substance and Sexual Activity   Alcohol use: Yes    Alcohol/week: 4.0 standard drinks of alcohol    Types: 1 Cans of beer, 3 Shots of liquor per week   Drug use: Not Currently   Sexual activity: Not on file  Other Topics Concern   Not on file  Social History Narrative   Not on file   Social Determinants of Health   Financial Resource Strain: Not on file  Food Insecurity: No Food Insecurity (07/06/2023)   Hunger Vital Sign    Worried About Running Out of Food in the Last Year: Never true    Ran Out of Food in the Last Year: Never true  Transportation Needs: No Transportation Needs  (07/06/2023)   PRAPARE - Administrator, Civil Service (Medical): No    Lack of Transportation (Non-Medical): No  Physical Activity: Not on file  Stress: Not on file  Social Connections: Unknown (03/06/2022)   [facility] from [facility]   Social Network    Social Network: Not on file   Additional Social History:    Sleep: Good  Appetite:  Good  Current Medications: Current Facility-Administered Medications  Medication Dose Route Frequency Provider Last Rate Last Admin   acetaminophen (TYLENOL) tablet 650 mg  650 mg Oral Q6H PRN Bobbitt, Shalon E, NP   650 mg at 07/06/23 2050   alum & mag hydroxide-simeth (MAALOX/MYLANTA) 200-200-20 MG/5ML suspension 30 mL  30 mL Oral Q4H PRN Bobbitt, Shalon E, NP       diphenhydrAMINE (BENADRYL) capsule 50 mg  50 mg Oral TID PRN Bobbitt, Shalon E, NP       Or   diphenhydrAMINE (BENADRYL) injection 50 mg  50 mg Intramuscular TID PRN Bobbitt, Shalon E, NP       haloperidol (HALDOL) tablet 5 mg  5 mg Oral TID PRN Bobbitt, Shalon E, NP       Or   haloperidol lactate (HALDOL) injection 5 mg  5 mg Intramuscular TID PRN Bobbitt, Franchot Mimes, NP  hydrOXYzine (ATARAX) tablet 25 mg  25 mg Oral Q6H PRN Bobbitt, Shalon E, NP       loperamide (IMODIUM) capsule 2-4 mg  2-4 mg Oral PRN Bobbitt, Shalon E, NP       LORazepam (ATIVAN) tablet 2 mg  2 mg Oral TID PRN Bobbitt, Shalon E, NP       Or   LORazepam (ATIVAN) injection 2 mg  2 mg Intramuscular TID PRN Bobbitt, Shalon E, NP       LORazepam (ATIVAN) tablet 1 mg  1 mg Oral Q6H PRN Bobbitt, Shalon E, NP       LORazepam (ATIVAN) tablet 1 mg  1 mg Oral QID Bobbitt, Shalon E, NP   1 mg at 07/07/23 2202   Followed by   LORazepam (ATIVAN) tablet 1 mg  1 mg Oral TID Bobbitt, Shalon E, NP       Followed by   Melene Muller ON 07/08/2023] LORazepam (ATIVAN) tablet 1 mg  1 mg Oral BID Bobbitt, Shalon E, NP       Followed by   Melene Muller ON 07/10/2023] LORazepam (ATIVAN) tablet 1 mg  1 mg Oral Daily  Bobbitt, Shalon E, NP       magnesium hydroxide (MILK OF MAGNESIA) suspension 30 mL  30 mL Oral Daily PRN Bobbitt, Shalon E, NP       multivitamin with minerals tablet 1 tablet  1 tablet Oral Daily Bobbitt, Shalon E, NP   1 tablet at 07/07/23 0836   naphazoline-pheniramine (NAPHCON-A) 0.025-0.3 % ophthalmic solution 1 drop  1 drop Both Eyes QID PRN Sandria Mcenroe, Jesusita Oka, FNP       nicotine (NICODERM CQ - dosed in mg/24 hours) patch 14 mg  14 mg Transdermal Daily Enola Siebers, Jesusita Oka, FNP   14 mg at 07/07/23 0837   ondansetron (ZOFRAN-ODT) disintegrating tablet 4 mg  4 mg Oral Q6H PRN Bobbitt, Shalon E, NP       sertraline (ZOLOFT) tablet 25 mg  25 mg Oral Daily Amish Mintzer C, FNP   25 mg at 07/07/23 0836   thiamine (Vitamin B-1) tablet 100 mg  100 mg Oral Daily Bobbitt, Shalon E, NP   100 mg at 07/07/23 0836   thiamine (VITAMIN B1) injection 100 mg  100 mg Intramuscular Once Bobbitt, Shalon E, NP       traZODone (DESYREL) tablet 50 mg  50 mg Oral QHS PRN Bobbitt, Shalon E, NP   50 mg at 07/06/23 2049    Lab Results:  Results for orders placed or performed during the hospital encounter of 07/05/23 (from the past 48 hour(s))  Basic metabolic panel     Status: Abnormal   Collection Time: 07/07/23  6:42 AM  Result Value Ref Range   Sodium 135 135 - 145 mmol/L   Potassium 3.3 (L) 3.5 - 5.1 mmol/L   Chloride 97 (L) 98 - 111 mmol/L   CO2 28 22 - 32 mmol/L   Glucose, Bld 91 70 - 99 mg/dL    Comment: Glucose reference range applies only to samples taken after fasting for at least 8 hours.   BUN <5 (L) 6 - 20 mg/dL   Creatinine, Ser 5.42 0.61 - 1.24 mg/dL   Calcium 9.4 8.9 - 70.6 mg/dL   GFR, Estimated >23 >76 mL/min    Comment: (NOTE) Calculated using the CKD-EPI Creatinine Equation (2021)    Anion gap 10 5 - 15    Comment: Performed at [facility], [address] [place],  [place] [address]   Blood Alcohol level:  Lab Results  Component Value Date   ETH 238 (H) 07/04/2023   ETH <10  06/05/2023   Metabolic Disorder Labs: No results found for: "HGBA1C", "MPG" No results found for: "PROLACTIN" No results found for: "CHOL", "TRIG", "HDL", "CHOLHDL", "VLDL", "LDLCALC"  Physical Findings: AIMS:  , ,  ,  ,    CIWA:  CIWA-Ar Total: 2 COWS:     Musculoskeletal: Strength & Muscle Tone: within normal limits Gait & Station: normal Patient leans: N/A  Psychiatric Specialty Exam:  Presentation  General Appearance:  Casual; Appropriate for Environment; Fairly Groomed  Eye Contact: Good  Speech: Clear and Coherent; Normal Rate  Speech Volume: Normal  Handedness: Right  Mood and Affect  Mood: Anxious; Depressed  Affect: Congruent  Thought Process  Thought Processes: Coherent; Goal Directed  Descriptions of Associations:Intact  Orientation:Full (Time, Place and Person)  Thought Content:Logical (However minimizing symptoms)  History of Schizophrenia/Schizoaffective disorder:No data recorded Duration of Psychotic Symptoms:No data recorded Hallucinations:Hallucinations: None  Ideas of Reference:None  Suicidal Thoughts:Suicidal Thoughts: No  Homicidal Thoughts:Homicidal Thoughts: No  Sensorium  Memory: Immediate Good; Recent Good  Judgment: Fair  Insight: Present  Executive Functions  Concentration: Good  Attention Span: Good  Recall: Fair  Fund of Knowledge: Fair  Language: Good  Psychomotor Activity  Psychomotor Activity: Psychomotor Activity: Normal  Assets  Assets: Communication Skills; Desire for Improvement; Housing; Physical Health; Resilience; Social Support  Sleep  Sleep: Sleep: Good Number of Hours of Sleep: 7.25  Physical Exam: Physical Exam Vitals and nursing note reviewed.  HENT:     Head: Normocephalic.     Nose: Nose normal.     Mouth/Throat:     Mouth: Mucous membranes are moist.  Eyes:     Extraocular Movements: Extraocular movements intact.  Cardiovascular:     Rate and Rhythm: Normal  rate.     Pulses: Normal pulses.  Pulmonary:     Effort: Pulmonary effort is normal.  Abdominal:     Comments: Deferred  Genitourinary:    Comments: Deferred Musculoskeletal:        General: Normal range of motion.     Cervical back: Normal range of motion.  Skin:    General: Skin is warm.  Neurological:     General: No focal deficit present.     Mental Status: He is alert and oriented to person, place, and time.  Psychiatric:        Mood and Affect: Mood normal.        Behavior: Behavior normal.        Thought Content: Thought content normal.    Review of Systems  Constitutional:  Negative for chills and fever.  HENT:  Negative for sore throat.   Eyes:  Positive for redness. Negative for blurred vision.       Redness to both eyes due to allergies.  Visine eyedrops ordered 4 times daily as needed.  Respiratory:  Negative for cough, shortness of breath and wheezing.   Cardiovascular:  Negative for chest pain and palpitations.  Gastrointestinal:  Negative for abdominal pain, heartburn, nausea and vomiting.  Genitourinary:  Negative for dysuria, frequency and urgency.  Musculoskeletal: Negative.   Skin:  Negative for itching and rash.  Neurological:  Negative for dizziness, tingling, tremors and headaches.  Endo/Heme/Allergies:        See allergy listing  Psychiatric/Behavioral:  Positive for depression and substance abuse. The patient is nervous/anxious.    Blood pressure 130/89, pulse  89, temperature 98.2 F (36.8 C), temperature source Oral, resp. rate 17, height 5\' 11"  (1.803 m), weight 79.8 kg, SpO2 100%. Body mass index is 24.55 kg/m.   Treatment Plan Summary: Daily contact with patient to assess and evaluate symptoms and progress in treatment and Medication management Physician Treatment Plan for Primary Diagnosis:  Assessment: Alcohol use disorder   Plans: Medications: Continue Sertraline 25 mg p.o. daily for anxiety / depression Continue trazodone tablets 50  mg p.o. nightly as needed for insomnia Completed potassium chloride 40 mEq x 1 dose only.  BMP: K+ 3.3   Ativan detox protocol with CIWA: See MAR   Agitation protocol: Benadryl capsule 50 mg p.o. or IM 3 times daily as needed agitation   Haldol tablets 5 mg po IM 3 times daily as needed agitation   Lorazepam tablet 2 mg p.o. or IM 3 times daily as needed agitation     Other PRN Medications -Acetaminophen 650 mg every 6 as needed/mild pain -Maalox 30 mL oral every 4 as needed/digestion -Magnesium hydroxide 30 mL daily as needed/mild constipation -- Nicotine gum 2 mg p.o. for smoking cessation   -- The risks/benefits/side-effects/alternatives to this medication were discussed in detail with the patient and time was given for questions. The patient consents to medication trial.  -- Metabolic profile and EKG monitoring obtained while on an atypical antipsychotic (BMI: Lipid Panel: HbgA1c: QTc:)  -- Encouraged patient to participate in unit milieu and in scheduled group therapies    Labs reviewed: CMP: Potassium 3.2, replace with 40 mEq of potassium chloride x 1 dose only; AST 66 high, total bilirubin 1.3 high.  CBC with differential: RBC 4.21 low, MCV 101 0.7 high, MCH 34.9 high.  BAL 238 high.  UDS positive for benzodiazepine.   Labs ordered: Lipid panel, hemoglobin A1c, TSH, BMP: K+ 3.3.     EKG: Last EKG 06/24/2022.   Safety and Monitoring: Voluntary admission to inpatient psychiatric unit for safety, stabilization and treatment Daily contact with patient to assess and evaluate symptoms and progress in treatment Patient's case to be discussed in multi-disciplinary team meeting Observation Level : q15 minute checks Vital signs: q12 hours Precautions: suicide, but pt currently verbally contracts for safety on unit    Discharge Planning: Social work and case management to assist with discharge planning and identification of hospital follow-up needs prior to discharge Estimated LOS: 5-7  days Discharge Concerns: Need to establish a safety plan; Medication compliance and effectiveness Discharge Goals: Return home with outpatient referrals for mental health follow-up including medication management/psychotherapy.   Long Term Goal(s): Improvement in symptoms so as ready for discharge   Short Term Goals: Ability to identify changes in lifestyle to reduce recurrence of condition will improve, Ability to verbalize feelings will improve, Ability to disclose and discuss suicidal ideas, Ability to demonstrate self-control will improve, Ability to identify and develop effective coping behaviors will improve, Ability to maintain clinical measurements within normal limits will improve, Compliance with prescribed medications will improve, and Ability to identify triggers associated with substance abuse/mental health issues will improve   Physician Treatment Plan for Secondary Diagnosis: Principal Problem:   Alcohol use disorder Depression   I certify that inpatient services furnished can reasonably be expected to improve the patient's condition.    Cecilie Lowers, FNP 07/07/2023, 10:53 AM

## 2023-07-07 NOTE — [facility] Group Notes (Signed)
[facility] Group Notes:  (Nursing/MHT/Case Management/Adjunct)  Date:  07/07/2023  Time:  2000  Type of Therapy:   Wrap up group  Participation Level:  Active  Participation Quality:  Appropriate, Attentive, Sharing, and Supportive  Affect:  Appropriate  Cognitive:  Appropriate  Insight:  Good  Engagement in Group:  Engaged  Modes of Intervention:  Clarification, Education, and Socialization  Summary of Progress/Problems: Positive thinking and self-care were discussed.   Nicholas Fletcher 07/07/2023, 8:50 PM

## 2023-07-07 NOTE — [facility] Counselor (Signed)
Adult Comprehensive Assessment  Patient ID: Nicholas Fletcher, male   DOB: 1976-10-02, 47 y.o.   MRN: [id number]  Information Source: Information source: Patient  Current Stressors:  Patient states their primary concerns and needs for treatment are:: Patient was IVC'd by spouse due to exhibitting agressive behavior and endorsing hallucinations. Patient reports, "Me and my wife had a mild argument and she became frustrated" Patient states their goals for this hospitilization and ongoing recovery are:: "I would like to establish outpatient services to monitor alcohol and anxiety symptoms' Educational / Learning stressors: None reported Employment / Job issues: None reported Family Relationships: Patient reports he and spouse are "new empty nesters" noting all three children are in college. Both of pt's parents are in long-term care placements Financial / Lack of resources (include bankruptcy): None reported Housing / Lack of housing: None reported Physical health (include injuries & life threatening diseases): Patient has hx of seizures. Pt unaware if they have been in correlation with previous alcohol use Social relationships: None reported Substance abuse: Patient reports hx of excessive alcohol use. States he was sober for nineteen days, prior to admission Bereavement / Loss: None reported  Living/Environment/Situation:  Living Arrangements: Spouse/significant other Living conditions (as described by patient or guardian): "Peaceful, fine" Who else lives in the home?: Spouse, Nicholas Fletcher How long has patient lived in current situation?: Patient recently sold their home before relocating to current residence What is atmosphere in current home: Comfortable  Family History:  Marital status: Married Number of Years Married: 21 What types of issues is patient dealing with in the relationship?: Patient reports difficulty communicating with spouse occassionally Additional relationship  information: None reported Are you sexually active?: Yes What is your sexual orientation?: Heterosexual Has your sexual activity been affected by drugs, alcohol, medication, or emotional stress?: Yes, emotional stress Does patient have children?: Yes How many children?: 3 How is patient's relationship with their children?: Patient endorses having a good relationship with all of his children  Childhood History:  By whom was/is the patient raised?: Both parents Additional childhood history information: Patient reports parents divorced when he was 66-3 years old; however, endorsed strong family support from maternal and paternal family Description of patient's relationship with caregiver when they were a child: Mom: "Great Dad: "Wonderful Patient's description of current relationship with people who raised him/her: "[place]. We all love each other" How were you disciplined when you got in trouble as a child/adolescent?: "I got whoopings" Does patient have siblings?: Yes Number of Siblings: 2 (Sisters) Did patient suffer any verbal/emotional/physical/sexual abuse as a child?: No Did patient suffer from severe childhood neglect?: No Has patient ever been sexually abused/assaulted/raped as an adolescent or adult?: No Was the patient ever a victim of a crime or a disaster?: No Witnessed domestic violence?: No Has patient been affected by domestic violence as an adult?: No  Education:  Highest grade of school patient has completed: Engineer, maintenance (IT) Currently a student?: No Learning disability?: No  Employment/Work Situation:   Employment Situation: Employed Where is Patient Currently Employed?: 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 [employer] How Long has Patient Been Employed?: Over 30 years Are You Satisfied With Your Job?: Yes Do You Work More Than One Job?: No Work Stressors: Patient reports projects have not been as consistent since the pandemic Patient's Job has Been Impacted by Current  Illness: Yes (Patient reports that some medicatications prescribed, such as, hydroxizine impacts functioning while working) Describe how Patient's Job has Been Impacted: Medications prescribed are not appropriate  to take during work hours What is the Longest Time Patient has Held a Job?: 33 years Where was the Patient Employed at that Time?: Holiday representative Has Patient ever Been in the [employer]?: No  Financial Resources:   Financial resources: Income from employment, Private insurance Does patient have a representative payee or guardian?: No  Alcohol/Substance Abuse:   What has been your use of drugs/alcohol within the last 12 months?: Alcohol-Patient reports hx of drinking beers and taking shots of liquor. Reports sobriety for the last nineteen days, prior to admission to hospital If attempted suicide, did drugs/alcohol play a role in this?: No Alcohol/Substance Abuse Treatment Hx: Denies past history Has alcohol/substance abuse ever caused legal problems?: No  Social Support System:   Patient's Community Support System: Good Describe Community Support System: Spouse and siblings Type of faith/religion: Retail buyer How does patient's faith help to cope with current illness?: "I meditate"  Leisure/Recreation:   Do You Have Hobbies?: Yes Leisure and Hobbies: Fishing and gardening  Strengths/Needs:   What is the patient's perception of their strengths?: "I am strategic and can work out different problems. I am also an Tree surgeon" Patient states they can use these personal strengths during their treatment to contribute to their recovery: Flexible thinking Patient states these barriers may affect/interfere with their treatment: "I need to work on patience" Patient states these barriers may affect their return to the community: None reported Other important information patient would like considered in planning for their treatment: Patient is interested in outpatient treatment for  mental health and/or alchol use  Discharge Plan:   Currently receiving community mental health services: No Patient states concerns and preferences for aftercare planning are: Patient is open to outpatient resources. States insurance barriers have prevented him from establishing services in the last 3 months Patient states they will know when they are safe and ready for discharge when: "I knew 2 days ago" Does patient have access to transportation?: Yes (Spouse will provide transportation at discharge) Does patient have financial barriers related to discharge medications?: No Patient description of barriers related to discharge medications: None reported Will patient be returning to same living situation after discharge?: Yes  Summary/Recommendations:   Summary and Recommendations (to be completed by the evaluator): Patient is a 47 year old African American male who was IVC'd by spouse due to pt exhibiting aggressive behavior and endorsing hallucinations. According to patient, he and spouse had a disagreement and she became frustrated, resulting in admission to hospital. Current stressors include attempt at sobriety from alcohol, both parents being placed in long-term care, in addition, to recently becoming empty nesters. Patient has been diagnosed with Alcohol Use Disorder and does not receive any services in the community. Patient is interested in outpatient therapy to assist with management of alcohol use. He receives strong support from family.  Bridgett Larsson, CSW  07/07/2023

## 2023-07-07 NOTE — Progress Notes (Signed)
   07/06/23 2100  Psych Admission Type (Psych Patients Only)  Admission Status Involuntary  Psychosocial Assessment  Patient Complaints Anxiety;Worrying  Eye Contact Fair  Facial Expression Anxious  Affect Appropriate to circumstance  Speech Logical/coherent  Interaction Assertive  Motor Activity Slow  Appearance/Hygiene Unremarkable  Behavior Characteristics Cooperative;Appropriate to situation  Mood Pleasant  Thought Process  Coherency WDL  Content WDL  Delusions None reported or observed  Perception WDL  Hallucination None reported or observed  Judgment Impaired  Confusion None  Danger to Self  Current suicidal ideation? Denies  Danger to Others  Danger to Others None reported or observed

## 2023-07-07 NOTE — Progress Notes (Signed)
Nursing Note: 0700-1900    Goal for today: "  Pt reports that he slept well last night, "the best I have in a very long time." Reports that appetite is good and is tolerating prescribed medication without side effects. Pt did not have his wife visit last night as he is hopeful to be discharged today. Denies A/V hallucinations and is able to verbally contract for safety.  CIWA 4 and 5 this shift, noted increase in trembling hands this shift. Wife coming to visit tonight, pt verbalized some anxiety about upcoming visit.  Pt. encouraged to verbalize needs and concerns, active listening and support provided.  Continued Q 15 minute safety checks.  Observed active participation in group settings.   07/07/23 0800  Psych Admission Type (Psych Patients Only)  Admission Status Involuntary  Psychosocial Assessment  Patient Complaints None  Eye Contact Fair  Facial Expression Anxious;Flat  Affect Appropriate to circumstance  Speech Logical/coherent  Interaction Assertive;Cautious  Motor Activity Slow  Appearance/Hygiene Unremarkable  Behavior Characteristics Cooperative;Appropriate to situation  Mood Euthymic  Thought Process  Coherency WDL  Content WDL  Delusions None reported or observed  Perception WDL  Hallucination None reported or observed  Judgment Impaired  Confusion None  Danger to Self  Current suicidal ideation? Denies  Danger to Others  Danger to Others None reported or observed

## 2023-07-08 ENCOUNTER — Encounter (HOSPITAL_COMMUNITY): Payer: Self-pay

## 2023-07-08 DIAGNOSIS — F1090 Alcohol use, unspecified, uncomplicated: Secondary | ICD-10-CM | POA: Diagnosis not present

## 2023-07-08 LAB — COMPREHENSIVE METABOLIC PANEL
ALT: 15 U/L (ref 0–44)
AST: 36 U/L (ref 15–41)
Albumin: 3.7 g/dL (ref 3.5–5.0)
Alkaline Phosphatase: 69 U/L (ref 38–126)
Anion gap: 10 (ref 5–15)
BUN: <5 mg/dL — ABNORMAL LOW (ref 6–20)
CO2: 25 mmol/L (ref 22–32)
Calcium: 9.1 mg/dL (ref 8.9–10.3)
Chloride: 100 mmol/L (ref 98–111)
Creatinine, Ser: 0.75 mg/dL (ref 0.61–1.24)
GFR, Estimated: >60 mL/min (ref >60)
Glucose, Bld: 94 mg/dL (ref 70–99)
Potassium: 3.6 mmol/L (ref 3.5–5.1)
Sodium: 135 mmol/L (ref 135–145)
Total Bilirubin: 1.4 mg/dL — ABNORMAL HIGH (ref 0.3–1.2)
Total Protein: 6.8 g/dL (ref 6.5–8.1)

## 2023-07-08 MED ORDER — SERTRALINE HCL 50 MG PO TABS
50.0000 mg | ORAL_TABLET | Freq: Every day | ORAL | Status: DC
  Administered 2023-07-09 – 2023-07-11 (×3): 50 mg via ORAL
  Filled 2023-07-08 (×5): qty 1

## 2023-07-08 NOTE — [facility] Group Notes (Signed)
Spirituality group facilitated by Kathleen Argue, [facility].  Group Description: Group focused on topic of hope. Patients participated in facilitated discussion around topic, connecting with one another around experiences and definitions for hope. Group members engaged with visual explorer photos, reflecting on what hope looks like for them today. Group engaged in discussion around how their definitions of hope are present today in hospital.  Modalities: Psycho-social ed, Adlerian, Narrative, MI  Patient Progress: Nicholas Fletcher attended group and actively engaged and participated in group conversation and activities.  His comments demonstrated good insight and he was supportive of peers.

## 2023-07-08 NOTE — Plan of Care (Signed)
Assumed care of patient this am, he presents A&Ox 4, guarded and evasive around tx. Issues, pleasant and cooperative, declined  am Ativan, denies ETOH withdrawal sx. Pt also denied having SI/HI, plans or intent and verbally committed to reach out to staff if he become overwhelmed with these thoughts. Pt focused on finding outpatient for aftercare, including AA  mtgs.Will continue to monitor for safety and support according to plan of care.

## 2023-07-08 NOTE — Group Note (Signed)
Occupational Therapy Group Note  Group Topic:Coping Skills  Group Date: 07/08/2023 Start Time: 1430 End Time: 1500 Facilitators: Ted Mcalpine, OT   Group Description: Group encouraged increased engagement and participation through discussion and activity focused on "Coping Ahead." Patients were split up into teams and selected a card from a stack of positive coping strategies. Patients were instructed to act out/charade the coping skill for other peers to guess and receive points for their team. Discussion followed with a focus on identifying additional positive coping strategies and patients shared how they were going to cope ahead over the weekend while continuing hospitalization stay.  Therapeutic Goal(s): Identify positive vs negative coping strategies. Identify coping skills to be used during hospitalization vs coping skills outside of hospital/at home Increase participation in therapeutic group environment and promote engagement in treatment   Participation Level: Engaged   Participation Quality: Independent   Behavior: Appropriate   Speech/Thought Process: Relevant   Affect/Mood: Appropriate   Insight: Fair   Judgement: Fair      Modes of Intervention: Education  Patient Response to Interventions:  Attentive   Plan: Continue to engage patient in OT groups 2 - 3x/week.  07/08/2023  Ted Mcalpine, OT  Kerrin Champagne, OT

## 2023-07-08 NOTE — [facility] Group Notes (Signed)
[facility] Group Notes:  (Nursing/MHT/Case Management/Adjunct) Adult Psychoeducational Group Note  Date:  07/08/2023 Time:  1:51 PM  Group Topic/Focus:  Emotional Education:   The focus of this group is to discuss what feelings/emotions are, and how they are experienced.  Participation Level:  Active  Participation Quality:  Appropriate  Affect:  Appropriate  Cognitive:  Appropriate  Insight: Appropriate  Engagement in Group:  Engaged  Modes of Intervention:  Discussion  Additional Comments:    Lucilla Edin 07/08/2023, 1:51 PM

## 2023-07-08 NOTE — Group Note (Signed)
Recreation Therapy Group Note   Group Topic:Team Building  Group Date: 07/08/2023 Start Time: 0935 End Time: 0955 Facilitators: Kasmira Cacioppo-McCall, LRT,CTRS Location: [address] Dayroom   Goal Area(s) Addresses:  Patient will effectively work with peer towards shared goal.  Patient will identify skills used to make activity successful.  Patient will identify how skills used during activity can be applied to reach post d/c goals.   Intervention: STEM Activity- Glass blower/designer  Group Description: Tallest Pharmacist, community. In teams of 5-6, patients were given 11 craft pipe cleaners. Using the materials provided, patients were instructed to compete again the opposing team(s) to build the tallest free-standing structure from floor level. The activity was timed; difficulty increased by Clinical research associate as Production designer, theatre/television/film continued.  Systematically resources were removed with additional directions for example, placing one arm behind their back, working in silence, and shape stipulations. LRT facilitated post-activity discussion reviewing team processes and necessary communication skills involved in completion. Patients were encouraged to reflect how the skills utilized, or not utilized, in this activity can be incorporated to positively impact support systems post discharge.   Affect/Mood: Appropriate   Participation Level: Engaged   Participation Quality: Independent   Behavior: Appropriate   Speech/Thought Process: Focused   Insight: Good   Judgement: Good   Modes of Intervention: STEM Activity   Patient Response to Interventions:  Engaged   Education Outcome:  In group clarification offered    Clinical Observations/Individualized Feedback: Pt attended and participated in group session. Pt worked well with peers in Geologist, engineering.    Plan: Continue to engage patient in RT group sessions 2-3x/week.   Satcha Storlie-McCall, LRT,CTRS 07/08/2023 12:03 PM

## 2023-07-08 NOTE — [facility] Group Notes (Signed)
[facility] Group Notes:  (Nursing/MHT/Case Management/Adjunct)  Date:  07/08/2023  Time:  9:11 PM  Type of Therapy:  Group Therapy  Participation Level:  Active  Participation Quality:  Appropriate  Affect:  Appropriate  Cognitive:  Appropriate  Insight:  Improving  Engagement in Group:  Improving  Modes of Intervention:  Discussion and Education  Summary of Progress/Problems: Pt attended AA group meeting Ninnie Fein E Kale Rondeau 07/08/2023, 9:11 PM

## 2023-07-08 NOTE — Plan of Care (Signed)

## 2023-07-08 NOTE — Group Note (Signed)
Date:  07/08/2023 Time:  9:36 AM  Group Topic/Focus:  Goals Group:   The focus of this group is to help patients establish daily goals to achieve during treatment and discuss how the patient can incorporate goal setting into their daily lives to aide in recovery. Orientation:   The focus of this group is to educate the patient on the purpose and policies of crisis stabilization and provide a format to answer questions about their admission.  The group details unit policies and expectations of patients while admitted.    Participation Level:  Active  Participation Quality:  Attentive  Affect:  Appropriate  Cognitive:  Appropriate  Insight: Appropriate  Engagement in Group:  Engaged  Modes of Intervention:  Discussion  Additional Comments:  Patient attended goals group and was attentive the duration of it. Patient's goal was to attend all groups.  Shaune Westfall T Lorraine Lax 07/08/2023, 9:36 AM

## 2023-07-08 NOTE — BH IP Treatment Plan (Signed)
Interdisciplinary Treatment and Diagnostic Plan Update  07/08/2023 Time of Session: 10:35AM Nicholas Fletcher MRN: [id number]  Principal Diagnosis: Alcohol use disorder  Secondary Diagnoses: Principal Problem:   Alcohol use disorder   Current Medications:  Current Facility-Administered Medications  Medication Dose Route Frequency Provider Last Rate Last Admin   acetaminophen (TYLENOL) tablet 650 mg  650 mg Oral Q6H PRN Bobbitt, Shalon E, NP   650 mg at 07/07/23 2109   alum & mag hydroxide-simeth (MAALOX/MYLANTA) 200-200-20 MG/5ML suspension 30 mL  30 mL Oral Q4H PRN Bobbitt, Shalon E, NP       diphenhydrAMINE (BENADRYL) capsule 50 mg  50 mg Oral TID PRN Bobbitt, Shalon E, NP       Or   diphenhydrAMINE (BENADRYL) injection 50 mg  50 mg Intramuscular TID PRN Bobbitt, Shalon E, NP       haloperidol (HALDOL) tablet 5 mg  5 mg Oral TID PRN Bobbitt, Shalon E, NP       Or   haloperidol lactate (HALDOL) injection 5 mg  5 mg Intramuscular TID PRN Bobbitt, Shalon E, NP       hydrOXYzine (ATARAX) tablet 25 mg  25 mg Oral Q6H PRN Bobbitt, Shalon E, NP       loperamide (IMODIUM) capsule 2-4 mg  2-4 mg Oral PRN Bobbitt, Shalon E, NP       LORazepam (ATIVAN) tablet 2 mg  2 mg Oral TID PRN Bobbitt, Shalon E, NP       Or   LORazepam (ATIVAN) injection 2 mg  2 mg Intramuscular TID PRN Bobbitt, Shalon E, NP       LORazepam (ATIVAN) tablet 1 mg  1 mg Oral Q6H PRN Bobbitt, Shalon E, NP       LORazepam (ATIVAN) tablet 1 mg  1 mg Oral TID Bobbitt, Shalon E, NP   1 mg at 07/07/23 1732   Followed by   LORazepam (ATIVAN) tablet 1 mg  1 mg Oral BID Bobbitt, Shalon E, NP   1 mg at 07/08/23 1257   Followed by   Melene Muller ON 07/10/2023] LORazepam (ATIVAN) tablet 1 mg  1 mg Oral Daily Bobbitt, Shalon E, NP       magnesium hydroxide (MILK OF MAGNESIA) suspension 30 mL  30 mL Oral Daily PRN Bobbitt, Shalon E, NP       multivitamin with minerals tablet 1 tablet  1 tablet Oral Daily Bobbitt, Shalon E, NP   1 tablet at  07/08/23 0900   naphazoline-pheniramine (NAPHCON-A) 0.025-0.3 % ophthalmic solution 1 drop  1 drop Both Eyes QID PRN Ntuen, Jesusita Oka, FNP   1 drop at 07/08/23 0926   nicotine (NICODERM CQ - dosed in mg/24 hours) patch 14 mg  14 mg Transdermal Daily Ntuen, Tina C, FNP   14 mg at 07/07/23 0837   ondansetron (ZOFRAN-ODT) disintegrating tablet 4 mg  4 mg Oral Q6H PRN Bobbitt, Shalon E, NP       sertraline (ZOLOFT) tablet 25 mg  25 mg Oral Daily Ntuen, Tina C, FNP   25 mg at 07/07/23 0836   thiamine (Vitamin B-1) tablet 100 mg  100 mg Oral Daily Bobbitt, Shalon E, NP   100 mg at 07/08/23 0900   thiamine (VITAMIN B1) injection 100 mg  100 mg Intramuscular Once Bobbitt, Shalon E, NP       traZODone (DESYREL) tablet 50 mg  50 mg Oral QHS PRN Bobbitt, Shalon E, NP   50 mg at 07/07/23 2109   PTA Medications:  Medications Prior to Admission  Medication Sig Dispense Refill Last Dose   ibuprofen (ADVIL) 600 MG tablet Take 600 mg by mouth every 6 (six) hours as needed for headache.   Unknown   omeprazole (PRILOSEC) 20 MG capsule Take 20 mg by mouth daily.   Unknown    Patient Stressors: Health problems   Substance abuse    Patient Strengths: Ability for insight  Average or above average intelligence  Work skills   Treatment Modalities: Medication Management, Group therapy, Case management,  1 to 1 session with clinician, Psychoeducation, Recreational therapy.   Physician Treatment Plan for Primary Diagnosis: Alcohol use disorder Long Term Goal(s): Improvement in symptoms so as ready for discharge   Short Term Goals: Ability to identify changes in lifestyle to reduce recurrence of condition will improve Ability to verbalize feelings will improve Ability to disclose and discuss suicidal ideas Ability to demonstrate self-control will improve Ability to identify and develop effective coping behaviors will improve Ability to maintain clinical measurements within normal limits will improve Compliance  with prescribed medications will improve Ability to identify triggers associated with substance abuse/mental health issues will improve  Medication Management: Evaluate patient's response, side effects, and tolerance of medication regimen.  Therapeutic Interventions: 1 to 1 sessions, Unit Group sessions and Medication administration.  Evaluation of Outcomes: Progressing  Physician Treatment Plan for Secondary Diagnosis: Principal Problem:   Alcohol use disorder  Long Term Goal(s): Improvement in symptoms so as ready for discharge   Short Term Goals: Ability to identify changes in lifestyle to reduce recurrence of condition will improve Ability to verbalize feelings will improve Ability to disclose and discuss suicidal ideas Ability to demonstrate self-control will improve Ability to identify and develop effective coping behaviors will improve Ability to maintain clinical measurements within normal limits will improve Compliance with prescribed medications will improve Ability to identify triggers associated with substance abuse/mental health issues will improve     Medication Management: Evaluate patient's response, side effects, and tolerance of medication regimen.  Therapeutic Interventions: 1 to 1 sessions, Unit Group sessions and Medication administration.  Evaluation of Outcomes: Progressing   RN Treatment Plan for Primary Diagnosis: Alcohol use disorder Long Term Goal(s): Knowledge of disease and therapeutic regimen to maintain health will improve  Short Term Goals: Ability to remain free from injury will improve, Ability to verbalize frustration and anger appropriately will improve, Ability to participate in decision making will improve, Ability to verbalize feelings will improve, Ability to identify and develop effective coping behaviors will improve, and Compliance with prescribed medications will improve  Medication Management: RN will administer medications as ordered by  provider, will assess and evaluate patient's response and provide education to patient for prescribed medication. RN will report any adverse and/or side effects to prescribing provider.  Therapeutic Interventions: 1 on 1 counseling sessions, Psychoeducation, Medication administration, Evaluate responses to treatment, Monitor vital signs and CBGs as ordered, Perform/monitor CIWA, COWS, AIMS and Fall Risk screenings as ordered, Perform wound care treatments as ordered.  Evaluation of Outcomes: Progressing   LCSW Treatment Plan for Primary Diagnosis: Alcohol use disorder Long Term Goal(s): Safe transition to appropriate next level of care at discharge, Engage patient in therapeutic group addressing interpersonal concerns.  Short Term Goals: Engage patient in aftercare planning with referrals and resources, Increase social support, Increase emotional regulation, Facilitate acceptance of mental health diagnosis and concerns, Identify triggers associated with mental health/substance abuse issues, and Increase skills for wellness and recovery  Therapeutic Interventions: Assess for all discharge  needs, 1 to 1 time with Child psychotherapist, Explore available resources and support systems, Assess for adequacy in community support network, Educate family and significant other(s) on suicide prevention, Complete Psychosocial Assessment, Interpersonal group therapy.  Evaluation of Outcomes: Progressing   Progress in Treatment: Attending groups: Yes. Participating in groups: Yes. Taking medication as prescribed: Yes. Toleration medication: Yes. Family/Significant other contact made: Yes, individual(s) contacted:  Christop Rubey (Spouse) [phone] Patient understands diagnosis: Yes. Discussing patient identified problems/goals with staff: Yes. Medical problems stabilized or resolved: Yes. Denies suicidal/homicidal ideation: Yes. Issues/concerns per patient self-inventory: No.  New problem(s) identified:  No, Describe:  None reported  New Short Term/Long Term Goal(s):medication stabilization, elimination of SI thoughts, development of comprehensive mental wellness plan.    Patient Goals:  "get some outside resources and go back home"  Discharge Plan or Barriers: Patient recently admitted. CSW will continue to follow and assess for appropriate referrals and possible discharge planning.    Reason for Continuation of Hospitalization: Aggression Hallucinations Medication stabilization  Estimated Length of Stay:5-7 days  Last 3 [place] Suicide Severity Risk Score: Flowsheet Row Ad[facility] (Current) [facility] from 07/04/2023 in [facility] ED from 06/05/2023 in [facility] Emergency Department at [address] [facility]  C-SSRS RISK CATEGORY No Risk No Risk No Risk       Last PHQ 2/9 Scores:     No data to display          Scribe for Treatment Team: Izell [place], [place] 07/08/2023 1:39 PM

## 2023-07-08 NOTE — Progress Notes (Signed)
   07/08/23 2242  Psych Admission Type (Psych Patients Only)  Admission Status Involuntary  Psychosocial Assessment  Patient Complaints None  Eye Contact Fair  Facial Expression Animated  Affect Appropriate to circumstance  Speech Logical/coherent  Interaction Assertive  Motor Activity Slow  Appearance/Hygiene Unremarkable  Behavior Characteristics Appropriate to situation  Mood Pleasant  Thought Process  Coherency WDL  Content WDL  Delusions None reported or observed  Perception WDL  Hallucination None reported or observed  Judgment WDL  Confusion None  Danger to Self  Current suicidal ideation? Denies  Danger to Others  Danger to Others None reported or observed

## 2023-07-08 NOTE — Progress Notes (Signed)
[facility] MD Progress Note  07/08/2023 4:01 PM SHAKER SARKER  MRN:  [id number]  Principal Problem: Alcohol use disorder Diagnosis: Principal Problem:   Alcohol use disorder  Reason for admission: Shavon A. Choudhary is a 47 year old AA male with prior psychiatric diagnoses significant for long-term alcoholism and GAD who is involuntarily admitted to [facility] from [name], ED for aggressive behavior towards his wife.  In the context of alcohol intoxication.  After medical evaluation/stabilization & clearance, he was transferred to the [facility] for further psychiatric evaluation & treatments.  Yesterday the psychiatry team made the following recommendations:  Increase sertraline from 25 mg to 50 mg p.o. daily for anxiety / depression Continue trazodone tablets 50 mg p.o. nightly as needed for insomnia 07/07/23 Completed potassium chloride 40 meq x 1 dose only. CMP: K+ 3.6, BUN less than 5, bilirubin 1.3 high 07/06/23 Completed potassium chloride 40 mEq x 1 dose only.  BMP: K+ 3.3   Today's assessment notes: Finlee reports that his mood is less depressed, however mood appears depressed with sad affect.  Zoloft increased from 25 mg to 50 mg p.o. daily for depression.  He is alert, calm, and oriented to person, place, time and situation.  Chart reviewed and findings shared with the treatment team and discussed with attending psychiatrist.  He appears to be minimizing his symptoms and requesting to know when he would be discharged.  This provider made patient aware that his symptoms need to be improved prior to discharge from the hospital.  Vital signs reviewed without critical values.  CIWA score of 1 today.  No alcohol withdrawal symptoms observed during the evaluation. As per nursing staff, patient declined am Ativan, denies ETOH withdrawal sx. Pt also denied having SI/HI, or AVH, plans or intent and verbally committed to reach out to staff if he become overwhelmed with these thoughts. Pt focused on finding  outpatient for aftercare, including AA mtgs.Will continue to monitor for safety and support according to plan of care.  Reports that anxiety is at manageable level Nursing staff report patient sleeping over 7 hours last night and being restful.   Appetite is good Concentration is improving Energy level is adequate Denies suicidal thoughts and denies suicidal intent or plan.  Denies having any HI.  Denies having psychotic symptoms.   Denies having side effects to current psychiatric medications.   We discussed compliance to current medication regimen, and adjusting Zoloft from 25 mg to 50 mg p.o. daily for depression.  Patient in agreement with medication adjustment.  Discussed the following psychosocial stressors:  Cessation increased alcohol consumption and use of benzos as these adversely affect overall psychiatric and medical wellbeing.  Patient acknowledge instructions and plans to quit use of benzodiazepines and alcohol use.  Plans to attend outpatient counseling and therapy for alcohol use.  07/07/23 Collateral information: Patient's wife Kroy Saffle at [phone] called per patient permission for more information on patient.  Ms. Suzette Battiest reports same information as indicated in the IVC papers.  She added that patient does not need to be discharged prior to his symptoms resolving and patient should be  willing to attend outpatient counseling and therapy for his chronic alcohol dependency  Total Time spent with patient: 45 minutes  Past Psychiatric History: Previous Psych Diagnoses: Denies Prior inpatient treatment: Denies Current/prior outpatient treatment: Sertraline for anxiety.  Hydroxyzine causes hallucination Prior rehab hx: Denies Psychotherapy hx: Yes History of suicide: Denies History of homicide or aggression: Denies Psychiatric medication history: Denies Psychiatric medication compliance  history: Patient has not been prescribed psychotropic meds Neuromodulation  history: Denies Current Psychiatrist: Denies Current therapist: Denies   Past Medical History:  Past Medical History:  Diagnosis Date   Medical history non-contributory    Seizures (HCC)    pt reports having a seizure 05/2023    Past Surgical History:  Procedure Laterality Date   COLONOSCOPY WITH PROPOFOL N/A 09/26/2021   Procedure: COLONOSCOPY WITH PROPOFOL;  Surgeon: Regis Bill, MD;  Location: [facility] ENDOSCOPY;  Service: Endoscopy;  Laterality: N/A;   COLONOSCOPY WITH PROPOFOL N/A 09/28/2021   Procedure: COLONOSCOPY WITH PROPOFOL;  Surgeon: Regis Bill, MD;  Location: [facility] ENDOSCOPY;  Service: Endoscopy;  Laterality: N/A;   ESOPHAGOGASTRODUODENOSCOPY (EGD) WITH PROPOFOL N/A 09/26/2021   Procedure: ESOPHAGOGASTRODUODENOSCOPY (EGD) WITH PROPOFOL;  Surgeon: Regis Bill, MD;  Location: [facility] ENDOSCOPY;  Service: Endoscopy;  Laterality: N/A;   TONSILLECTOMY     Family History:  Family History  Family history unknown: Yes   Family Psychiatric  History: See H&P  Social History:  Social History   Substance and Sexual Activity  Alcohol Use Yes   Alcohol/week: 4.0 standard drinks of alcohol   Types: 1 Cans of beer, 3 Shots of liquor per week     Social History   Substance and Sexual Activity  Drug Use Not Currently    Social History   Socioeconomic History   Marital status: Married    Spouse name: Not on file   Number of children: Not on file   Years of education: 16   Highest education level: Associate degree: occupational, Scientist, product/process development, or vocational program  Occupational History   Not on file  Tobacco Use   Smoking status: Light Smoker    Types: Cigars   Smokeless tobacco: Never  Vaping Use   Vaping status: Every Day  Substance and Sexual Activity   Alcohol use: Yes    Alcohol/week: 4.0 standard drinks of alcohol    Types: 1 Cans of beer, 3 Shots of liquor per week   Drug use: Not Currently   Sexual activity: Not on file  Other Topics Concern    Not on file  Social History Narrative   Not on file   Social Determinants of Health   Financial Resource Strain: Not on file  Food Insecurity: No Food Insecurity (07/06/2023)   Hunger Vital Sign    Worried About Running Out of Food in the Last Year: Never true    Ran Out of Food in the Last Year: Never true  Transportation Needs: No Transportation Needs (07/06/2023)   PRAPARE - Administrator, Civil Service (Medical): No    Lack of Transportation (Non-Medical): No  Physical Activity: Not on file  Stress: Not on file  Social Connections: Unknown (03/06/2022)   [facility] from [facility]   Social Network    Social Network: Not on file   Additional Social History:    Sleep: Good  Appetite:  Good  Current Medications: Current Facility-Administered Medications  Medication Dose Route Frequency Provider Last Rate Last Admin   acetaminophen (TYLENOL) tablet 650 mg  650 mg Oral Q6H PRN Bobbitt, Shalon E, NP   650 mg at 07/07/23 2109   alum & mag hydroxide-simeth (MAALOX/MYLANTA) 200-200-20 MG/5ML suspension 30 mL  30 mL Oral Q4H PRN Bobbitt, Shalon E, NP       diphenhydrAMINE (BENADRYL) capsule 50 mg  50 mg Oral TID PRN Bobbitt, Franchot Mimes, NP       Or  diphenhydrAMINE (BENADRYL) injection 50 mg  50 mg Intramuscular TID PRN Bobbitt, Shalon E, NP       haloperidol (HALDOL) tablet 5 mg  5 mg Oral TID PRN Bobbitt, Shalon E, NP       Or   haloperidol lactate (HALDOL) injection 5 mg  5 mg Intramuscular TID PRN Bobbitt, Shalon E, NP       hydrOXYzine (ATARAX) tablet 25 mg  25 mg Oral Q6H PRN Bobbitt, Shalon E, NP       loperamide (IMODIUM) capsule 2-4 mg  2-4 mg Oral PRN Bobbitt, Shalon E, NP       LORazepam (ATIVAN) tablet 2 mg  2 mg Oral TID PRN Bobbitt, Shalon E, NP       Or   LORazepam (ATIVAN) injection 2 mg  2 mg Intramuscular TID PRN Bobbitt, Shalon E, NP       LORazepam (ATIVAN) tablet 1 mg  1 mg Oral Q6H PRN Bobbitt, Shalon E, NP       LORazepam (ATIVAN)  tablet 1 mg  1 mg Oral TID Bobbitt, Shalon E, NP   1 mg at 07/07/23 1732   Followed by   LORazepam (ATIVAN) tablet 1 mg  1 mg Oral BID Bobbitt, Shalon E, NP   1 mg at 07/08/23 1257   Followed by   Melene Muller ON 07/10/2023] LORazepam (ATIVAN) tablet 1 mg  1 mg Oral Daily Bobbitt, Shalon E, NP       magnesium hydroxide (MILK OF MAGNESIA) suspension 30 mL  30 mL Oral Daily PRN Bobbitt, Shalon E, NP       multivitamin with minerals tablet 1 tablet  1 tablet Oral Daily Bobbitt, Shalon E, NP   1 tablet at 07/08/23 0900   naphazoline-pheniramine (NAPHCON-A) 0.025-0.3 % ophthalmic solution 1 drop  1 drop Both Eyes QID PRN Kourtland Coopman, Jesusita Oka, FNP   1 drop at 07/08/23 0926   nicotine (NICODERM CQ - dosed in mg/24 hours) patch 14 mg  14 mg Transdermal Daily Janmichael Giraud, Jesusita Oka, FNP   14 mg at 07/07/23 0837   ondansetron (ZOFRAN-ODT) disintegrating tablet 4 mg  4 mg Oral Q6H PRN Bobbitt, Shalon E, NP       sertraline (ZOLOFT) tablet 25 mg  25 mg Oral Daily Analycia Khokhar, Jesusita Oka, FNP   25 mg at 07/07/23 0836   thiamine (Vitamin B-1) tablet 100 mg  100 mg Oral Daily Bobbitt, Shalon E, NP   100 mg at 07/08/23 0900   thiamine (VITAMIN B1) injection 100 mg  100 mg Intramuscular Once Bobbitt, Shalon E, NP       traZODone (DESYREL) tablet 50 mg  50 mg Oral QHS PRN Bobbitt, Shalon E, NP   50 mg at 07/07/23 2109    Lab Results:  Results for orders placed or performed during the hospital encounter of 07/05/23 (from the past 48 hour(s))  Basic metabolic panel     Status: Abnormal   Collection Time: 07/07/23  6:42 AM  Result Value Ref Range   Sodium 135 135 - 145 mmol/L   Potassium 3.3 (L) 3.5 - 5.1 mmol/L   Chloride 97 (L) 98 - 111 mmol/L   CO2 28 22 - 32 mmol/L   Glucose, Bld 91 70 - 99 mg/dL    Comment: Glucose reference range applies only to samples taken after fasting for at least 8 hours.   BUN <5 (L) 6 - 20 mg/dL   Creatinine, Ser 8.65 0.61 - 1.24 mg/dL   Calcium 9.4 8.9 -  10.3 mg/dL   GFR, Estimated >78 >29 mL/min     Comment: (NOTE) Calculated using the CKD-EPI Creatinine Equation (2021)    Anion gap 10 5 - 15    Comment: Performed at [facility], [address] [place], [place] [address]  Comprehensive metabolic panel     Status: Abnormal   Collection Time: 07/08/23  6:45 AM  Result Value Ref Range   Sodium 135 135 - 145 mmol/L   Potassium 3.6 3.5 - 5.1 mmol/L   Chloride 100 98 - 111 mmol/L   CO2 25 22 - 32 mmol/L   Glucose, Bld 94 70 - 99 mg/dL    Comment: Glucose reference range applies only to samples taken after fasting for at least 8 hours.   BUN <5 (L) 6 - 20 mg/dL   Creatinine, Ser 0.86 0.61 - 1.24 mg/dL   Calcium 9.1 8.9 - 57.8 mg/dL   Total Protein 6.8 6.5 - 8.1 g/dL   Albumin 3.7 3.5 - 5.0 g/dL   AST 36 15 - 41 U/L   ALT 15 0 - 44 U/L   Alkaline Phosphatase 69 38 - 126 U/L   Total Bilirubin 1.4 (H) 0.3 - 1.2 mg/dL   GFR, Estimated >46 >96 mL/min    Comment: (NOTE) Calculated using the CKD-EPI Creatinine Equation (2021)    Anion gap 10 5 - 15    Comment: Performed at [facility], [address], [place], [place] [address]   Blood Alcohol level:  Lab Results  Component Value Date   ETH 238 (H) 07/04/2023   ETH <10 06/05/2023   Metabolic Disorder Labs: No results found for: "HGBA1C", "MPG" No results found for: "PROLACTIN" No results found for: "CHOL", "TRIG", "HDL", "CHOLHDL", "VLDL", "LDLCALC"  Physical Findings: AIMS:  , ,  ,  ,    CIWA:  CIWA-Ar Total: 0 COWS:     Musculoskeletal: Strength & Muscle Tone: within normal limits Gait & Station: normal Patient leans: N/A  Psychiatric Specialty Exam:  Presentation  General Appearance:  Appropriate for Environment; Casual; Fairly Groomed  Eye Contact: Good  Speech: Clear and Coherent  Speech Volume: Normal  Handedness: Right  Mood and Affect  Mood: Euthymic  Affect: Congruent  Thought Process  Thought Processes: Coherent; Goal Directed  Descriptions of  Associations:Intact  Orientation:Full (Time, Place and Person)  Thought Content:Logical  History of Schizophrenia/Schizoaffective disorder:No data recorded Duration of Psychotic Symptoms:No data recorded Hallucinations:Hallucinations: None  Ideas of Reference:None  Suicidal Thoughts:Suicidal Thoughts: No  Homicidal Thoughts:Homicidal Thoughts: No  Sensorium  Memory: Immediate Good; Recent Good  Judgment: Fair  Insight: Fair  Art therapist  Concentration: Good  Attention Span: Good  Recall: Fair  Fund of Knowledge: Fair  Language: Good  Psychomotor Activity  Psychomotor Activity: Psychomotor Activity: Normal  Assets  Assets: Communication Skills; Desire for Improvement; Physical Health; Resilience; Social Support; Housing  Sleep  Sleep: Sleep: Good Number of Hours of Sleep: 7  Physical Exam: Physical Exam Vitals and nursing note reviewed.  HENT:     Head: Normocephalic.     Nose: Nose normal.     Mouth/Throat:     Mouth: Mucous membranes are moist.  Eyes:     Extraocular Movements: Extraocular movements intact.  Cardiovascular:     Rate and Rhythm: Normal rate.     Pulses: Normal pulses.  Pulmonary:     Effort: Pulmonary effort is normal.  Abdominal:     Comments: Deferred  Genitourinary:    Comments:  Deferred Musculoskeletal:        General: Normal range of motion.     Cervical back: Normal range of motion.  Skin:    General: Skin is warm.  Neurological:     General: No focal deficit present.     Mental Status: He is alert and oriented to person, place, and time.  Psychiatric:        Mood and Affect: Mood normal.        Behavior: Behavior normal.        Thought Content: Thought content normal.    Review of Systems  Constitutional:  Negative for chills and fever.  HENT:  Negative for sore throat.   Eyes:  Negative for blurred vision.       Redness to both eyes due to allergies.  Visine eyedrops ordered 4 times daily as  needed.  Respiratory:  Negative for cough, shortness of breath and wheezing.   Cardiovascular:  Negative for chest pain and palpitations.  Gastrointestinal:  Negative for abdominal pain, heartburn, nausea and vomiting.  Genitourinary:  Negative for dysuria, frequency and urgency.  Musculoskeletal: Negative.   Skin:  Negative for itching and rash.  Neurological:  Negative for dizziness, tingling, tremors and headaches.  Endo/Heme/Allergies:        See allergy listing  Psychiatric/Behavioral:  Positive for depression. The patient is nervous/anxious.    Blood pressure (!) 123/96, pulse 91, temperature 98.2 F (36.8 C), temperature source Oral, resp. rate 17, height 5\' 11"  (1.803 m), weight 79.8 kg, SpO2 99%. Body mass index is 24.55 kg/m.   Treatment Plan Summary: Daily contact with patient to assess and evaluate symptoms and progress in treatment and Medication management Physician Treatment Plan for Primary Diagnosis:  Assessment: Alcohol use disorder   Plans: Medications: Increase sertraline from 25 mg to 50 mg p.o. daily for anxiety / depression Continue trazodone tablets 50 mg p.o. nightly as needed for insomnia 07/08/23 Completed potassium chloride 40 meq x 1 dose only. CMP: K+ 3.6, BUN less than 5, bilirubin 1.3 high 07/06/23 Completed potassium chloride 40 mEq x 1 dose only.  BMP: K+ 3.3   Ativan detox protocol with CIWA: See MAR   Agitation protocol: Benadryl capsule 50 mg p.o. or IM 3 times daily as needed agitation   Haldol tablets 5 mg po IM 3 times daily as needed agitation   Lorazepam tablet 2 mg p.o. or IM 3 times daily as needed agitation     Other PRN Medications -Acetaminophen 650 mg every 6 as needed/mild pain -Maalox 30 mL oral every 4 as needed/digestion -Magnesium hydroxide 30 mL daily as needed/mild constipation -- Nicotine gum 2 mg p.o. for smoking cessation   -- The risks/benefits/side-effects/alternatives to this medication were discussed in detail with  the patient and time was given for questions. The patient consents to medication trial.  -- Metabolic profile and EKG monitoring obtained while on an atypical antipsychotic (BMI: Lipid Panel: HbgA1c: QTc:)  -- Encouraged patient to participate in unit milieu and in scheduled group therapies    Labs reviewed: CMP: Potassium 3.2, replace with 40 mEq of potassium chloride x 1 dose only; AST 66 high, total bilirubin 1.3 high.  CBC with differential: RBC 4.21 low, MCV 101 0.7 high, MCH 34.9 high.  BAL 238 high.  UDS positive for benzodiazepine.   07/06/23 Labs ordered: Lipid panel, hemoglobin A1c, TSH, BMP: K+ 3.3.   07/08/23 CMP: Completed potassium chloride 40 meq x 1 dose only ON 07/07/23, CMP: K+ 3.6, BUN  less than 5, bilirubin 1.3 high  EKG: Last EKG 06/24/2022.   Safety and Monitoring: Voluntary admission to inpatient psychiatric unit for safety, stabilization and treatment Daily contact with patient to assess and evaluate symptoms and progress in treatment Patient's case to be discussed in multi-disciplinary team meeting Observation Level : q15 minute checks Vital signs: q12 hours Precautions: suicide, but pt currently verbally contracts for safety on unit    Discharge Planning: Social work and case management to assist with discharge planning and identification of hospital follow-up needs prior to discharge Estimated LOS: 5-7 days Discharge Concerns: Need to establish a safety plan; Medication compliance and effectiveness Discharge Goals: Return home with outpatient referrals for mental health follow-up including medication management/psychotherapy.   Long Term Goal(s): Improvement in symptoms so as ready for discharge   Short Term Goals: Ability to identify changes in lifestyle to reduce recurrence of condition will improve, Ability to verbalize feelings will improve, Ability to disclose and discuss suicidal ideas, Ability to demonstrate self-control will improve, Ability to identify and  develop effective coping behaviors will improve, Ability to maintain clinical measurements within normal limits will improve, Compliance with prescribed medications will improve, and Ability to identify triggers associated with substance abuse/mental health issues will improve   Physician Treatment Plan for Secondary Diagnosis: Principal Problem:   Alcohol use disorder Depression   I certify that inpatient services furnished can reasonably be expected to improve the patient's condition.    Cecilie Lowers, FNP 07/08/2023, 4:01 PM Patient ID: Manual Meier, male   DOB: 01/07/1976, 47 y.o.   MRN: [id number]

## 2023-07-08 NOTE — Progress Notes (Signed)
   07/08/23 0524  15 Minute Checks  Location Bedroom  Visual Appearance Calm  Behavior Sleeping  Sleep (Behavioral Health Patients Only)  Calculate sleep? (Click Yes once per 24 hr at 0600 safety check) Yes  Documented sleep last 24 hours 7.25

## 2023-07-08 NOTE — Plan of Care (Signed)
Problem: Education: Goal: Verbalization of understanding the information provided will improve Outcome: Progressing   Problem: Activity: Goal: Interest or engagement in activities will improve Outcome: Progressing

## 2023-07-09 DIAGNOSIS — F1090 Alcohol use, unspecified, uncomplicated: Secondary | ICD-10-CM | POA: Diagnosis not present

## 2023-07-09 NOTE — Group Note (Signed)
LCSW Group Therapy Note   Group Date: 07/09/2023 Start Time: 1100 End Time: 1200   Type of Therapy and Topic:  Group Therapy: Boundaries  Participation Level:  Did Not Attend  Description of Group: This group will address the use of boundaries in their personal lives. Patients will explore why boundaries are important, the difference between healthy and unhealthy boundaries, and negative and postive outcomes of different boundaries and will look at how boundaries can be crossed.  Patients will be encouraged to identify current boundaries in their own lives and identify what kind of boundary is being set. Facilitators will guide patients in utilizing problem-solving interventions to address and correct types boundaries being used and to address when no boundary is being used. Understanding and applying boundaries will be explored and addressed for obtaining and maintaining a balanced life. Patients will be encouraged to explore ways to assertively make their boundaries and needs known to significant others in their lives, using other group members and facilitator for role play, support, and feedback.  Therapeutic Goals:  1.  Patient will identify areas in their life where setting clear boundaries could be  used to improve their life.  2.  Patient will identify signs/triggers that a boundary is not being respected. 3.  Patient will identify two ways to set boundaries in order to achieve balance in  their lives: 4.  Patient will demonstrate ability to communicate their needs and set boundaries  through discussion and/or role plays  Therapeutic Modalities:   Cognitive Behavioral Therapy Solution-Focused Therapy  Ane Payment, LCSW 07/09/2023  1:14 PM

## 2023-07-09 NOTE — Progress Notes (Signed)
   07/09/23 1000  Psych Admission Type (Psych Patients Only)  Admission Status Involuntary  Psychosocial Assessment  Patient Complaints None  Eye Contact Fair  Facial Expression Animated  Affect Appropriate to circumstance  Speech Logical/coherent  Interaction Assertive  Motor Activity Slow  Appearance/Hygiene Unremarkable  Behavior Characteristics Cooperative;Appropriate to situation  Mood Pleasant  Thought Process  Coherency WDL  Content WDL  Delusions None reported or observed  Perception WDL  Hallucination None reported or observed  Judgment WDL  Confusion None  Danger to Self  Current suicidal ideation? Denies  Danger to Others  Danger to Others None reported or observed

## 2023-07-09 NOTE — [facility] Group Notes (Signed)
[facility] Group Notes:  (Nursing/MHT/Case Management/Adjunct)  Date:  07/09/2023  Time:  9:23 AM  Type of Therapy:  Group Topic/ Focus: Goals Group: The focus of this group is to help patients establish daily goals to achieve during treatment and discuss how the patient can incorporate goal setting into their daily lives to aide in recovery.   Participation Level:  Did Not Attend  Summary of Progress/Problems:  Patient did not attend goals group today. Patient was encouraged but refused.   Daneil Dan 07/09/2023, 9:23 AM

## 2023-07-09 NOTE — Group Note (Signed)
Date:  07/09/2023 Time:  1:20 AM  Group Topic/Focus:  Wrap-Up Group:   The focus of this group is to help patients review their daily goal of treatment and discuss progress on daily workbooks.    Participation Level:  Did Not Attend  Participation Quality:   N/A  Affect:   N/A  Cognitive:   N/A  Insight: None  Engagement in Group:  Distracting and Engaged  Modes of Intervention:  Activity and Socialization  Additional Comments:  Patient did not attend group.   Kennieth Francois 07/09/2023, 1:20 AM

## 2023-07-09 NOTE — Plan of Care (Signed)
Problem: Activity: Goal: Interest or engagement in activities will improve Outcome: Progressing   Problem: Coping: Goal: Ability to verbalize frustrations and anger appropriately will improve Outcome: Progressing Goal: Ability to demonstrate self-control will improve Outcome: Progressing

## 2023-07-09 NOTE — Progress Notes (Signed)
D. Pt has been friendly upon approach- pt stated that his stay here has had a "calming effect" on him, and that he has "gotten a lot out of groups."  Pt has been visible in the milieu, observed interacting well with peers and attending group this afternoon. Pt currently denies withdrawal symptoms, SI/HI and AVH and does not appear to be responding to internal stimuli.  A. Labs and vitals monitored. Pt given and educated on medications. Pt supported emotionally and encouraged to express concerns and ask questions.   R. Pt remains safe with 15 minute checks. Will continue POC.

## 2023-07-09 NOTE — Group Note (Signed)
Recreation Therapy Group Note   Group Topic:Animal Assisted Therapy   Group Date: 07/09/2023 Start Time: 0950 End Time: 1035 Facilitators: Gabryela Kimbrell, Benito Mccreedy, LRT Location: [address] Dayroom  Animal-Assisted Activity (AAA) Program Checklist/Progress Note Patient Eligibility Criteria Checklist & Daily Group note for Rec Tx Intervention   AAA/T Program Assumption of Risk Form signed by Patient/ or Parent Legal Guardian YES  Patient is free of allergies or severe asthma  YES  Patient reports no fear of animals YES  Patient reports no history of cruelty to animals YES  Patient understands their participation is voluntary YES  Patient washes hands before animal contact YES  Patient washes hands after animal contact YES   Group Description: Patients provided opportunity to interact with trained and credentialed [employer] Therapy dog and the community volunteer/dog handler. Patients practiced appropriate animal interaction and were educated on dog safety outside of the hospital in common community settings. Patients were allowed to use dog toys and other items to practice commands, engage the dog in play, and/or complete routine aspects of animal care.   Education: Charity fundraiser, Health visitor, Communication & Social Skills   Affect/Mood: Congruent and Happy   Participation Level: Engaged   Participation Quality: Independent   Behavior: Attentive , Calm, Cooperative, and Interactive    Speech/Thought Process: Directed, Focused, and Relevant   Modes of Intervention: Activity, Teaching laboratory technician, and Socialization   Patient Response to Interventions:  Interested  and Receptive   Education Outcome:  Acknowledges education   Clinical Observations/Individualized Feedback: Oryn attended AAA programming and interacted appropriately with the therapy dog, Dixie and peers. Pt spontaneously shared stories about their experiences with animals. Pt expressed that they  have a [place] named [address] and a [place] named [place] as their dogs. Pt elaborated that they also have a bearded dragon, ball python, and fish as other pets at home.   Benito Mccreedy Ani Deoliveira, LRT, CTRS 07/09/2023 12:58 PM

## 2023-07-09 NOTE — Progress Notes (Signed)
3.6, BUN less than 5, bilirubin 1.3 high 07/06/23 Completed potassium chloride 40 mEq x 1 dose only.  BMP: K+ 3.3  Ativan detox protocol with CIWA: See MAR   Agitation protocol: Benadryl capsule 50 mg p.o. or IM 3 times daily as  needed agitation   Haldol tablets 5 mg po IM 3 times daily as needed agitation   Lorazepam tablet 2 mg p.o. or IM 3 times daily as needed agitation     Other PRN Medications -Acetaminophen 650 mg every 6 as needed/mild pain -Maalox 30 mL oral every 4 as needed/digestion -Magnesium hydroxide 30 mL daily as needed/mild constipation -- Nicotine gum 2 mg p.o. for smoking cessation   -- The risks/benefits/side-effects/alternatives to this medication were discussed in detail with the patient and time was given for questions. The patient consents to medication trial.  -- Metabolic profile and EKG monitoring obtained while on an atypical antipsychotic (BMI: Lipid Panel: HbgA1c: QTc:)  -- Encouraged patient to participate in unit milieu and in scheduled group therapies    Labs reviewed: CMP: Potassium 3.2, replace with 40 mEq of potassium chloride x 1 dose only; AST 66 high, total bilirubin 1.3 high.  CBC with differential: RBC 4.21 low, MCV 101 0.7 high, MCH 34.9 high.  BAL 238 high.  UDS positive for benzodiazepine.   07/06/23 Labs ordered: Lipid panel, hemoglobin A1c, TSH, BMP: K+ 3.3.   07/08/23 CMP: Completed potassium chloride 40 meq x 1 dose only ON 07/07/23, CMP: K+ 3.6, BUN less than 5, bilirubin 1.3 high  EKG: Last EKG 06/24/2022.   Safety and Monitoring: Voluntary admission to inpatient psychiatric unit for safety, stabilization and treatment Daily contact with patient to assess and evaluate symptoms and progress in treatment Patient's case to be discussed in multi-disciplinary team meeting Observation Level : q15 minute checks Vital signs: q12 hours Precautions: suicide, but pt currently verbally contracts for safety on unit    Discharge Planning: Social work and case management to assist with discharge planning and identification of hospital follow-up needs prior to discharge Estimated LOS: 5-7 days Discharge Concerns: Need to establish a safety plan; Medication compliance and  effectiveness Discharge Goals: Return home with outpatient referrals for mental health follow-up including medication management/psychotherapy.   Long Term Goal(s): Improvement in symptoms so as ready for discharge   Short Term Goals: Ability to identify changes in lifestyle to reduce recurrence of condition will improve, Ability to verbalize feelings will improve, Ability to disclose and discuss suicidal ideas, Ability to demonstrate self-control will improve, Ability to identify and develop effective coping behaviors will improve, Ability to maintain clinical measurements within normal limits will improve, Compliance with prescribed medications will improve, and Ability to identify triggers associated with substance abuse/mental health issues will improve   Physician Treatment Plan for Secondary Diagnosis: Principal Problem:   Alcohol use disorder Depression   I certify that inpatient services furnished can reasonably be expected to improve the patient's condition.    Cecilie Lowers, FNP 07/09/2023, 1:34 PM Patient ID: Nicholas Fletcher, male   DOB: 1976/02/17, 47 y.o.   MRN: [id number]  3.6, BUN less than 5, bilirubin 1.3 high 07/06/23 Completed potassium chloride 40 mEq x 1 dose only.  BMP: K+ 3.3  Ativan detox protocol with CIWA: See MAR   Agitation protocol: Benadryl capsule 50 mg p.o. or IM 3 times daily as  needed agitation   Haldol tablets 5 mg po IM 3 times daily as needed agitation   Lorazepam tablet 2 mg p.o. or IM 3 times daily as needed agitation     Other PRN Medications -Acetaminophen 650 mg every 6 as needed/mild pain -Maalox 30 mL oral every 4 as needed/digestion -Magnesium hydroxide 30 mL daily as needed/mild constipation -- Nicotine gum 2 mg p.o. for smoking cessation   -- The risks/benefits/side-effects/alternatives to this medication were discussed in detail with the patient and time was given for questions. The patient consents to medication trial.  -- Metabolic profile and EKG monitoring obtained while on an atypical antipsychotic (BMI: Lipid Panel: HbgA1c: QTc:)  -- Encouraged patient to participate in unit milieu and in scheduled group therapies    Labs reviewed: CMP: Potassium 3.2, replace with 40 mEq of potassium chloride x 1 dose only; AST 66 high, total bilirubin 1.3 high.  CBC with differential: RBC 4.21 low, MCV 101 0.7 high, MCH 34.9 high.  BAL 238 high.  UDS positive for benzodiazepine.   07/06/23 Labs ordered: Lipid panel, hemoglobin A1c, TSH, BMP: K+ 3.3.   07/08/23 CMP: Completed potassium chloride 40 meq x 1 dose only ON 07/07/23, CMP: K+ 3.6, BUN less than 5, bilirubin 1.3 high  EKG: Last EKG 06/24/2022.   Safety and Monitoring: Voluntary admission to inpatient psychiatric unit for safety, stabilization and treatment Daily contact with patient to assess and evaluate symptoms and progress in treatment Patient's case to be discussed in multi-disciplinary team meeting Observation Level : q15 minute checks Vital signs: q12 hours Precautions: suicide, but pt currently verbally contracts for safety on unit    Discharge Planning: Social work and case management to assist with discharge planning and identification of hospital follow-up needs prior to discharge Estimated LOS: 5-7 days Discharge Concerns: Need to establish a safety plan; Medication compliance and  effectiveness Discharge Goals: Return home with outpatient referrals for mental health follow-up including medication management/psychotherapy.   Long Term Goal(s): Improvement in symptoms so as ready for discharge   Short Term Goals: Ability to identify changes in lifestyle to reduce recurrence of condition will improve, Ability to verbalize feelings will improve, Ability to disclose and discuss suicidal ideas, Ability to demonstrate self-control will improve, Ability to identify and develop effective coping behaviors will improve, Ability to maintain clinical measurements within normal limits will improve, Compliance with prescribed medications will improve, and Ability to identify triggers associated with substance abuse/mental health issues will improve   Physician Treatment Plan for Secondary Diagnosis: Principal Problem:   Alcohol use disorder Depression   I certify that inpatient services furnished can reasonably be expected to improve the patient's condition.    Cecilie Lowers, FNP 07/09/2023, 1:34 PM Patient ID: Nicholas Fletcher, male   DOB: 1976/02/17, 47 y.o.   MRN: [id number]  Patient ID: Nicholas Fletcher, male   DOB: November 18, 1975, 47 y.o.   MRN: [id number] [facility] MD Progress Note  07/09/2023 1:34 PM Nicholas Fletcher  MRN:  [id number]  Principal Problem: Alcohol use disorder Diagnosis: Principal Problem:   Alcohol use disorder  Reason for admission: Nicholas Fletcher is a 47 year old AA male with prior psychiatric diagnoses significant for long-term alcoholism and GAD who is involuntarily admitted to [facility] from [name], ED for aggressive behavior towards his wife.  In the context of alcohol intoxication.  After medical evaluation/stabilization & clearance, he was transferred to the [facility] for further psychiatric evaluation & treatments.  Yesterday the psychiatry team made the following recommendations:  Continue Sertraline 50 mg p.o. daily for anxiety / depression Continue trazodone tablets 50 mg p.o. nightly as needed for insomnia 07/07/23 Completed potassium chloride 40 meq x 1 dose only. CMP: K+ 3.6, BUN less than 5, bilirubin 1.3 high 07/06/23 Completed potassium chloride 40 mEq x 1 dose only.  BMP: K+ 3.3   Today's assessment notes: Shishir reports that his mood is less depressed and improving.  He continues on Zoloft 50 mg p.o. daily for depression.  He is alert, calm, and oriented to person, place, time and situation.  Chart reviewed and findings shared with the treatment team and discussed with attending psychiatrist.  No alcohol withdrawal symptoms observed during the evaluation.  CIWA score of 1 on 07/08/23. As per nursing staff, patient declined am Ativan, denies ETOH withdrawal sx. Pt also denied having SI/HI, or AVH, plans or intent and verbally committed to reach out to staff if he become overwhelmed with these thoughts. Pt focused on finding outpatient services to attend after discharge from the hospital, including AA mtgs.Will continue to monitor for safety and support according to plan of care.  Vital signs reviewed without critical values.  Expected date of  discharge on 07/11/2023. Reports that anxiety is at manageable level Nursing staff report patient sleeping over 7.5 hours last night and being restful.   Appetite is good Concentration is improving Energy level is adequate Denies suicidal thoughts and denies suicidal intent or plan.  Denies having any HI.  Denies having psychotic symptoms.   Denies having side effects to current psychiatric medications.   We discussed compliance to current medication regimen, and adjusting Zoloft from 25 mg to 50 mg p.o. daily for depression.  Patient in agreement with medication adjustment.  Discussed the following psychosocial stressors:  Cessation increased alcohol consumption and use of benzos as these adversely affect overall psychiatric and medical wellbeing.  Patient acknowledge instructions and plans to quit use of benzodiazepines and alcohol use.  Plans to attend outpatient counseling and therapy for alcohol use.  07/07/23 Collateral information: Patient's wife Raeden Paskins at [phone] called per patient permission for more information on patient.  Ms. Suzette Battiest reports same information as indicated in the IVC papers.  She added that patient does not need to be discharged prior to his symptoms resolving and patient should be  willing to attend outpatient counseling and therapy for his chronic alcohol dependency  Total Time spent with patient: 35 Minutes  Past Psychiatric History: Previous Psych Diagnoses: Denies Prior inpatient treatment: Denies Current/prior outpatient treatment: Sertraline for anxiety.  Hydroxyzine causes hallucination Prior rehab hx: Denies Psychotherapy hx: Yes History of suicide: Denies History of homicide or aggression: Denies Psychiatric medication history: Denies Psychiatric medication compliance history: Patient has not been prescribed psychotropic meds Neuromodulation history: Denies Current Psychiatrist: Denies Current therapist: Denies  3.6, BUN less than 5, bilirubin 1.3 high 07/06/23 Completed potassium chloride 40 mEq x 1 dose only.  BMP: K+ 3.3  Ativan detox protocol with CIWA: See MAR   Agitation protocol: Benadryl capsule 50 mg p.o. or IM 3 times daily as  needed agitation   Haldol tablets 5 mg po IM 3 times daily as needed agitation   Lorazepam tablet 2 mg p.o. or IM 3 times daily as needed agitation     Other PRN Medications -Acetaminophen 650 mg every 6 as needed/mild pain -Maalox 30 mL oral every 4 as needed/digestion -Magnesium hydroxide 30 mL daily as needed/mild constipation -- Nicotine gum 2 mg p.o. for smoking cessation   -- The risks/benefits/side-effects/alternatives to this medication were discussed in detail with the patient and time was given for questions. The patient consents to medication trial.  -- Metabolic profile and EKG monitoring obtained while on an atypical antipsychotic (BMI: Lipid Panel: HbgA1c: QTc:)  -- Encouraged patient to participate in unit milieu and in scheduled group therapies    Labs reviewed: CMP: Potassium 3.2, replace with 40 mEq of potassium chloride x 1 dose only; AST 66 high, total bilirubin 1.3 high.  CBC with differential: RBC 4.21 low, MCV 101 0.7 high, MCH 34.9 high.  BAL 238 high.  UDS positive for benzodiazepine.   07/06/23 Labs ordered: Lipid panel, hemoglobin A1c, TSH, BMP: K+ 3.3.   07/08/23 CMP: Completed potassium chloride 40 meq x 1 dose only ON 07/07/23, CMP: K+ 3.6, BUN less than 5, bilirubin 1.3 high  EKG: Last EKG 06/24/2022.   Safety and Monitoring: Voluntary admission to inpatient psychiatric unit for safety, stabilization and treatment Daily contact with patient to assess and evaluate symptoms and progress in treatment Patient's case to be discussed in multi-disciplinary team meeting Observation Level : q15 minute checks Vital signs: q12 hours Precautions: suicide, but pt currently verbally contracts for safety on unit    Discharge Planning: Social work and case management to assist with discharge planning and identification of hospital follow-up needs prior to discharge Estimated LOS: 5-7 days Discharge Concerns: Need to establish a safety plan; Medication compliance and  effectiveness Discharge Goals: Return home with outpatient referrals for mental health follow-up including medication management/psychotherapy.   Long Term Goal(s): Improvement in symptoms so as ready for discharge   Short Term Goals: Ability to identify changes in lifestyle to reduce recurrence of condition will improve, Ability to verbalize feelings will improve, Ability to disclose and discuss suicidal ideas, Ability to demonstrate self-control will improve, Ability to identify and develop effective coping behaviors will improve, Ability to maintain clinical measurements within normal limits will improve, Compliance with prescribed medications will improve, and Ability to identify triggers associated with substance abuse/mental health issues will improve   Physician Treatment Plan for Secondary Diagnosis: Principal Problem:   Alcohol use disorder Depression   I certify that inpatient services furnished can reasonably be expected to improve the patient's condition.    Cecilie Lowers, FNP 07/09/2023, 1:34 PM Patient ID: Nicholas Fletcher, male   DOB: 1976/02/17, 47 y.o.   MRN: [id number]  Patient ID: Nicholas Fletcher, male   DOB: November 18, 1975, 47 y.o.   MRN: [id number] [facility] MD Progress Note  07/09/2023 1:34 PM Nicholas Fletcher  MRN:  [id number]  Principal Problem: Alcohol use disorder Diagnosis: Principal Problem:   Alcohol use disorder  Reason for admission: Nicholas Fletcher is a 47 year old AA male with prior psychiatric diagnoses significant for long-term alcoholism and GAD who is involuntarily admitted to [facility] from [name], ED for aggressive behavior towards his wife.  In the context of alcohol intoxication.  After medical evaluation/stabilization & clearance, he was transferred to the [facility] for further psychiatric evaluation & treatments.  Yesterday the psychiatry team made the following recommendations:  Continue Sertraline 50 mg p.o. daily for anxiety / depression Continue trazodone tablets 50 mg p.o. nightly as needed for insomnia 07/07/23 Completed potassium chloride 40 meq x 1 dose only. CMP: K+ 3.6, BUN less than 5, bilirubin 1.3 high 07/06/23 Completed potassium chloride 40 mEq x 1 dose only.  BMP: K+ 3.3   Today's assessment notes: Shishir reports that his mood is less depressed and improving.  He continues on Zoloft 50 mg p.o. daily for depression.  He is alert, calm, and oriented to person, place, time and situation.  Chart reviewed and findings shared with the treatment team and discussed with attending psychiatrist.  No alcohol withdrawal symptoms observed during the evaluation.  CIWA score of 1 on 07/08/23. As per nursing staff, patient declined am Ativan, denies ETOH withdrawal sx. Pt also denied having SI/HI, or AVH, plans or intent and verbally committed to reach out to staff if he become overwhelmed with these thoughts. Pt focused on finding outpatient services to attend after discharge from the hospital, including AA mtgs.Will continue to monitor for safety and support according to plan of care.  Vital signs reviewed without critical values.  Expected date of  discharge on 07/11/2023. Reports that anxiety is at manageable level Nursing staff report patient sleeping over 7.5 hours last night and being restful.   Appetite is good Concentration is improving Energy level is adequate Denies suicidal thoughts and denies suicidal intent or plan.  Denies having any HI.  Denies having psychotic symptoms.   Denies having side effects to current psychiatric medications.   We discussed compliance to current medication regimen, and adjusting Zoloft from 25 mg to 50 mg p.o. daily for depression.  Patient in agreement with medication adjustment.  Discussed the following psychosocial stressors:  Cessation increased alcohol consumption and use of benzos as these adversely affect overall psychiatric and medical wellbeing.  Patient acknowledge instructions and plans to quit use of benzodiazepines and alcohol use.  Plans to attend outpatient counseling and therapy for alcohol use.  07/07/23 Collateral information: Patient's wife Raeden Paskins at [phone] called per patient permission for more information on patient.  Ms. Suzette Battiest reports same information as indicated in the IVC papers.  She added that patient does not need to be discharged prior to his symptoms resolving and patient should be  willing to attend outpatient counseling and therapy for his chronic alcohol dependency  Total Time spent with patient: 35 Minutes  Past Psychiatric History: Previous Psych Diagnoses: Denies Prior inpatient treatment: Denies Current/prior outpatient treatment: Sertraline for anxiety.  Hydroxyzine causes hallucination Prior rehab hx: Denies Psychotherapy hx: Yes History of suicide: Denies History of homicide or aggression: Denies Psychiatric medication history: Denies Psychiatric medication compliance history: Patient has not been prescribed psychotropic meds Neuromodulation history: Denies Current Psychiatrist: Denies Current therapist: Denies

## 2023-07-09 NOTE — Plan of Care (Signed)

## 2023-07-09 NOTE — [facility] Group Notes (Signed)
[facility] Group Notes:  (Nursing/MHT/Case Management/Adjunct)  Date:  07/09/2023  Time:  8:46 PM  Type of Therapy:   Wrap-Up Group  Participation Level:  Active  Participation Quality:  Appropriate  Affect:  Appropriate  Cognitive:  Appropriate and Oriented  Insight:  Appropriate and Improving  Engagement in Group:  Developing/Improving and Improving  Modes of Intervention:  Discussion  Summary of Progress/Problems: Patient participated appropriately in group. Patient states he is enthusiastic about leaving Thursday and is looking forward to participating in groups tomorrow.  Nicholas Fletcher 07/09/2023, 8:46 PM

## 2023-07-10 DIAGNOSIS — F1090 Alcohol use, unspecified, uncomplicated: Secondary | ICD-10-CM | POA: Diagnosis not present

## 2023-07-10 MED ORDER — LORAZEPAM 1 MG PO TABS
1.0000 mg | ORAL_TABLET | Freq: Once | ORAL | Status: AC
  Administered 2023-07-10: 1 mg via ORAL
  Filled 2023-07-10: qty 1

## 2023-07-10 NOTE — [facility] Group Notes (Signed)
[facility] Group Notes:  (Nursing/MHT/Case Management/Adjunct)  Date:  07/10/2023  Time:  8:31 PM  Type of Therapy:   Wrap Up Group  Participation Level:  Active  Participation Quality:  Appropriate  Affect:  Appropriate  Cognitive:  Alert and Appropriate  Insight:  Appropriate, Good, and Improving  Engagement in Group:  Engaged and Improving  Modes of Intervention:  Discussion and Support  Summary of Progress/Problems: Patient participated in NA group. Patient engaged appropriately. No issues to report.  Kerin Cecchi 07/10/2023, 8:31 PM

## 2023-07-10 NOTE — Progress Notes (Signed)
   07/09/23 2233  Psych Admission Type (Psych Patients Only)  Admission Status Involuntary  Psychosocial Assessment  Patient Complaints None  Eye Contact Fair  Facial Expression Animated  Affect Appropriate to circumstance  Speech Logical/coherent  Interaction Assertive  Motor Activity Slow  Appearance/Hygiene Unremarkable  Behavior Characteristics Cooperative;Appropriate to situation  Mood Pleasant  Thought Process  Coherency WDL  Content WDL  Delusions None reported or observed  Perception WDL  Hallucination None reported or observed  Judgment WDL  Confusion None  Danger to Self  Current suicidal ideation? Denies  Danger to Others  Danger to Others None reported or observed

## 2023-07-10 NOTE — Plan of Care (Signed)
Problem: Education: Goal: Knowledge of [facility] Education information/materials will improve Outcome: Progressing   Problem: Activity: Goal: Interest or engagement in activities will improve Outcome: Progressing   Problem: Coping: Goal: Ability to verbalize frustrations and anger appropriately will improve Outcome: Progressing

## 2023-07-10 NOTE — Progress Notes (Signed)
   07/10/23 2314  Psych Admission Type (Psych Patients Only)  Admission Status Involuntary  Psychosocial Assessment  Patient Complaints None  Eye Contact Fair  Facial Expression Animated  Affect Appropriate to circumstance  Speech Logical/coherent  Interaction Assertive  Motor Activity Other (Comment) (WNL)  Appearance/Hygiene Unremarkable  Behavior Characteristics Cooperative;Appropriate to situation  Mood Pleasant  Thought Process  Coherency WDL  Content WDL  Delusions None reported or observed  Perception WDL  Hallucination None reported or observed  Judgment WDL  Confusion None  Danger to Self  Current suicidal ideation? Denies  Danger to Others  Danger to Others None reported or observed

## 2023-07-10 NOTE — Group Note (Signed)
Recreation Therapy Group Note   Group Topic:Leisure Education  Group Date: 07/10/2023 Start Time: 0935 End Time: 1025 Facilitators: Nyliah Nierenberg-McCall, LRT,CTRS Location: [address] Dayroom   Group Topic: Leisure Education   Goal Area(s) Addresses:  Patient will successfully identify positive leisure and recreation activities.  Patient will acknowledge benefits of participation in healthy leisure activities post discharge.  Patient will actively work with peers toward a shared goal.   Intervention: Group Game    Group Description: Music Trivia. LRT asked patients music questions about oldies but goodies and 90s, 2000s hip hop and r&b. In one group, patients worked together to figure out the next lyric to the songs presented in group. The activity was also used to show patients that leisure didn't have to be an expensive activity and can be enjoyed as a group as well as individually.    Education:  Teacher, English as a foreign language, Leisure as Merchant navy officer, Programmer, applications, Building control surveyor   Education Outcome: Acknowledges education/In group clarification offered/Needs additional education   Affect/Mood: Appropriate   Participation Level: Engaged   Participation Quality: Independent   Behavior: Appropriate   Speech/Thought Process: Focused   Insight: Good   Judgement: Good   Modes of Intervention: Cooperative Play   Patient Response to Interventions:  Engaged   Education Outcome:  In group clarification offered    Clinical Observations/Individualized Feedback: Pt was late coming into group session but joined right in with the activity. Pt was focused and engaged with peers on figuring out the lyrics.    Plan: Continue to engage patient in RT group sessions 2-3x/week.   Briselda Naval-McCall, LRT,CTRS  07/10/2023 11:57 AM

## 2023-07-10 NOTE — Progress Notes (Signed)
Patient ID: Nicholas Fletcher, male   DOB: 05-15-1976, 47 y.o.   MRN: [id number] [facility] MD Progress Note  07/10/2023 10:58 AM Nicholas Fletcher  MRN:  [id number]  Principal Problem: Alcohol use disorder Diagnosis: Principal Problem:   Alcohol use disorder  Reason for admission: Nicholas Fletcher is a 47 year old AA male with prior psychiatric diagnoses significant for long-term alcoholism and GAD who is involuntarily admitted to [facility] from [facility] 47 y.o. [name], ED for aggressive behavior towards his wife.  In the context of alcohol intoxication.  After medical evaluation/stabilization & clearance, he was transferred to the [facility] for further psychiatric evaluation & treatments.  Yesterday the psychiatry team made the following recommendations:  Continue Sertraline 50 mg p.o. daily for anxiety / depression  Continue trazodone tablets 50 mg p.o. nightly as needed for insomnia 07/07/23 Completed potassium chloride 40 meq x 1 dose only. CMP: K+ 3.6, BUN less than 5, bilirubin 1.3 high 07/06/23 Completed potassium chloride 40 mEq x 1 dose only.  BMP: K+ 3.3   Today's assessment notes: On assessment today, the pt reports that his mood is euthymic, improved since admission, and stable. Denies feeling down, depressed, or sad.  Reports that anxiety symptoms are at manageable level.  Sleep is stable. Appetite is stable.  Concentration is without complaint.  Energy level is adequate. Denies having any suicidal thoughts. Denies having any suicidal intent and plan.  Denies having any HI.  Denies having psychotic symptoms.   Denies having side effects to current psychiatric medications.   Discussed discharge planning: How to identify the signs of impending crisis, use of internal coping strategies, reaching out to friends and family that can help navigate a crisis, and a list of mental health professionals and agencies to call. Further to follow up on her mental health appointments and his PCP appointments.  No changes to  his treatment plan today, expected date of discharge tomorrow 07/11/2023.  07/07/23 Collateral information: Patient's wife Nicholas Fletcher at [phone] called per patient permission for more information on patient.  Ms. Suzette Battiest reports same information as indicated in the IVC papers.  She added that patient does not need to be discharged prior to his symptoms resolving and patient should be  willing to attend outpatient counseling and therapy for his chronic alcohol dependency.  07/10/23: Patient's wife Nicholas Fletcher called at [phone] to inform patient will be discharging tomorrow.  However, unable to reach, HIPPA protective send information left on her voicemail to call this provider back.  Total Time spent with patient: 35 Minutes  Past Psychiatric History: Previous Psych Diagnoses: Denies Prior inpatient treatment: Denies Current/prior outpatient treatment: Sertraline for anxiety.  Hydroxyzine causes hallucination Prior rehab hx: Denies Psychotherapy hx: Yes History of suicide: Denies History of homicide or aggression: Denies Psychiatric medication history: Denies Psychiatric medication compliance history: Patient has not been prescribed psychotropic meds Neuromodulation history: Denies Current Psychiatrist: Denies Current therapist: Denies   Past Medical History:  Past Medical History:  Diagnosis Date   Medical history non-contributory    Seizures (HCC)    pt reports having a seizure 05/2023    Past Surgical History:  Procedure Laterality Date   COLONOSCOPY WITH PROPOFOL N/A 09/26/2021   Procedure: COLONOSCOPY WITH PROPOFOL;  Surgeon: Regis Bill, MD;  Location: [facility] ENDOSCOPY;  Service: Endoscopy;  Laterality: N/A;   COLONOSCOPY WITH PROPOFOL N/A 09/28/2021   Procedure: COLONOSCOPY WITH PROPOFOL;  Surgeon: Regis Bill, MD;  Location: [facility] ENDOSCOPY;  Service: Endoscopy;  Laterality: N/A;  ESOPHAGOGASTRODUODENOSCOPY (EGD) WITH PROPOFOL N/A 09/26/2021    Procedure: ESOPHAGOGASTRODUODENOSCOPY (EGD) WITH PROPOFOL;  Surgeon: Regis Bill, MD;  Location: [facility] ENDOSCOPY;  Service: Endoscopy;  Laterality: N/A;   TONSILLECTOMY     Family History:  Family History  Family history unknown: Yes   Family Psychiatric  History: See H&P  Social History:  Social History   Substance and Sexual Activity  Alcohol Use Yes   Alcohol/week: 4.0 standard drinks of alcohol   Types: 1 Cans of beer, 3 Shots of liquor per week     Social History   Substance and Sexual Activity  Drug Use Not Currently    Social History   Socioeconomic History   Marital status: Married    Spouse name: Not on file   Number of children: Not on file   Years of education: 16   Highest education level: Associate degree: occupational, Scientist, product/process development, or vocational program  Occupational History   Not on file  Tobacco Use   Smoking status: Light Smoker    Types: Cigars   Smokeless tobacco: Never  Vaping Use   Vaping status: Every Day  Substance and Sexual Activity   Alcohol use: Yes    Alcohol/week: 4.0 standard drinks of alcohol    Types: 1 Cans of beer, 3 Shots of liquor per week   Drug use: Not Currently   Sexual activity: Not on file  Other Topics Concern   Not on file  Social History Narrative   Not on file   Social Determinants of Health   Financial Resource Strain: Not on file  Food Insecurity: No Food Insecurity (07/06/2023)   Hunger Vital Sign    Worried About Running Out of Food in the Last Year: Never true    Ran Out of Food in the Last Year: Never true  Transportation Needs: No Transportation Needs (07/06/2023)   PRAPARE - Administrator, Civil Service (Medical): No    Lack of Transportation (Non-Medical): No  Physical Activity: Not on file  Stress: Not on file  Social Connections: Unknown (03/06/2022)   Received from [facility]   Social Network    Social Network: Not on file   Additional Social History:     Sleep: Good  Appetite:  Good  Current Medications: Current Facility-Administered Medications  Medication Dose Route Frequency Provider Last Rate Last Admin   acetaminophen (TYLENOL) tablet 650 mg  650 mg Oral Q6H PRN Bobbitt, Shalon E, NP   650 mg at 07/07/23 2109   alum & mag hydroxide-simeth (MAALOX/MYLANTA) 200-200-20 MG/5ML suspension 30 mL  30 mL Oral Q4H PRN Bobbitt, Shalon E, NP       diphenhydrAMINE (BENADRYL) capsule 50 mg  50 mg Oral TID PRN Bobbitt, Shalon E, NP       Or   diphenhydrAMINE (BENADRYL) injection 50 mg  50 mg Intramuscular TID PRN Bobbitt, Shalon E, NP       haloperidol (HALDOL) tablet 5 mg  5 mg Oral TID PRN Bobbitt, Shalon E, NP       Or   haloperidol lactate (HALDOL) injection 5 mg  5 mg Intramuscular TID PRN Bobbitt, Shalon E, NP       LORazepam (ATIVAN) tablet 2 mg  2 mg Oral TID PRN Bobbitt, Shalon E, NP       Or   LORazepam (ATIVAN) injection 2 mg  2 mg Intramuscular TID PRN Bobbitt, Shalon E, NP       magnesium hydroxide (MILK OF MAGNESIA) suspension  30 mL  30 mL Oral Daily PRN Bobbitt, Shalon E, NP       multivitamin with minerals tablet 1 tablet  1 tablet Oral Daily Bobbitt, Shalon E, NP   1 tablet at 07/10/23 0752   naphazoline-pheniramine (NAPHCON-A) 0.025-0.3 % ophthalmic solution 1 drop  1 drop Both Eyes QID PRN Duston Smolenski, Jesusita Oka, FNP   1 drop at 07/10/23 0750   nicotine (NICODERM CQ - dosed in mg/24 hours) patch 14 mg  14 mg Transdermal Daily Lavante Toso C, FNP   14 mg at 07/10/23 0751   sertraline (ZOLOFT) tablet 50 mg  50 mg Oral Daily Kenora Spayd C, FNP   50 mg at 07/10/23 1610   thiamine (Vitamin B-1) tablet 100 mg  100 mg Oral Daily Bobbitt, Shalon E, NP   100 mg at 07/10/23 9604   thiamine (VITAMIN B1) injection 100 mg  100 mg Intramuscular Once Bobbitt, Shalon E, NP       traZODone (DESYREL) tablet 50 mg  50 mg Oral QHS PRN Bobbitt, Shalon E, NP   50 mg at 07/09/23 2109    Lab Results:  No results found for this or any previous visit (from the  past 48 hour(s)).  Blood Alcohol level:  Lab Results  Component Value Date   ETH 238 (H) 07/04/2023   ETH <10 06/05/2023   Metabolic Disorder Labs: No results found for: "HGBA1C", "MPG" No results found for: "PROLACTIN" No results found for: "CHOL", "TRIG", "HDL", "CHOLHDL", "VLDL", "LDLCALC"  Physical Findings: AIMS:  , ,  ,  ,    CIWA:  CIWA-Ar Total: 1 COWS:     Musculoskeletal: Strength & Muscle Tone: within normal limits Gait & Station: normal Patient leans: N/A  Psychiatric Specialty Exam:  Presentation  General Appearance:  Appropriate for Environment; Casual; Fairly Groomed  Eye Contact: Good  Speech: Clear and Coherent; Normal Rate  Speech Volume: Normal  Handedness: Right  Mood and Affect  Mood: Euthymic  Affect: Congruent  Thought Process  Thought Processes: Coherent  Descriptions of Associations:Intact  Orientation:Full (Time, Place and Person)  Thought Content:Logical  History of Schizophrenia/Schizoaffective disorder:No data recorded Duration of Psychotic Symptoms:No data recorded Hallucinations:Hallucinations: None  Ideas of Reference:None  Suicidal Thoughts:Suicidal Thoughts: No  Homicidal Thoughts:Homicidal Thoughts: No  Sensorium  Memory: Immediate Good; Recent Good  Judgment: Fair  Insight: Fair  Art therapist  Concentration: Good  Attention Span: Good  Recall: Fair  Fund of Knowledge: Fair  Language: Good  Psychomotor Activity  Psychomotor Activity: Psychomotor Activity: Normal  Assets  Assets: Communication Skills; Desire for Improvement; Housing; Physical Health; Resilience; Social Support  Sleep  Sleep: Sleep: Good Number of Hours of Sleep: 7.5  Physical Exam: Physical Exam Vitals and nursing note reviewed.  HENT:     Head: Normocephalic.     Nose: Nose normal.     Mouth/Throat:     Mouth: Mucous membranes are moist.  Eyes:     Extraocular Movements: Extraocular movements  intact.  Cardiovascular:     Rate and Rhythm: Normal rate.     Pulses: Normal pulses.  Pulmonary:     Effort: Pulmonary effort is normal.  Abdominal:     Comments: Deferred  Genitourinary:    Comments: Deferred Musculoskeletal:        General: Normal range of motion.     Cervical back: Normal range of motion.  Skin:    General: Skin is warm.  Neurological:     General: No focal deficit present.  Mental Status: He is alert and oriented to person, place, and time.  Psychiatric:        Mood and Affect: Mood normal.        Behavior: Behavior normal.        Thought Content: Thought content normal.    Review of Systems  Constitutional:  Negative for chills and fever.  HENT:  Negative for sore throat.   Eyes:  Negative for blurred vision.       Redness to both eyes due to allergies.  Visine eyedrops ordered 4 times daily as needed.  Respiratory:  Negative for cough, shortness of breath and wheezing.   Cardiovascular:  Negative for chest pain and palpitations.  Gastrointestinal:  Negative for abdominal pain, heartburn, nausea and vomiting.  Genitourinary:  Negative for dysuria, frequency and urgency.  Musculoskeletal: Negative.   Skin:  Negative for itching and rash.  Neurological:  Negative for dizziness, tingling, tremors and headaches.  Endo/Heme/Allergies:        See allergy listing  Psychiatric/Behavioral:  Positive for depression. The patient is nervous/anxious.    Blood pressure (!) 152/94, pulse 78, temperature (!) 97.4 F (36.3 C), temperature source Oral, resp. rate 20, height 5\' 11"  (1.803 m), weight 79.8 kg, SpO2 100%. Body mass index is 24.55 kg/m.  Treatment Plan Summary: Daily contact with patient to assess and evaluate symptoms and progress in treatment and Medication management Physician Treatment Plan for Primary Diagnosis:  Assessment: Alcohol use disorder   Plans: Medications: Continue Sertraline 50 mg p.o. daily for anxiety / depression Continue  trazodone tablets 50 mg p.o. nightly as needed for insomnia 07/07/23 Completed potassium chloride 40 meq x 1 dose only. CMP: K+ 3.6, BUN less than 5, bilirubin 1.3 high 07/06/23 Completed potassium chloride 40 mEq x 1 dose only.  BMP: K+ 3.3  Ativan detox protocol with CIWA: See MAR   Agitation protocol: Benadryl capsule 50 mg p.o. or IM 3 times daily as needed agitation   Haldol tablets 5 mg po IM 3 times daily as needed agitation   Lorazepam tablet 2 mg p.o. or IM 3 times daily as needed agitation     Other PRN Medications -Acetaminophen 650 mg every 6 as needed/mild pain -Maalox 30 mL oral every 4 as needed/digestion -Magnesium hydroxide 30 mL daily as needed/mild constipation -- Nicotine gum 2 mg p.o. for smoking cessation   -- The risks/benefits/side-effects/alternatives to this medication were discussed in detail with the patient and time was given for questions. The patient consents to medication trial.  -- Metabolic profile and EKG monitoring obtained while on an atypical antipsychotic (BMI: Lipid Panel: HbgA1c: QTc:)  -- Encouraged patient to participate in unit milieu and in scheduled group therapies    Labs reviewed: CMP: Potassium 3.2, replace with 40 mEq of potassium chloride x 1 dose only; AST 66 high, total bilirubin 1.3 high.  CBC with differential: RBC 4.21 low, MCV 101 0.7 high, MCH 34.9 high.  BAL 238 high.  UDS positive for benzodiazepine.   07/06/23 Labs ordered: Lipid panel, hemoglobin A1c, TSH, BMP: K+ 3.3.   07/08/23 CMP: Completed potassium chloride 40 meq x 1 dose only ON 07/07/23, CMP: K+ 3.6, BUN less than 5, bilirubin 1.3 high  EKG: Last EKG 06/24/2022.   Safety and Monitoring: Voluntary admission to inpatient psychiatric unit for safety, stabilization and treatment Daily contact with patient to assess and evaluate symptoms and progress in treatment Patient's case to be discussed in multi-disciplinary team meeting Observation Level : q15 minute  checks Vital  signs: q12 hours Precautions: suicide, but pt currently verbally contracts for safety on unit    Discharge Planning: Social work and case management to assist with discharge planning and identification of hospital follow-up needs prior to discharge Estimated LOS: 5-7 days Discharge Concerns: Need to establish a safety plan; Medication compliance and effectiveness Discharge Goals: Return home with outpatient referrals for mental health follow-up including medication management/psychotherapy.   Long Term Goal(s): Improvement in symptoms so as ready for discharge   Short Term Goals: Ability to identify changes in lifestyle to reduce recurrence of condition will improve, Ability to verbalize feelings will improve, Ability to disclose and discuss suicidal ideas, Ability to demonstrate self-control will improve, Ability to identify and develop effective coping behaviors will improve, Ability to maintain clinical measurements within normal limits will improve, Compliance with prescribed medications will improve, and Ability to identify triggers associated with substance abuse/mental health issues will improve   Physician Treatment Plan for Secondary Diagnosis: Principal Problem: Alcohol use disorder Depression   I certify that inpatient services furnished can reasonably be expected to improve the patient's condition.    Cecilie Lowers, FNP 07/10/2023, 10:58 AM Patient ID: Manual Meier, male   DOB: [birth year], 47 y.o.   MRN: [id number] Patient ID: MATTEO LOOKER, male   DOB: 1976-07-31, 47 y.o.   MRN: [id number]

## 2023-07-10 NOTE — Plan of Care (Signed)
  Problem: Education: Goal: Emotional status will improve Outcome: Progressing Goal: Mental status will improve Outcome: Progressing Goal: Verbalization of understanding the information provided will improve Outcome: Progressing   Problem: Activity: Goal: Interest or engagement in activities will improve Outcome: Progressing Goal: Sleeping patterns will improve Outcome: Progressing   Problem: Coping: Goal: Ability to verbalize frustrations and anger appropriately will improve Outcome: Progressing Goal: Ability to demonstrate self-control will improve Outcome: Progressing   Problem: Health Behavior/Discharge Planning: Goal: Identification of resources available to assist in meeting health care needs will improve Outcome: Progressing   Problem: Physical Regulation: Goal: Ability to maintain clinical measurements within normal limits will improve Outcome: Progressing

## 2023-07-10 NOTE — Progress Notes (Signed)
   07/10/23 0900  Psych Admission Type (Psych Patients Only)  Admission Status Involuntary  Psychosocial Assessment  Patient Complaints None  Eye Contact Fair  Facial Expression Animated  Affect Appropriate to circumstance  Speech Logical/coherent  Interaction Assertive  Motor Activity Other (Comment) (wnl)  Appearance/Hygiene Unremarkable  Behavior Characteristics Cooperative;Appropriate to situation  Mood Pleasant  Thought Process  Coherency WDL  Content WDL  Delusions None reported or observed  Perception WDL  Hallucination None reported or observed  Judgment WDL  Confusion None  Danger to Self  Current suicidal ideation? Denies  Danger to Others  Danger to Others None reported or observed

## 2023-07-11 ENCOUNTER — Other Ambulatory Visit: Payer: Self-pay

## 2023-07-11 DIAGNOSIS — F1090 Alcohol use, unspecified, uncomplicated: Secondary | ICD-10-CM

## 2023-07-11 MED ORDER — TRAZODONE HCL 50 MG PO TABS
50.0000 mg | ORAL_TABLET | Freq: Every evening | ORAL | 0 refills | Status: DC | PRN
  Filled 2023-07-11: qty 30, 30d supply, fill #0

## 2023-07-11 MED ORDER — NICOTINE 14 MG/24HR TD PT24
14.0000 mg | MEDICATED_PATCH | Freq: Every day | TRANSDERMAL | 0 refills | Status: DC
  Filled 2023-07-11: qty 28, 28d supply, fill #0

## 2023-07-11 MED ORDER — SERTRALINE HCL 50 MG PO TABS
50.0000 mg | ORAL_TABLET | Freq: Every day | ORAL | 0 refills | Status: DC
  Filled 2023-07-11: qty 30, 30d supply, fill #0

## 2023-07-11 MED ORDER — ADULT MULTIVITAMIN W/MINERALS CH
1.0000 | ORAL_TABLET | Freq: Every day | ORAL | 0 refills | Status: DC
  Filled 2023-07-11: qty 30, 30d supply, fill #0

## 2023-07-11 MED ORDER — METOPROLOL SUCCINATE ER 25 MG PO TB24
25.0000 mg | ORAL_TABLET | Freq: Every day | ORAL | Status: DC
  Administered 2023-07-11: 25 mg via ORAL
  Filled 2023-07-11 (×4): qty 1

## 2023-07-11 MED ORDER — VITAMIN B-1 100 MG PO TABS
100.0000 mg | ORAL_TABLET | Freq: Every day | ORAL | 0 refills | Status: DC
  Filled 2023-07-11: qty 30, 30d supply, fill #0

## 2023-07-11 MED ORDER — CLONIDINE HCL 0.1 MG PO TABS
0.1000 mg | ORAL_TABLET | Freq: Once | ORAL | Status: AC
  Administered 2023-07-11: 0.1 mg via ORAL
  Filled 2023-07-11 (×2): qty 1

## 2023-07-11 MED ORDER — NAPHAZOLINE-PHENIRAMINE 0.025-0.3 % OP SOLN
1.0000 [drp] | Freq: Four times a day (QID) | OPHTHALMIC | 0 refills | Status: DC | PRN
  Filled 2023-07-11: qty 15, 75d supply, fill #0

## 2023-07-11 MED ORDER — METOPROLOL SUCCINATE ER 25 MG PO TB24
25.0000 mg | ORAL_TABLET | Freq: Every day | ORAL | 0 refills | Status: DC
  Filled 2023-07-11: qty 30, 30d supply, fill #0

## 2023-07-11 NOTE — Plan of Care (Signed)
  Problem: Education: Goal: Knowledge of [facility] Education information/materials will improve Outcome: Progressing Goal: Emotional status will improve Outcome: Progressing Goal: Mental status will improve Outcome: Progressing Goal: Verbalization of understanding the information provided will improve Outcome: Progressing   

## 2023-07-11 NOTE — Group Note (Signed)
Date:  07/11/2023 Time:  10:54 AM  Group Topic/Focus:  Goals Group:   The focus of this group is to help patients establish daily goals to achieve during treatment and discuss how the patient can incorporate goal setting into their daily lives to aide in recovery.    Participation Level:  Active  Participation Quality:  Appropriate  Affect:  Appropriate  Cognitive:  Appropriate  Insight: Appropriate  Engagement in Group:  Engaged  Modes of Intervention:  Discussion  Additional Comments:     Reymundo Poll 07/11/2023, 10:54 AM

## 2023-07-11 NOTE — Progress Notes (Signed)
Discharge Note:  Patient denies SI/HI/AVH at this time. Discharge instructions, AVS, prescriptions, and transition record gone over with patient. Patient agrees to comply with medication management, follow-up visit, and outpatient therapy. Patient belongings returned to patient. Patient questions and concerns addressed and answered. Patient ambulatory off unit. Patient discharged to home with spouse.

## 2023-07-11 NOTE — Discharge Summary (Signed)
Physician Discharge Summary Note  Patient:  Nicholas Fletcher is an 47 y.o., male MRN:  [id number] DOB:  [birth year] Patient phone:  [phone] (home)  Patient address:   [address] [place] [facility] [address],  Total Time spent with patient: 30 minutes  Date of Admission:  07/05/2023 Date of Discharge:   07/11/2023  Reason for Admission:  Nicholas Fletcher is a 47 year old AA male with prior psychiatric diagnoses significant for long-term alcoholism and GAD who is involuntarily admitted to [facility] from [name], ED for aggressive behavior towards his wife.  In the context of alcohol intoxication.  After medical evaluation/stabilization & clearance, he was transferred to the [facility] for further psychiatric evaluation & treatments.   Principal Problem: Alcohol use disorder Discharge Diagnoses: Principal Problem:   Alcohol use disorder  Past Psychiatric History: Previous Psych Diagnoses: Denies Prior inpatient treatment: Denies Current/prior outpatient treatment: Sertraline for anxiety.  Hydroxyzine causes hallucination Prior rehab hx: Denies Psychotherapy hx: Yes History of suicide: Denies History of homicide or aggression: Denies Psychiatric medication history: Denies Psychiatric medication compliance history: Patient has not been prescribed psychotropic meds Neuromodulation history: Denies Current Psychiatrist: Denies Current therapist: Denies  Past Medical History:  Past Medical History:  Diagnosis Date   Medical history non-contributory    Seizures (HCC)    pt reports having a seizure 05/2023    Past Surgical History:  Procedure Laterality Date   COLONOSCOPY WITH PROPOFOL N/A 09/26/2021   Procedure: COLONOSCOPY WITH PROPOFOL;  Surgeon: Regis Bill, MD;  Location: [facility] ENDOSCOPY;  Service: Endoscopy;  Laterality: N/A;   COLONOSCOPY WITH PROPOFOL N/A 09/28/2021   Procedure: COLONOSCOPY WITH PROPOFOL;  Surgeon: Regis Bill, MD;  Location: [facility] ENDOSCOPY;   Service: Endoscopy;  Laterality: N/A;   ESOPHAGOGASTRODUODENOSCOPY (EGD) WITH PROPOFOL N/A 09/26/2021   Procedure: ESOPHAGOGASTRODUODENOSCOPY (EGD) WITH PROPOFOL;  Surgeon: Regis Bill, MD;  Location: [facility] ENDOSCOPY;  Service: Endoscopy;  Laterality: N/A;   TONSILLECTOMY     Family History:  Family History  Family history unknown: Yes   Family Psychiatric  History: See H&P  Social History:  Social History   Substance and Sexual Activity  Alcohol Use Yes   Alcohol/week: 4.0 standard drinks of alcohol   Types: 1 Cans of beer, 3 Shots of liquor per week     Social History   Substance and Sexual Activity  Drug Use Not Currently    Social History   Socioeconomic History   Marital status: Married    Spouse name: Not on file   Number of children: Not on file   Years of education: 16   Highest education level: Associate degree: occupational, Scientist, product/process development, or vocational program  Occupational History   Not on file  Tobacco Use   Smoking status: Light Smoker    Types: Cigars   Smokeless tobacco: Never  Vaping Use   Vaping status: Every Day  Substance and Sexual Activity   Alcohol use: Yes    Alcohol/week: 4.0 standard drinks of alcohol    Types: 1 Cans of beer, 3 Shots of liquor per week   Drug use: Not Currently   Sexual activity: Not on file  Other Topics Concern   Not on file  Social History Narrative   Not on file   Social Determinants of Health   Financial Resource Strain: Not on file  Food Insecurity: No Food Insecurity (07/06/2023)   Hunger Vital Sign    Worried About Running Out of Food in the Last Year: Never  true    Ran Out of Food in the Last Year: Never true  Transportation Needs: No Transportation Needs (07/06/2023)   PRAPARE - Administrator, Civil Service (Medical): No    Lack of Transportation (Non-Medical): No  Physical Activity: Not on file  Stress: Not on file  Social Connections: Unknown (03/06/2022)   Received from [facility],  [facility]   Social Network    Social Network: Not on file   Hospital Course:  During the patient's hospitalization, patient had extensive initial psychiatric evaluation, and follow-up psychiatric evaluations every day.  Psychiatric diagnoses provided upon initial assessment:  Principal Problem:   Alcohol use disorder   Depression  Patient's psychiatric medications were adjusted on admission:  Initiate sertraline 25 mg p.o. daily for anxiety / depression Continue trazodone tablets 50 mg p.o. nightly as needed for insomnia Initiate potassium chloride 40 mEq x 1 dose only  Ativan detox protocol with CIWA: See MAR  During the hospitalization, other adjustments were made to the patient's psychiatric medication regimen:  Zoloft was increased to 50 mg p.o. daily for depression Clonidine Catapres 0.1 mg p.o. 1 time only for elevated blood pressure Metoprolol XL 24 mg tablet 25 mg p.o. daily was started for high blood pressure  Patient's care was discussed during the interdisciplinary team meeting every day during the hospitalization.  The patient denies having side effects to prescribed psychiatric medication.  Gradually, patient started adjusting to milieu. The patient was evaluated each day by a clinical provider to ascertain response to treatment. Improvement was noted by the patient's report of decreasing symptoms, improved sleep and appetite, affect, medication tolerance, behavior, and participation in unit programming.  Patient was asked each day to complete a self inventory noting mood, mental status, pain, new symptoms, anxiety and concerns.    Symptoms were reported as significantly decreased or resolved completely by discharge.   On day of discharge, the patient reports that their mood is stable. The patient denied having suicidal thoughts for more than 48 hours prior to discharge.  Patient denies having homicidal thoughts.  Patient denies having auditory hallucinations.  Patient  denies any visual hallucinations or other symptoms of psychosis. The patient was motivated to continue taking medication with a goal of continued improvement in mental health.   The patient reports their target psychiatric symptoms of depression responded well to the psychiatric medications, and the patient reports overall benefit other psychiatric hospitalization. Supportive psychotherapy was provided to the patient. The patient also participated in regular group therapy while hospitalized. Coping skills, problem solving as well as relaxation therapies were also part of the unit programming.  Labs were reviewed with the patient, and abnormal results were discussed with the patient.  The patient is able to verbalize their individual safety plan to this provider.  # It is recommended to the patient to continue psychiatric medications as prescribed, after discharge from the hospital.    # It is recommended to the patient to follow up with your outpatient psychiatric provider and PCP.  # It was discussed with the patient, the impact of alcohol, drugs, tobacco have been there overall psychiatric and medical wellbeing, and total abstinence from substance use was recommended the patient.ed.  # Prescriptions provided or sent directly to preferred pharmacy at discharge. Patient agreeable to plan. Given opportunity to ask questions. Appears to feel comfortable with discharge.    # In the event of worsening symptoms, the patient is instructed to call the crisis hotline, 911 and or  go to the nearest ED for appropriate evaluation and treatment of symptoms. To follow-up with primary care provider for other medical issues, concerns and or health care needs  # Patient was discharged to home with a plan to follow up as noted below.   Addendum: Prior to discharge, patient's blood pressure was consistently elevated at 138/99, pulse 83; 127/95, pulse 84; and 151/92, pulse 75.  Patient reports that he is anxious  about discharge.  Clonidine 0.1 mg p.o. was ordered x 1 dose only, and Toprol-XL 25 mg p.o. daily was added to his treatment plan.  Patient to follow up with outpatient PCP for his elevated blood pressure and encouraged cessation of chronic alcohol consumption.  Physical Findings: AIMS:  , ,  ,  ,    CIWA:  CIWA-Ar Total: 1 COWS:     Musculoskeletal: Strength & Muscle Tone: within normal limits Gait & Station: normal Patient leans: N/A  Psychiatric Specialty Exam:  Presentation  General Appearance:  Appropriate for Environment; Casual; Fairly Groomed  Eye Contact: Good  Speech: Clear and Coherent; Normal Rate  Speech Volume: Normal  Handedness: Right  Mood and Affect  Mood: Euthymic  Affect: Appropriate; Congruent  Thought Process  Thought Processes: Coherent; Goal Directed  Descriptions of Associations:Intact  Orientation:Full (Time, Place and Person)  Thought Content:Logical  History of Schizophrenia/Schizoaffective disorder:No data recorded Duration of Psychotic Symptoms:No data recorded Hallucinations:Hallucinations: None  Ideas of Reference:None  Suicidal Thoughts:Suicidal Thoughts: No  Homicidal Thoughts:Homicidal Thoughts: No  Sensorium  Memory: Immediate Good; Recent Good  Judgment: Good  Insight: Good  Executive Functions  Concentration: Good  Attention Span: Good  Recall: Fair  Fund of Knowledge: Fair  Language: Good  Psychomotor Activity  Psychomotor Activity: Psychomotor Activity: Normal  Assets  Assets: Communication Skills; Desire for Improvement; Housing; Physical Health; Resilience; Social Support; Talents/Skills  Sleep  Sleep: Sleep: Good Number of Hours of Sleep: 7.5  Physical Exam: Physical Exam Vitals and nursing note reviewed.  HENT:     Head: Normocephalic.     Nose: Nose normal.     Mouth/Throat:     Mouth: Mucous membranes are moist.  Eyes:     Extraocular Movements: Extraocular movements  intact.  Cardiovascular:     Rate and Rhythm: Normal rate.     Pulses: Normal pulses.  Pulmonary:     Effort: Pulmonary effort is normal.  Abdominal:     Comments: Deferred  Genitourinary:    Comments: Deferred Musculoskeletal:        General: Normal range of motion.     Cervical back: Normal range of motion.  Skin:    General: Skin is warm.  Neurological:     General: No focal deficit present.     Mental Status: He is alert and oriented to person, place, and time.  Psychiatric:        Mood and Affect: Mood normal.        Behavior: Behavior normal.        Thought Content: Thought content normal.        Judgment: Judgment normal.    Review of Systems  Constitutional:  Negative for chills and fever.  Eyes:  Negative for blurred vision.  Respiratory:  Negative for cough, shortness of breath and wheezing.   Cardiovascular:  Negative for chest pain and palpitations.  Gastrointestinal:  Negative for abdominal pain, heartburn, nausea and vomiting.  Genitourinary:  Negative for dysuria, frequency and urgency.  Musculoskeletal: Negative.   Skin:  Negative for itching and  rash.  Neurological:  Negative for dizziness, tingling, tremors and headaches.  Endo/Heme/Allergies:        See allergy listing  Psychiatric/Behavioral:  Positive for depression (Stable with medication). The patient is nervous/anxious (Improving with medication) and has insomnia (Improved with medication).    Blood pressure 138/88, pulse 69, temperature 99.1 F (37.3 C), temperature source Oral, resp. rate 16, height 5\' 11"  (1.803 m), weight 79.8 kg, SpO2 100%. Body mass index is 24.55 kg/m.  Social History   Tobacco Use  Smoking Status Light Smoker   Types: Cigars  Smokeless Tobacco Never   Tobacco Cessation:  A prescription for an FDA-approved tobacco cessation medication provided at discharge  Blood Alcohol level:  Lab Results  Component Value Date   ETH 238 (H) 07/04/2023   ETH <10 06/05/2023    Metabolic Disorder Labs:  No results found for: "HGBA1C", "MPG" No results found for: "PROLACTIN" No results found for: "CHOL", "TRIG", "HDL", "CHOLHDL", "VLDL", "LDLCALC"  See Psychiatric Specialty Exam and Suicide Risk Assessment completed by Attending Physician prior to discharge.  Discharge destination:  Home  Is patient on multiple antipsychotic therapies at discharge:  No   Has Patient had three or more failed trials of antipsychotic monotherapy by history:  No  Recommended Plan for Multiple Antipsychotic Therapies: NA  Discharge Instructions     Increase activity slowly   Complete by: As directed       Allergies as of 07/11/2023   No Known Allergies      Medication List     STOP taking these medications    ibuprofen 600 MG tablet Commonly known as: ADVIL   omeprazole 20 MG capsule Commonly known as: PRILOSEC       TAKE these medications      Indication  metoprolol succinate 25 MG 24 hr tablet Commonly known as: TOPROL-XL Take 1 tablet (25 mg total) by mouth daily. Start taking on: July 12, 2023  Indication: High Blood Pressure   multivitamin with minerals Tabs tablet Take 1 tablet by mouth daily. Start taking on: July 12, 2023  Indication: Major Depressive Disorder   naphazoline-pheniramine 0.025-0.3 % ophthalmic solution Commonly known as: NAPHCON-A Place 1 drop into both eyes 4 (four) times daily as needed for eye irritation.  Indication: Red Eyes   nicotine 14 mg/24hr patch Commonly known as: NICODERM CQ - dosed in mg/24 hours Place 1 patch (14 mg total) onto the skin daily. Start taking on: July 12, 2023  Indication: Nicotine Addiction   sertraline 50 MG tablet Commonly known as: ZOLOFT Take 1 tablet (50 mg total) by mouth daily. Start taking on: July 12, 2023  Indication: Major Depressive Disorder   thiamine 100 MG tablet Commonly known as: Vitamin B-1 Take 1 tablet (100 mg total) by mouth daily. Start  taking on: July 12, 2023  Indication: Deficiency of Vitamin B1   traZODone 50 MG tablet Commonly known as: DESYREL Take 1 tablet (50 mg total) by mouth at bedtime as needed for sleep.  Indication: Trouble Sleeping        Follow-up Information     [facility]. Go on 07/22/2023.   Specialty: Behavioral Health Why: You have an appointment on 07/22/23 at 2:00 pm, in person for medication management services.  You also have an appointment for therapy services on 09/10/23 at 9:00 am, in person. For faster service, please go on Monday through Friday, arrive by 7:00 am for same day service. Contact information: [address]  [place] [address] [phone]                Follow-up recommendations:   Discharge Recommendations:  The patient is being discharged to home. Patient is to take his discharge medications as ordered.  See follow up above. We recommend that he participates in individual therapy to target uncontrollable agitation and substance abuse.  We recommend that he participates in therapy to target personal conflict, to improve communication skills and conflict resolution skills. Patient is to initiate/implement a contingency based behavioral model to address his behavior. We recommend that he gets AIMS scale, height, weight, blood pressure, fasting lipid panel, fasting blood sugar in three months from discharge if he's on atypical antipsychotics.  Patient will benefit from monitoring of recurrent suicidal ideation since patient is on antidepressant medication. The patient should abstain from all illicit substances and alcohol. If the patient's symptoms worsen or do not continue to improve or if the patient becomes actively suicidal or homicidal then it is recommended that the patient return to the closest hospital emergency room or call 911 for further evaluation and treatment. National Suicide Prevention Lifeline 1800-SUICIDE or  [phone]. Please follow up with your primary medical doctor for all other medical needs.  The patient has been educated on the possible side effects to medications and she/her guardian is to contact a medical professional and inform outpatient provider of any new side effects of medication. He is to take regular diet and activity as tolerated.  Will benefit from moderate daily exercise. Patient and Family was educated about removing/locking any firearms, medications or dangerous products from the home.  Activity:  As tolerated Diet:  Regular Diet  Signed: Cecilie Lowers, FNP 07/11/2023, 1:04 PM

## 2023-07-11 NOTE — Progress Notes (Signed)
   07/11/23 0800  Psych Admission Type (Psych Patients Only)  Admission Status Involuntary  Psychosocial Assessment  Patient Complaints None  Eye Contact Fair  Facial Expression Animated  Affect Appropriate to circumstance  Speech Logical/coherent  Interaction Assertive  Motor Activity Other (Comment) (wnl)  Appearance/Hygiene Unremarkable  Behavior Characteristics Cooperative;Appropriate to situation  Mood Pleasant  Thought Process  Coherency WDL  Content WDL  Delusions None reported or observed  Perception WDL  Hallucination None reported or observed  Judgment WDL  Confusion None  Danger to Self  Current suicidal ideation? Denies  Agreement Not to Harm Self Yes  Description of Agreement verbal  Danger to Others  Danger to Others None reported or observed

## 2023-07-11 NOTE — [facility] Suicide Risk Assessment (Signed)
Suicide Risk Assessment  Discharge Assessment    [facility] Discharge Suicide Risk Assessment   Principal Problem: Alcohol use disorder Discharge Diagnoses: Principal Problem:   Alcohol use disorder  Reason for for admission:  Nicholas Fletcher is a 47 year old AA male with prior psychiatric diagnoses significant for long-term alcoholism and GAD who is involuntarily admitted to [facility] from [name] ED for aggressive behavior towards his wife.  In the context of alcohol intoxication.  After medical evaluation/stabilization & clearance, he was transferred to the [facility] for further psychiatric evaluation & treatments.   Total Time spent with patient: 30 minutes  Musculoskeletal: Strength & Muscle Tone: within normal limits Gait & Station: normal Patient leans: N/A  Psychiatric Specialty Exam  Presentation  General Appearance:  Appropriate for Environment; Casual; Fairly Groomed  Eye Contact: Good  Speech: Clear and Coherent; Normal Rate  Speech Volume: Normal  Handedness: Right   Mood and Affect  Mood: Euthymic  Duration of Depression Symptoms: No data recorded Affect: Congruent  Thought Process  Thought Processes: Coherent  Descriptions of Associations:Intact  Orientation:Full (Time, Place and Person)  Thought Content:Logical  History of Schizophrenia/Schizoaffective disorder:No data recorded Duration of Psychotic Symptoms:No data recorded Hallucinations:Hallucinations: None  Ideas of Reference:None  Suicidal Thoughts:Suicidal Thoughts: No  Homicidal Thoughts:Homicidal Thoughts: No  Sensorium  Memory: Immediate Good; Recent Good  Judgment: Fair  Insight: Fair  Art therapist  Concentration: Good  Attention Span: Good  Recall: Fair  Fund of Knowledge: Fair  Language: Good  Psychomotor Activity  Psychomotor Activity: Psychomotor Activity: Normal  Assets  Assets: Communication Skills; Desire for Improvement; Housing;  Physical Health; Resilience; Social Support  Sleep  Sleep: Sleep: Good  Physical Exam: Physical Exam Vitals and nursing note reviewed.  HENT:     Head: Normocephalic.     Mouth/Throat:     Mouth: Mucous membranes are moist.  Eyes:     Extraocular Movements: Extraocular movements intact.  Cardiovascular:     Rate and Rhythm: Normal rate.     Pulses: Normal pulses.  Pulmonary:     Effort: Pulmonary effort is normal.  Abdominal:     Comments: Deferred  Genitourinary:    Comments: Deferred Musculoskeletal:        General: Normal range of motion.     Cervical back: Normal range of motion.  Skin:    General: Skin is warm.  Neurological:     General: No focal deficit present.     Mental Status: He is alert and oriented to person, place, and time.  Psychiatric:        Mood and Affect: Mood normal.        Behavior: Behavior normal.        Thought Content: Thought content normal.        Judgment: Judgment normal.    Review of Systems  Constitutional:  Negative for chills and fever.  HENT:  Negative for sore throat.   Eyes:  Negative for blurred vision.  Respiratory:  Negative for cough, shortness of breath and wheezing.   Cardiovascular:  Negative for chest pain and palpitations.  Gastrointestinal:  Negative for abdominal pain, heartburn, nausea and vomiting.  Genitourinary:  Negative for dysuria, frequency and urgency.  Musculoskeletal: Negative.   Skin:  Negative for itching and rash.  Neurological:  Positive for seizures (History of alcohol withdrawal seizures). Negative for dizziness, tingling, tremors, sensory change and headaches.  Endo/Heme/Allergies:        See allergy listing  Psychiatric/Behavioral:  Positive for  depression (Stable with medication). The patient is nervous/anxious (Improved with medication) and has insomnia (Improved with medication).    Blood pressure (!) 127/95, pulse 84, temperature 99.1 F (37.3 C), temperature source Oral, resp. rate 16,  height 5\' 11"  (1.803 m), weight 79.8 kg, SpO2 100%. Body mass index is 24.55 kg/m.  Mental Status Per Nursing Assessment::   On Admission:  NA  Demographic Factors:  Male  Loss Factors: Financial problems/change in socioeconomic status  Historical Factors: NA  Risk Reduction Factors:   Employed, Positive social support, Positive therapeutic relationship, and Positive coping skills or problem solving skills  Continued Clinical Symptoms:  Depression:   Comorbid alcohol abuse/dependence Recent sense of peace/wellbeing Alcohol/Substance Abuse/Dependencies Unstable or Poor Therapeutic Relationship Medical Diagnoses and Treatments/Surgeries  Cognitive Features That Contribute To Risk:  Polarized thinking    Suicide Risk:  Mild: There are no identifiable plans, no associated intent, mild dysphoria and related symptoms, good self-control (both objective and subjective assessment), few other risk factors, and identifiable protective factors, including available and accessible social support.   Follow-up Information     [facility]. Go on 07/22/2023.   Specialty: Behavioral Health Why: You have an appointment on 07/22/23 at 2:00 pm, in person for medication management services.  You also have an appointment for therapy services on 09/10/23 at 9:00 am, in person. For faster service, please go on Monday through Friday, arrive by 7:00 am for same day service. Contact information: [address] [place] [address] [phone]               Plan Of Care/Follow-up recommendations:  Discharge Recommendations:  The patient is being discharged to home. Patient is to take his discharge medications as ordered.  See follow up above. We recommend that he participates in individual therapy to target uncontrollable agitation and substance abuse.  We recommend that he participates in therapy to target personal conflict, to improve communication skills  and conflict resolution skills. Patient is to initiate/implement a contingency based behavioral model to address his behavior. We recommend that he gets AIMS scale, height, weight, blood pressure, fasting lipid panel, fasting blood sugar in three months from discharge if he's on atypical antipsychotics.  Patient will benefit from monitoring of recurrent suicidal ideation since patient is on antidepressant medication. The patient should abstain from all illicit substances and alcohol. If the patient's symptoms worsen or do not continue to improve or if the patient becomes actively suicidal or homicidal then it is recommended that the patient return to the closest hospital emergency room or call 911 for further evaluation and treatment. National Suicide Prevention Lifeline 1800-SUICIDE or 774-618-5782. Please follow up with your primary medical doctor for all other medical needs.  The patient has been educated on the possible side effects to medications and she/her guardian is to contact a medical professional and inform outpatient provider of any new side effects of medication. He is to take regular diet and activity as tolerated.  Will benefit from moderate daily exercise. Patient and Family was educated about removing/locking any firearms, medications or dangerous products from the home.  Activity:  As tolerated Diet:  Regular Diet  Cecilie Lowers, FNP 07/11/2023, 8:44 AM

## 2023-07-11 NOTE — BHH Counselor (Signed)
  07/11/2023  9:08 AM   Nicholas Fletcher  Type of note: Discharge note   Wife will pick Pt up today around 3pm.   Signed:  Marya Landry MSW, LCSWA 07/11/2023  9:08 AM

## 2023-07-11 NOTE — Plan of Care (Signed)
Problem: Education: Goal: Knowledge of [facility] Education information/materials will improve Outcome: Progressing Goal: Emotional status will improve Outcome: Progressing Goal: Mental status will improve Outcome: Progressing Goal: Verbalization of understanding the information provided will improve Outcome: Progressing   Problem: Activity: Goal: Interest or engagement in activities will improve Outcome: Progressing

## 2023-07-11 NOTE — Group Note (Signed)
LCSW Group Therapy Note   Group Date: 07/11/2023 Start Time: 1100 End Time: 1200  LCSW Group Therapy Note     07/11/2023 12:51 PM     Type of Therapy and Topic:  Group Therapy:  Strengths     Participation Level:  Active     Description of Group: In this group patients will be encouraged to explore personal strengths that are conducive to recovery and well-being. They will be guided to discuss their thoughts, feelings, and behaviors related to these strengths. The group will process together ways that individualized strengths help patients build resilience and facilitate positive treatment outcomes. Each patient will be challenged to identify personal strengths that they foster as well as positive characteristics they would like to embody as they progress through treatment.This group will be process-oriented, with patients participating in exploration of their own experiences as well as giving and receiving support and challenge from other group members.     Therapeutic Goals:  1.    Patient will identify a collaborative list of positive characteristics that promote positive treatment outcomes and well-being.  2.    Patient will identify three personal strengths that they currently exemplify.  3.    Patient will identify feelings, thought process and behaviors related to these strengths.  4.    Patient will identify two ways they will use these personal strengths to help them reach their individualized treatment goals.         Summary of Patient Progress   Pt was active in group        Therapeutic Modalities:    Cognitive Behavioral Therapy  Solution Focused Therapy  Motivational Interviewing   Izell [place], Theresia Majors 07/11/2023  12:51 PM

## 2023-07-11 NOTE — Progress Notes (Signed)
  [facility] Adult Case Management Discharge Plan :  Will you be returning to the same living situation after discharge:  Yes,  Pt will be returning home with Wife. At discharge, do you have transportation home?: Yes,  Wife will pick Pt up around 3pm Do you have the ability to pay for your medications: No.  Release of information consent forms completed and in the chart;  Patient's signature needed at discharge.  Patient to Follow up at:  Follow-up Information     [facility] Go on 07/22/2023.   Specialty: Behavioral Health Why: You have an appointment on 07/22/23 at 2:00 pm, in person for medication management services.  You also have an appointment for therapy services on 09/10/23 at 9:00 am, in person. For faster service, please go on Monday through Friday, arrive by 7:00 am for same day service. Contact information: [address] [place] [address] [phone]                Next level of care provider has access to [facility] Link:no  Safety Planning and Suicide Prevention discussed: Yes,  Rayquan Viau (Spouse) [phone]     Has patient been referred to the Quitline?: Patient refused referral for treatment  Patient has been referred for addiction treatment: Patient refused referral for treatment.  Izell Hector, LCSW 07/11/2023, 9:05 AM

## 2023-07-11 NOTE — BHH Suicide Risk Assessment (Deleted)
Suicide Risk Assessment  Discharge Assessment    [facility] Discharge Suicide Risk Assessment   Principal Problem: Alcohol use disorder Discharge Diagnoses: Principal Problem:   Alcohol use disorder  Reason for admission:  Nicholas Fletcher is a 47 year old AA male with prior psychiatric diagnoses significant for long-term alcoholism and GAD who is involuntarily admitted to [facility] from [name], ED for aggressive behavior towards his wife.  In the context of alcohol intoxication.  After medical evaluation/stabilization & clearance, he was transferred to the [facility] for further psychiatric evaluation & treatments.   Total Time spent with patient: 30 minutes  Musculoskeletal: Strength & Muscle Tone: within normal limits Gait & Station: normal Patient leans: N/A  Psychiatric Specialty Exam  Presentation  General Appearance:  Appropriate for Environment; Casual; Fairly Groomed  Eye Contact: Good  Speech: Clear and Coherent; Normal Rate  Speech Volume: Normal  Handedness: Right  Mood and Affect  Mood: Euthymic  Duration of Depression Symptoms: No data recorded Affect: Appropriate; Congruent  Thought Process  Thought Processes: Coherent; Goal Directed  Descriptions of Associations:Intact  Orientation:Full (Time, Place and Person)  Thought Content:Logical  History of Schizophrenia/Schizoaffective disorder:No data recorded Duration of Psychotic Symptoms:No data recorded Hallucinations:Hallucinations: None  Ideas of Reference:None  Suicidal Thoughts:Suicidal Thoughts: No  Homicidal Thoughts:Homicidal Thoughts: No  Sensorium  Memory: Immediate Good; Recent Good  Judgment: Good  Insight: Good  Executive Functions  Concentration: Good  Attention Span: Good  Recall: Fair  Fund of Knowledge: Fair  Language: Good  Psychomotor Activity  Psychomotor Activity: Psychomotor Activity: Normal  Assets  Assets: Communication Skills; Desire for  Improvement; Housing; Physical Health; Resilience; Social Support; Talents/Skills  Sleep  Sleep: Sleep: Good Number of Hours of Sleep: 7.5  Physical Exam: Physical Exam Vitals and nursing note reviewed.  HENT:     Head: Normocephalic.     Nose: Nose normal.     Mouth/Throat:     Mouth: Mucous membranes are moist.  Eyes:     Extraocular Movements: Extraocular movements intact.  Cardiovascular:     Rate and Rhythm: Normal rate.     Pulses: Normal pulses.  Pulmonary:     Effort: Pulmonary effort is normal.  Abdominal:     Comments: Deferred  Genitourinary:    Comments: Deferred Musculoskeletal:        General: Normal range of motion.     Cervical back: Normal range of motion.  Skin:    General: Skin is warm.  Neurological:     General: No focal deficit present.     Mental Status: He is alert and oriented to person, place, and time.  Psychiatric:        Mood and Affect: Mood normal.        Behavior: Behavior normal.        Thought Content: Thought content normal.        Judgment: Judgment normal.    Review of Systems  Constitutional:  Negative for chills and fever.  HENT:  Negative for sore throat.   Eyes:  Negative for blurred vision.  Respiratory:  Negative for cough, shortness of breath and wheezing.   Cardiovascular:  Negative for chest pain and palpitations.  Gastrointestinal:  Negative for abdominal pain, heartburn, nausea and vomiting.  Genitourinary:  Negative for dysuria, frequency and urgency.  Musculoskeletal: Negative.   Skin:  Negative for itching and rash.  Neurological:  Negative for dizziness, tingling, tremors and headaches.  Endo/Heme/Allergies:        See allergy listing  Psychiatric/Behavioral:  Positive for depression (Stable with medication). The patient is nervous/anxious (Improving with medication) and has insomnia (Improved with medication).    Blood pressure 138/88, pulse 69, temperature 99.1 F (37.3 C), temperature source Oral, resp.  rate 16, height 5\' 11"  (1.803 m), weight 79.8 kg, SpO2 100%. Body mass index is 24.55 kg/m.  Mental Status Per Nursing Assessment::   On Admission:  NA  Demographic Factors:  Male  Loss Factors: Financial problems/change in socioeconomic status  Historical Factors: NA  Risk Reduction Factors:   Sense of responsibility to family, Employed, Positive social support, Positive therapeutic relationship, and Positive coping skills or problem solving skills  Continued Clinical Symptoms:  Depression:   Recent sense of peace/wellbeing Alcohol/Substance Abuse/Dependencies Previous Psychiatric Diagnoses and Treatments Medical Diagnoses and Treatments/Surgeries  Cognitive Features That Contribute To Risk:  Polarized thinking    Suicide Risk:  Mild: There are no identifiable plans, no associated intent, mild dysphoria and related symptoms, good self-control (both objective and subjective assessment), few other risk factors, and identifiable protective factors, including available and accessible social support.   Follow-up Information     [facility]. Go on 07/22/2023.   Specialty: Behavioral Health Why: You have an appointment on 07/22/23 at 2:00 pm, in person for medication management services.  You also have an appointment for therapy services on 09/10/23 at 9:00 am, in person. For faster service, please go on Monday through Friday, arrive by 7:00 am for same day service. Contact information: [address] [place] [address] [phone]                Plan Of Care/Follow-up recommendations:  Discharge Recommendations:  The patient is being discharged to home. Patient is to take his discharge medications as ordered.  See follow up above. We recommend that he participates in individual therapy to target uncontrollable agitation and substance abuse.  We recommend that he participates in therapy to target personal conflict, to improve  communication skills and conflict resolution skills. Patient is to initiate/implement a contingency based behavioral model to address his behavior. We recommend that he gets AIMS scale, height, weight, blood pressure, fasting lipid panel, fasting blood sugar in three months from discharge if he's on atypical antipsychotics.  Patient will benefit from monitoring of recurrent suicidal ideation since patient is on antidepressant medication. The patient should abstain from all illicit substances and alcohol. If the patient's symptoms worsen or do not continue to improve or if the patient becomes actively suicidal or homicidal then it is recommended that the patient return to the closest hospital emergency room or call 911 for further evaluation and treatment. National Suicide Prevention Lifeline 1800-SUICIDE or (506)185-8580. Please follow up with your primary medical doctor for all other medical needs.  The patient has been educated on the possible side effects to medications and she/her guardian is to contact a medical professional and inform outpatient provider of any new side effects of medication. He is to take regular diet and activity as tolerated.  Will benefit from moderate daily exercise. Patient and Family was educated about removing/locking any firearms, medications or dangerous products from the home.  Activity:  As tolerated Diet:  Regular Diet  Cecilie Lowers, FNP 07/11/2023, 12:52 PM

## 2023-07-15 NOTE — BHH Group Notes (Signed)
Spiritual care group on grief and loss facilitated by Chaplain Dyanne Carrel, Bcc and Arlyce Dice, Mdiv  Group Goal: Support / Education around grief and loss  Members engage in facilitated group support and psycho-social education.  Group Description:  Following introductions and group rules, group members engaged in facilitated group dialogue and support around topic of loss, with particular support around experiences of loss in their lives. Group Identified types of loss (relationships / self / things) and identified patterns, circumstances, and changes that precipitate losses. Reflected on thoughts / feelings around loss, normalized grief responses, and recognized variety in grief experience. Group encouraged individual reflection on safe space and on the coping skills that they are already utilizing.  Group drew on Adlerian / Rogerian and narrative framework  Patient Progress: Nicholas Fletcher attended group and actively engaged and participated in group conversation and activities. Comments demonstrated good insight and contributed positively to group conversation.

## 2023-07-22 ENCOUNTER — Other Ambulatory Visit: Payer: Self-pay

## 2023-07-22 ENCOUNTER — Ambulatory Visit (HOSPITAL_COMMUNITY): Payer: No Typology Code available for payment source | Admitting: Psychiatry

## 2023-08-26 ENCOUNTER — Emergency Department (HOSPITAL_COMMUNITY): Payer: 59

## 2023-08-26 ENCOUNTER — Inpatient Hospital Stay (HOSPITAL_COMMUNITY): Payer: 59

## 2023-08-26 ENCOUNTER — Inpatient Hospital Stay (HOSPITAL_COMMUNITY)
Admission: EM | Admit: 2023-08-26 | Discharge: 2023-08-29 | DRG: 897 | Disposition: A | Discharge status: Home / Self Care | Payer: 59 | Attending: Internal Medicine | Admitting: Internal Medicine

## 2023-08-26 ENCOUNTER — Encounter (HOSPITAL_COMMUNITY): Payer: Self-pay | Admitting: Family Medicine

## 2023-08-26 DIAGNOSIS — I6782 Cerebral ischemia: Secondary | ICD-10-CM | POA: Diagnosis not present

## 2023-08-26 DIAGNOSIS — K760 Fatty (change of) liver, not elsewhere classified: Secondary | ICD-10-CM | POA: Diagnosis not present

## 2023-08-26 DIAGNOSIS — F10932 Alcohol use, unspecified with withdrawal with perceptual disturbance: Secondary | ICD-10-CM

## 2023-08-26 DIAGNOSIS — E871 Hypo-osmolality and hyponatremia: Secondary | ICD-10-CM | POA: Diagnosis not present

## 2023-08-26 DIAGNOSIS — Z79899 Other long term (current) drug therapy: Secondary | ICD-10-CM

## 2023-08-26 DIAGNOSIS — K219 Gastro-esophageal reflux disease without esophagitis: Secondary | ICD-10-CM | POA: Diagnosis present

## 2023-08-26 DIAGNOSIS — F1090 Alcohol use, unspecified, uncomplicated: Secondary | ICD-10-CM | POA: Diagnosis present

## 2023-08-26 DIAGNOSIS — E861 Hypovolemia: Secondary | ICD-10-CM | POA: Diagnosis not present

## 2023-08-26 DIAGNOSIS — K838 Other specified diseases of biliary tract: Secondary | ICD-10-CM | POA: Diagnosis not present

## 2023-08-26 DIAGNOSIS — I10 Essential (primary) hypertension: Secondary | ICD-10-CM | POA: Diagnosis not present

## 2023-08-26 DIAGNOSIS — K7010 Alcoholic hepatitis without ascites: Secondary | ICD-10-CM | POA: Diagnosis present

## 2023-08-26 DIAGNOSIS — R42 Dizziness and giddiness: Secondary | ICD-10-CM | POA: Diagnosis not present

## 2023-08-26 DIAGNOSIS — F419 Anxiety disorder, unspecified: Secondary | ICD-10-CM | POA: Diagnosis present

## 2023-08-26 DIAGNOSIS — Z7982 Long term (current) use of aspirin: Secondary | ICD-10-CM

## 2023-08-26 DIAGNOSIS — R531 Weakness: Secondary | ICD-10-CM | POA: Diagnosis not present

## 2023-08-26 DIAGNOSIS — F10231 Alcohol dependence with withdrawal delirium: Principal | ICD-10-CM | POA: Diagnosis present

## 2023-08-26 DIAGNOSIS — R066 Hiccough: Secondary | ICD-10-CM | POA: Diagnosis not present

## 2023-08-26 DIAGNOSIS — R17 Unspecified jaundice: Secondary | ICD-10-CM | POA: Diagnosis not present

## 2023-08-26 DIAGNOSIS — R9431 Abnormal electrocardiogram [ECG] [EKG]: Secondary | ICD-10-CM | POA: Diagnosis not present

## 2023-08-26 DIAGNOSIS — E876 Hypokalemia: Secondary | ICD-10-CM | POA: Diagnosis not present

## 2023-08-26 DIAGNOSIS — R1110 Vomiting, unspecified: Secondary | ICD-10-CM | POA: Diagnosis not present

## 2023-08-26 DIAGNOSIS — F10939 Alcohol use, unspecified with withdrawal, unspecified: Secondary | ICD-10-CM | POA: Diagnosis present

## 2023-08-26 DIAGNOSIS — F10232 Alcohol dependence with withdrawal with perceptual disturbance: Principal | ICD-10-CM | POA: Diagnosis present

## 2023-08-26 DIAGNOSIS — R7989 Other specified abnormal findings of blood chemistry: Secondary | ICD-10-CM | POA: Diagnosis present

## 2023-08-26 DIAGNOSIS — F17290 Nicotine dependence, other tobacco product, uncomplicated: Secondary | ICD-10-CM | POA: Diagnosis present

## 2023-08-26 DIAGNOSIS — R079 Chest pain, unspecified: Secondary | ICD-10-CM | POA: Diagnosis not present

## 2023-08-26 DIAGNOSIS — R110 Nausea: Secondary | ICD-10-CM | POA: Diagnosis not present

## 2023-08-26 DIAGNOSIS — R109 Unspecified abdominal pain: Secondary | ICD-10-CM | POA: Diagnosis not present

## 2023-08-26 LAB — URINALYSIS, ROUTINE W REFLEX MICROSCOPIC
Bacteria, UA: NONE SEEN
Bilirubin Urine: NEGATIVE
Glucose, UA: NEGATIVE mg/dL
Ketones, ur: 20 mg/dL — AB
Leukocytes,Ua: NEGATIVE
Nitrite: NEGATIVE
Protein, ur: 30 mg/dL — AB
Specific Gravity, Urine: 1.012 (ref 1.005–1.030)
pH: 5 (ref 5.0–8.0)

## 2023-08-26 LAB — RAPID URINE DRUG SCREEN, HOSP PERFORMED
Amphetamines: NOT DETECTED
Barbiturates: NOT DETECTED
Benzodiazepines: NOT DETECTED
Cocaine: NOT DETECTED
Opiates: NOT DETECTED
Tetrahydrocannabinol: NOT DETECTED

## 2023-08-26 LAB — COMPREHENSIVE METABOLIC PANEL
ALT: 106 U/L — ABNORMAL HIGH (ref 0–44)
AST: 370 U/L — ABNORMAL HIGH (ref 15–41)
Albumin: 4 g/dL (ref 3.5–5.0)
Alkaline Phosphatase: 87 U/L (ref 38–126)
Anion gap: 28 — ABNORMAL HIGH (ref 5–15)
BUN: 5 mg/dL — ABNORMAL LOW (ref 6–20)
CO2: 23 mmol/L (ref 22–32)
Calcium: 9.1 mg/dL (ref 8.9–10.3)
Chloride: 72 mmol/L — ABNORMAL LOW (ref 98–111)
Creatinine, Ser: 0.99 mg/dL (ref 0.61–1.24)
GFR, Estimated: >60 mL/min (ref >60)
Glucose, Bld: 156 mg/dL — ABNORMAL HIGH (ref 70–99)
Potassium: 2.9 mmol/L — ABNORMAL LOW (ref 3.5–5.1)
Sodium: 123 mmol/L — ABNORMAL LOW (ref 135–145)
Total Bilirubin: 4.4 mg/dL — ABNORMAL HIGH (ref <1.2)
Total Protein: 7.1 g/dL (ref 6.5–8.1)

## 2023-08-26 LAB — CBC
HCT: 43.7 % (ref 39.0–52.0)
Hemoglobin: 16.4 g/dL (ref 13.0–17.0)
MCH: 34.3 pg — ABNORMAL HIGH (ref 26.0–34.0)
MCHC: 37.5 g/dL — ABNORMAL HIGH (ref 30.0–36.0)
MCV: 91.4 fL (ref 80.0–100.0)
Platelets: 265 10*3/uL (ref 150–400)
RBC: 4.78 MIL/uL (ref 4.22–5.81)
RDW: 11.9 % (ref 11.5–15.5)
WBC: 12.9 10*3/uL — ABNORMAL HIGH (ref 4.0–10.5)
nRBC: 0 % (ref 0.0–0.2)

## 2023-08-26 LAB — LIPASE, BLOOD: Lipase: 33 U/L (ref 11–51)

## 2023-08-26 LAB — TROPONIN I (HIGH SENSITIVITY)
Troponin I (High Sensitivity): 4 ng/L (ref <18)
Troponin I (High Sensitivity): 4 ng/L (ref <18)

## 2023-08-26 LAB — ETHANOL: Alcohol, Ethyl (B): <10 mg/dL (ref <10)

## 2023-08-26 LAB — AMMONIA: Ammonia: 18 umol/L (ref 9–35)

## 2023-08-26 MED ORDER — LORAZEPAM 2 MG/ML IJ SOLN
1.0000 mg | INTRAMUSCULAR | Status: DC | PRN
  Administered 2023-08-26 – 2023-08-28 (×2): 2 mg via INTRAVENOUS

## 2023-08-26 MED ORDER — LORAZEPAM 2 MG/ML IJ SOLN
0.0000 mg | Freq: Three times a day (TID) | INTRAMUSCULAR | Status: DC
  Filled 2023-08-26: qty 1

## 2023-08-26 MED ORDER — ONDANSETRON HCL 4 MG/2ML IJ SOLN
4.0000 mg | Freq: Once | INTRAMUSCULAR | Status: AC
  Administered 2023-08-26: 4 mg via INTRAVENOUS
  Filled 2023-08-26: qty 2

## 2023-08-26 MED ORDER — ADULT MULTIVITAMIN W/MINERALS CH
1.0000 | ORAL_TABLET | Freq: Every day | ORAL | Status: DC
  Administered 2023-08-27 – 2023-08-29 (×3): 1 via ORAL
  Filled 2023-08-26 (×3): qty 1

## 2023-08-26 MED ORDER — POTASSIUM CHLORIDE 20 MEQ PO PACK
40.0000 meq | PACK | Freq: Once | ORAL | Status: AC
  Administered 2023-08-26: 40 meq via ORAL
  Filled 2023-08-26: qty 2

## 2023-08-26 MED ORDER — SODIUM CHLORIDE 0.9 % IV BOLUS
1000.0000 mL | Freq: Once | INTRAVENOUS | Status: AC
  Administered 2023-08-26: 1000 mL via INTRAVENOUS

## 2023-08-26 MED ORDER — LORAZEPAM 2 MG/ML IJ SOLN
1.0000 mg | Freq: Once | INTRAMUSCULAR | Status: AC
  Administered 2023-08-26: 1 mg via INTRAVENOUS
  Filled 2023-08-26: qty 1

## 2023-08-26 MED ORDER — OXYCODONE HCL 5 MG PO TABS
5.0000 mg | ORAL_TABLET | ORAL | Status: DC | PRN
  Administered 2023-08-27: 5 mg via ORAL
  Filled 2023-08-26: qty 1

## 2023-08-26 MED ORDER — THIAMINE MONONITRATE 100 MG PO TABS
100.0000 mg | ORAL_TABLET | Freq: Every day | ORAL | Status: DC
  Administered 2023-08-27 – 2023-08-29 (×3): 100 mg via ORAL
  Filled 2023-08-26 (×3): qty 1

## 2023-08-26 MED ORDER — THIAMINE HCL 100 MG/ML IJ SOLN: 100.0000 mg | Freq: Every day | INTRAMUSCULAR | Status: DC

## 2023-08-26 MED ORDER — FOLIC ACID 1 MG PO TABS
1.0000 mg | ORAL_TABLET | Freq: Every day | ORAL | Status: DC
  Administered 2023-08-27 – 2023-08-29 (×3): 1 mg via ORAL
  Filled 2023-08-26 (×3): qty 1

## 2023-08-26 MED ORDER — SODIUM CHLORIDE 0.9% FLUSH
3.0000 mL | Freq: Two times a day (BID) | INTRAVENOUS | Status: DC
  Administered 2023-08-27 – 2023-08-29 (×5): 3 mL via INTRAVENOUS

## 2023-08-26 MED ORDER — POTASSIUM CHLORIDE 10 MEQ/100ML IV SOLN
10.0000 meq | INTRAVENOUS | Status: AC
  Administered 2023-08-26 (×3): 10 meq via INTRAVENOUS
  Filled 2023-08-26 (×3): qty 100

## 2023-08-26 MED ORDER — LORAZEPAM 1 MG PO TABS
1.0000 mg | ORAL_TABLET | ORAL | Status: DC | PRN
  Administered 2023-08-27 (×3): 1 mg via ORAL
  Filled 2023-08-26 (×3): qty 1

## 2023-08-26 MED ORDER — THIAMINE HCL 100 MG/ML IJ SOLN
100.0000 mg | Freq: Once | INTRAMUSCULAR | Status: AC
  Administered 2023-08-26: 100 mg via INTRAVENOUS
  Filled 2023-08-26: qty 2

## 2023-08-26 MED ORDER — ENOXAPARIN SODIUM 40 MG/0.4ML IJ SOSY
40.0000 mg | PREFILLED_SYRINGE | INTRAMUSCULAR | Status: DC
  Administered 2023-08-26 – 2023-08-28 (×3): 40 mg via SUBCUTANEOUS
  Filled 2023-08-26 (×3): qty 0.4

## 2023-08-26 MED ORDER — POLYETHYLENE GLYCOL 3350 17 G PO PACK: 17.0000 g | PACK | Freq: Every day | ORAL | Status: DC | PRN

## 2023-08-26 MED ORDER — LORAZEPAM 2 MG/ML IJ SOLN
0.0000 mg | INTRAMUSCULAR | Status: AC
  Filled 2023-08-26: qty 1

## 2023-08-26 NOTE — ED Provider Notes (Signed)
[facility] AT [facility] Provider Note   CSN: [id number] Arrival date & time: 08/26/23  1849     History  Chief Complaint  Patient presents with   Weakness   Alcohol Intoxication   Dizziness   Abdominal Pain   Chest Pain    Nicholas Fletcher is a 47 y.o. male.   Weakness Associated symptoms: abdominal pain, chest pain and dizziness   Alcohol Intoxication Associated symptoms include chest pain and abdominal pain.  Dizziness Associated symptoms: chest pain and weakness   Abdominal Pain Associated symptoms: chest pain   Chest Pain Associated symptoms: abdominal pain, dizziness and weakness   Patient presents a few different complaints.  Generalized weakness.  States has had decreased oral intake.  Decreased appetite.  Has had abdominal pain.  States that he has been trying to cut back on his drinking for a couple months now.  States he would drink 3 or 4 shots a day but has been doing half of that for the last couple months.  Reportedly last drink this morning.  Wife reportedly called EMS for slurred speech and possible facial droop.  Patient states he has been anxious and felt sweaty.  States he has a history of seizures with alcohol withdrawal in the past.     Home Medications Prior to Admission medications   Medication Sig Start Date End Date Taking? Authorizing Provider  acetaminophen (TYLENOL) 500 MG tablet Take 1,000 mg by mouth every 6 (six) hours as needed.   Yes [provider]  aspirin EC 81 MG tablet Take 81-162 mg by mouth daily as needed for mild pain (pain score 1-3) or moderate pain (pain score 4-6). Swallow whole.   Yes [provider]  Multiple Vitamin (MULTIVITAMIN WITH MINERALS) TABS tablet Take 1 tablet by mouth daily. 07/12/23  Yes Ntuen, Jesusita Oka, FNP  Multiple Vitamins-Minerals (MENS MULTIVITAMIN PO) Take 1 each by mouth daily.   Yes [provider]  naphazoline-pheniramine (NAPHCON-A) 0.025-0.3 %  ophthalmic solution Place 1 drop into both eyes 4 (four) times daily as needed for eye irritation. 07/11/23  Yes Ntuen, Jesusita Oka, FNP  thiamine (VITAMIN B-1) 100 MG tablet Take 1 tablet (100 mg total) by mouth daily. Patient taking differently: Take 100 mg by mouth daily as needed. 07/12/23  Yes Ntuen, Jesusita Oka, FNP  traZODone (DESYREL) 50 MG tablet Take 1 tablet (50 mg total) by mouth at bedtime as needed for sleep. 07/11/23  Yes Ntuen, Jesusita Oka, FNP      Allergies    Patient has no known allergies.    Review of Systems   Review of Systems  Cardiovascular:  Positive for chest pain.  Gastrointestinal:  Positive for abdominal pain.  Neurological:  Positive for dizziness and weakness.    Physical Exam Updated Vital Signs BP (!) 170/96   Pulse 86   Temp 98.3 F (36.8 C) (Oral)   Resp (!) 21   Ht 6' (1.829 m)   Wt 81.6 kg   SpO2 99%   BMI 24.41 kg/m  Physical Exam Vitals and nursing note reviewed.  Cardiovascular:     Rate and Rhythm: Tachycardia present.  Pulmonary:     Breath sounds: No wheezing or rhonchi.  Abdominal:     Tenderness: There is abdominal tenderness.     Hernia: No hernia is present.     Comments: Left upper quadrant tenderness without rebound or guarding.  No hernia palpated.  Skin:    General: Skin is  warm.     Capillary Refill: Capillary refill takes less than 2 seconds.  Neurological:     Mental Status: He is alert and oriented to person, place, and time.     ED Results / Procedures / Treatments   Labs (all labs ordered are listed, but only abnormal results are displayed) Labs Reviewed  CBC - Abnormal; Notable for the following components:      Result Value   WBC 12.9 (*)    MCH 34.3 (*)    MCHC 37.5 (*)    All other components within normal limits  URINALYSIS, ROUTINE W REFLEX MICROSCOPIC - Abnormal; Notable for the following components:   Color, Urine AMBER (*)    Hgb urine dipstick SMALL (*)    Ketones, ur 20 (*)    Protein, ur 30 (*)    All  other components within normal limits  COMPREHENSIVE METABOLIC PANEL - Abnormal; Notable for the following components:   Sodium 123 (*)    Potassium 2.9 (*)    Chloride 72 (*)    Glucose, Bld 156 (*)    BUN 5 (*)    AST 370 (*)    ALT 106 (*)    Total Bilirubin 4.4 (*)    Anion gap 28 (*)    All other components within normal limits  ETHANOL  LIPASE, BLOOD  AMMONIA  RAPID URINE DRUG SCREEN, HOSP PERFORMED  MAGNESIUM  PHOSPHORUS  TROPONIN I (HIGH SENSITIVITY)  TROPONIN I (HIGH SENSITIVITY)    EKG EKG Interpretation Date/Time:  Monday August 26 2023 20:28:35 EST Ventricular Rate:  88 PR Interval:  179 QRS Duration:  111 QT Interval:  470 QTC Calculation: 569 R Axis:   80  Text Interpretation: Sinus rhythm Right atrial enlargement Anteroseptal infarct, old Nonspecific T abnormalities, lateral leads Prolonged QT interval Confirmed by Benjiman Core [phone]) on 08/26/2023 9:50:57 PM  Radiology DG Chest Portable 1 View  Result Date: 08/26/2023 CLINICAL DATA:  Chest pain EXAM: PORTABLE CHEST 1 VIEW COMPARISON:  06/24/2022 FINDINGS: The heart size and mediastinal contours are within normal limits. Both lungs are clear. The visualized skeletal structures are unremarkable. IMPRESSION: No active disease. Electronically Signed   By: Charlett Nose M.D.   On: 08/26/2023 20:24    Procedures Procedures    Medications Ordered in ED Medications  potassium chloride 10 mEq in 100 mL IVPB (10 mEq Intravenous New Bag/Given 08/26/23 2200)  sodium chloride 0.9 % bolus 1,000 mL (0 mLs Intravenous Stopped 08/26/23 2059)  ondansetron (ZOFRAN) injection 4 mg (4 mg Intravenous Given 08/26/23 1949)  LORazepam (ATIVAN) injection 1 mg (1 mg Intravenous Given 08/26/23 2009)  thiamine (VITAMIN B1) injection 100 mg (100 mg Intravenous Given 08/26/23 2204)    ED Course/ Medical Decision Making/ A&P                                 Medical Decision Making Amount and/or Complexity of Data  Reviewed Labs: ordered. Radiology: ordered.  Risk Prescription drug management. Decision regarding hospitalization.   Patient feeling better.  Multi complaints.  Reportedly slurred speech.  However speech seems appropriate now.  No facial droop seen.  Good movement of all extremities.  Does have tenderness to the abdomen.  Decreased oral intake.  Does have tachycardia and high blood pressure.  States that may be withdrawing from alcohol.  Reportedly has had some hallucinations.  Has had previous head CT with previous seizure but will  recheck now with hallucinations and potential neurodeficits.  Had been given Ativan when patient was feeling more anxious. Also discussed with the patient's wife and reviewed previous psychiatry note.  With the mild hallucinations potentially could have component of severe alcohol withdrawal.  Will need close monitoring.  Has had hyponatremia and hypochloremia.  Likely due to alcohol intake.  LFTs elevated but appear to have been similar to this in the past.  Think also likely related to alcohol.  Alcohol level is less than 10.  May have a component of withdrawal.  Feeling better after fluids and Ativan.  Will supplement potassium.  However think with hypokalemia and other electrolyte abnormalities will benefit from mission to the hospital.  Will discuss with unassigned medicine.  CRITICAL CARE Performed by: Benjiman Core Total critical care time: 30 minutes Critical care time was exclusive of separately billable procedures and treating other patients. Critical care was necessary to treat or prevent imminent or life-threatening deterioration. Critical care was time spent personally by me on the following activities: development of treatment plan with patient and/or surrogate as well as nursing, discussions with consultants, evaluation of patient's response to treatment, examination of patient, obtaining history from patient or surrogate, ordering and performing  treatments and interventions, ordering and review of laboratory studies, ordering and review of radiographic studies, pulse oximetry and re-evaluation of patient's condition.         Final Clinical Impression(s) / ED Diagnoses Final diagnoses:  Hyponatremia  Alcohol use disorder  Alcohol withdrawal syndrome with perceptual disturbance [facility])    Rx / DC Orders ED Discharge Orders     None         Benjiman Core, MD 08/26/23 2210

## 2023-08-26 NOTE — ED Notes (Signed)
Notified Dr. Rubin Payor that the patient C/O CP/pressure, EKG obtained and given to Dr. Rubin Payor, no new orders

## 2023-08-26 NOTE — H&P (Signed)
History and Physical    Nicholas Fletcher YQM:[id number] DOB: [birth year] DOA: 08/26/2023  PCP: Pcp, No   Patient coming from: Home   Chief Complaint: Slurred speech, upper abdominal pain, nausea, hallucinations  HPI: Nicholas Fletcher is a 47 y.o. male with medical history significant for anxiety disorder and alcohol abuse who complains of epigastric abdominal pain and nausea.   Patient's wife noted dysarthria and facial droop and called EMS.  EMS did not appreciate dysarthria or facial droop but the patient was tremulous, hypertensive, and tachycardic, noted to have tactile disturbances and hallucinations, and complained of upper abdominal pain and nausea.    Patient drinks liquor daily, usually 8-10 drinks per day.  Recently, he has been attempting to cut back, has been drinking nonalcoholic beer, and attending AA meetings per report of his wife.  Today, he developed generalized weakness, tremor, hallucinations, and has been experiencing upper abdominal pain and nausea.  He has had a few episodes of non-bloody vomiting and also had some loose stools.  No fever or chills.  ED Course: Upon arrival to the ED, patient is found to be afebrile and saturating well on room air with elevated blood pressure.  EKG demonstrates prolonged QT interval.  No acute findings on chest x-ray.  Labs are most notable for sodium 123, potassium 2.9, AST 370, ALT 107, total bilirubin 4.4, WBC 12.9, ethanol undetectable, normal ammonia, and troponin normal x 2.  Patient was treated in the ED with 1 L of NS, 1 mg IV Ativan, Zofran, thiamine, and IV potassium.  Review of Systems:  All other systems reviewed and apart from HPI, are negative.  Past Medical History:  Diagnosis Date   Medical history non-contributory    Seizures (HCC)    pt reports having a seizure 05/2023    Past Surgical History:  Procedure Laterality Date   COLONOSCOPY WITH PROPOFOL N/A 09/26/2021   Procedure: COLONOSCOPY WITH PROPOFOL;  Surgeon:  Regis Bill, MD;  Location: [facility];  Service: Endoscopy;  Laterality: N/A;   COLONOSCOPY WITH PROPOFOL N/A 09/28/2021   Procedure: COLONOSCOPY WITH PROPOFOL;  Surgeon: Regis Bill, MD;  Location: [facility]COPY;  Service: Endoscopy;  Laterality: N/A;   ESOPHAGOGASTRODUODENOSCOPY (EGD) WITH PROPOFOL N/A 09/26/2021   Procedure: ESOPHAGOGASTRODUODENOSCOPY (EGD) WITH PROPOFOL;  Surgeon: Regis Bill, MD;  Location: [facility] END[facility]COPY;  Service: Endoscopy;  Laterality: N/A;   TONSILLECTOMY      Social History:   reports that he has been smoking cigars. He has never used smokeless tobacco. He reports current alcohol use of about 4.0 standard drinks of alcohol per week. He reports that he does not currently use drugs.  No Known Allergies  Family History  Family history unknown: Yes     Prior to Admission medications   Medication Sig Start Date End Date Taking? Authorizing Provider  acetaminophen (TYLENOL) 500 MG tablet Take 1,000 mg by mouth every 6 (six) hours as needed.   Yes [provider]  aspirin EC 81 MG tablet Take 81-162 mg by mouth daily as needed for mild pain (pain score 1-3) or moderate pain (pain score 4-6). Swallow whole.   Yes [provider]  Multiple Vitamin (MULTIVITAMIN WITH MINERALS) TABS tablet Take 1 tablet by mouth daily. 07/12/23  Yes Ntuen, Jesusita Oka, FNP  Multiple Vitamins-Minerals (MENS MULTIVITAMIN PO) Take 1 each by mouth daily.   Yes [provider]  naphazoline-pheniramine (NAPHCON-A) 0.025-0.3 % ophthalmic solution Place 1 drop into both eyes 4 (four) times daily as  needed for eye irritation. 07/11/23  Yes Ntuen, Jesusita Oka, FNP  thiamine (VITAMIN B-1) 100 MG tablet Take 1 tablet (100 mg total) by mouth daily. Patient taking differently: Take 100 mg by mouth daily as needed. 07/12/23  Yes Ntuen, Jesusita Oka, FNP  traZODone (DESYREL) 50 MG tablet Take 1 tablet (50 mg total) by mouth at bedtime as needed for sleep. 07/11/23  Yes  Cecilie Lowers, FNP    Physical Exam: Vitals:   08/26/23 2045 08/26/23 2100 08/26/23 2130 08/26/23 2207  BP: (!) 182/106 (!) 186/95 (!) 170/96   Pulse: 89 (!) 111 86   Resp: (!) 25 (!) 21 (!) 21   Temp:      TempSrc:      SpO2: 100% 100% 99%   Weight:    81.6 kg  Height:    6' (1.829 m)     Constitutional: NAD, no pallor or diaphoresis   Eyes: PERTLA, lids and conjunctivae normal ENMT: Mucous membranes are moist. Posterior pharynx clear of any exudate or lesions.   Neck: supple, no masses  Respiratory: no wheezing, no crackles. No accessory muscle use.  Cardiovascular: S1 & S2 heard, regular rate and rhythm. No extremity edema.   Abdomen: soft, tender in RUQ, no guarding. Bowel sounds active.  Musculoskeletal: no clubbing / cyanosis. No joint deformity upper and lower extremities.   Skin: no significant rashes, lesions, ulcers. Warm, dry, well-perfused. Neurologic: CN 2-12 grossly intact. Moving all extremities. Resting UE tremor. Lethargic.  Psychiatric: Calm, cooperative.    Labs and Imaging on Admission: I have personally reviewed following labs and imaging studies  CBC: Recent Labs  Lab 08/26/23 1900  WBC 12.9*  HGB 16.4  HCT 43.7  MCV 91.4  PLT 265   Basic Metabolic Panel: Recent Labs  Lab 08/26/23 1942  NA 123*  K 2.9*  CL 72*  CO2 23  GLUCOSE 156*  BUN 5*  CREATININE 0.99  CALCIUM 9.1   GFR: Estimated Creatinine Clearance: 99.1 mL/min (by C-G formula based on SCr of 0.99 mg/dL). Liver Function Tests: Recent Labs  Lab 08/26/23 1942  AST 370*  ALT 106*  ALKPHOS 87  BILITOT 4.4*  PROT 7.1  ALBUMIN 4.0   Recent Labs  Lab 08/26/23 1942  LIPASE 33   Recent Labs  Lab 08/26/23 1942  AMMONIA 18   Coagulation Profile: No results for input(s): "INR", "PROTIME" in the last 168 hours. Cardiac Enzymes: No results for input(s): "CKTOTAL", "CKMB", "CKMBINDEX", "TROPONINI" in the last 168 hours. BNP (last 3 results) No results for input(s):  "PROBNP" in the last 8760 hours. HbA1C: No results for input(s): "HGBA1C" in the last 72 hours. CBG: No results for input(s): "GLUCAP" in the last 168 hours. Lipid Profile: No results for input(s): "CHOL", "HDL", "LDLCALC", "TRIG", "CHOLHDL", "LDLDIRECT" in the last 72 hours. Thyroid Function Tests: No results for input(s): "TSH", "T4TOTAL", "FREET4", "T3FREE", "THYROIDAB" in the last 72 hours. Anemia Panel: No results for input(s): "VITAMINB12", "FOLATE", "FERRITIN", "TIBC", "IRON", "RETICCTPCT" in the last 72 hours. Urine analysis:    Component Value Date/Time   COLORURINE AMBER (A) 08/26/2023 2127   APPEARANCEUR CLEAR 08/26/2023 2127   LABSPEC 1.012 08/26/2023 2127   PHURINE 5.0 08/26/2023 2127   GLUCOSEU NEGATIVE 08/26/2023 2127   HGBUR SMALL (A) 08/26/2023 2127   BILIRUBINUR NEGATIVE 08/26/2023 2127   KETONESUR 20 (A) 08/26/2023 2127   PROTEINUR 30 (A) 08/26/2023 2127   NITRITE NEGATIVE 08/26/2023 2127   LEUKOCYTESUR NEGATIVE 08/26/2023 2127   Sepsis Labs: @  LABRCNTIP(procalcitonin:4,lacticidven:4) )No results found for this or any previous visit (from the past 240 hour(s)).   Radiological Exams on Admission: DG Chest Portable 1 View  Result Date: 08/26/2023 CLINICAL DATA:  Chest pain EXAM: PORTABLE CHEST 1 VIEW COMPARISON:  06/24/2022 FINDINGS: The heart size and mediastinal contours are within normal limits. Both lungs are clear. The visualized skeletal structures are unremarkable. IMPRESSION: No active disease. Electronically Signed   By: Charlett Nose M.D.   On: 08/26/2023 20:24    EKG: Independently reviewed. Sinus rhythm, QTc 569 ms.   Assessment/Plan   1. Alcohol withdrawal  - In active withdrawal while trying to cut back on alcohol  - Continue CIWA scoring, treat with Ativan, supplement vitamins    2. Elevated LFTs  - Alcoholic hepatitis suspected   - Check RUQ [place], check PT/INR and calculate Maddrey discriminant function score, continue supportive care for now     3. Hyponatremia  - Serum sodium 123 on admission in setting of hypovolemia   - Repeat sodium level now that he has received a liter of NS in ED, restrict free water intake, follow serial sodium levels   4. Hypokalemia  - Replacing  5. Prolonged QT interval  - QTc 569 in ED  - Correct hypokalemia, check magnesium, avoid QT-prolonging medications    DVT prophylaxis: Lovenox  Code Status: Full  Level of Care: Level of care: Progressive Family Communication: Wife at bedside   Disposition Plan:  Patient is from: Home  Anticipated d/c is to: TBD Anticipated d/c date is: 08/28/23  Patient currently: Pending treatment of alcohol withdrawal, improved or stable LFTs  Consults called: None  Admission status: Inpatient     Briscoe Deutscher, MD [facility]  08/26/2023, 10:26 PM

## 2023-08-26 NOTE — ED Triage Notes (Signed)
Pt to ED via [facility] from home. Pt's wife called EMS for slurred speech and facial droop. Upon EMS arrival, pt did not have any slurred speech or facial droop. Pt's LKW was at 1630 yesterday 08/25/2023. Pt c/o dizziness, generalized weakness, diffuse abdominal pain, and nausea. Pt also c/o epigastric pain radiating into chest. Pt also has hx of alcohol withdrawal seizures. Pt's last drink was this morning. Pt has tremors, headache, high BP, tachycardia, anxiety and tactile disturbances with EMS.    EMS: 18 LAC 170/100 130 CBG 99% RA 100 HR

## 2023-08-26 NOTE — ED Notes (Signed)
Patient to CT.

## 2023-08-27 ENCOUNTER — Inpatient Hospital Stay (HOSPITAL_COMMUNITY): Payer: 59

## 2023-08-27 DIAGNOSIS — F1090 Alcohol use, unspecified, uncomplicated: Secondary | ICD-10-CM | POA: Diagnosis not present

## 2023-08-27 DIAGNOSIS — K7010 Alcoholic hepatitis without ascites: Secondary | ICD-10-CM | POA: Diagnosis not present

## 2023-08-27 DIAGNOSIS — E871 Hypo-osmolality and hyponatremia: Secondary | ICD-10-CM | POA: Diagnosis not present

## 2023-08-27 DIAGNOSIS — F10932 Alcohol use, unspecified with withdrawal with perceptual disturbance: Secondary | ICD-10-CM | POA: Diagnosis not present

## 2023-08-27 LAB — BASIC METABOLIC PANEL
Anion gap: 14 (ref 5–15)
Anion gap: 11 (ref 5–15)
BUN: <5 mg/dL — ABNORMAL LOW (ref 6–20)
BUN: <5 mg/dL — ABNORMAL LOW (ref 6–20)
CO2: 29 mmol/L (ref 22–32)
CO2: 33 mmol/L — ABNORMAL HIGH (ref 22–32)
Calcium: 8.7 mg/dL — ABNORMAL LOW (ref 8.9–10.3)
Calcium: 9 mg/dL (ref 8.9–10.3)
Chloride: 84 mmol/L — ABNORMAL LOW (ref 98–111)
Chloride: 88 mmol/L — ABNORMAL LOW (ref 98–111)
Creatinine, Ser: 0.9 mg/dL (ref 0.61–1.24)
Creatinine, Ser: 0.95 mg/dL (ref 0.61–1.24)
GFR, Estimated: >60 mL/min (ref >60)
GFR, Estimated: >60 mL/min (ref >60)
Glucose, Bld: 103 mg/dL — ABNORMAL HIGH (ref 70–99)
Glucose, Bld: 97 mg/dL (ref 70–99)
Potassium: 4.1 mmol/L (ref 3.5–5.1)
Potassium: 3.8 mmol/L (ref 3.5–5.1)
Sodium: 127 mmol/L — ABNORMAL LOW (ref 135–145)
Sodium: 132 mmol/L — ABNORMAL LOW (ref 135–145)

## 2023-08-27 LAB — HEPATIC FUNCTION PANEL
ALT: 88 U/L — ABNORMAL HIGH (ref 0–44)
AST: 269 U/L — ABNORMAL HIGH (ref 15–41)
Albumin: 3.5 g/dL (ref 3.5–5.0)
Alkaline Phosphatase: 74 U/L (ref 38–126)
Bilirubin, Direct: 0.9 mg/dL — ABNORMAL HIGH (ref 0.0–0.2)
Indirect Bilirubin: 2.8 mg/dL — ABNORMAL HIGH (ref 0.3–0.9)
Total Bilirubin: 3.7 mg/dL — ABNORMAL HIGH (ref <1.2)
Total Protein: 6.2 g/dL — ABNORMAL LOW (ref 6.5–8.1)

## 2023-08-27 LAB — PHOSPHORUS: Phosphorus: 3.3 mg/dL (ref 2.5–4.6)

## 2023-08-27 LAB — CBC
HCT: 37.5 % — ABNORMAL LOW (ref 39.0–52.0)
Hemoglobin: 13.8 g/dL (ref 13.0–17.0)
MCH: 33.7 pg (ref 26.0–34.0)
MCHC: 36.8 g/dL — ABNORMAL HIGH (ref 30.0–36.0)
MCV: 91.7 fL (ref 80.0–100.0)
Platelets: 170 10*3/uL (ref 150–400)
RBC: 4.09 MIL/uL — ABNORMAL LOW (ref 4.22–5.81)
RDW: 12.3 % (ref 11.5–15.5)
WBC: 10.4 10*3/uL (ref 4.0–10.5)
nRBC: 0 % (ref 0.0–0.2)

## 2023-08-27 LAB — MAGNESIUM: Magnesium: 1.3 mg/dL — ABNORMAL LOW (ref 1.7–2.4)

## 2023-08-27 LAB — PROTIME-INR
INR: 0.9 (ref 0.8–1.2)
Prothrombin Time: 12.5 s (ref 11.4–15.2)

## 2023-08-27 LAB — HIV ANTIBODY (ROUTINE TESTING W REFLEX): HIV Screen 4th Generation wRfx: NONREACTIVE

## 2023-08-27 MED ORDER — IBUPROFEN 400 MG PO TABS
400.0000 mg | ORAL_TABLET | ORAL | Status: DC | PRN
  Administered 2023-08-27: 400 mg via ORAL
  Filled 2023-08-27 (×2): qty 1

## 2023-08-27 MED ORDER — OXYCODONE HCL 5 MG PO TABS
5.0000 mg | ORAL_TABLET | ORAL | Status: DC | PRN
  Administered 2023-08-28 (×2): 5 mg via ORAL
  Filled 2023-08-27 (×2): qty 1

## 2023-08-27 MED ORDER — PANTOPRAZOLE SODIUM 40 MG IV SOLR
40.0000 mg | Freq: Once | INTRAVENOUS | Status: AC
  Administered 2023-08-27: 40 mg via INTRAVENOUS
  Filled 2023-08-27: qty 10

## 2023-08-27 MED ORDER — MAGNESIUM SULFATE 2 GM/50ML IV SOLN
2.0000 g | Freq: Once | INTRAVENOUS | Status: AC
  Administered 2023-08-27: 2 g via INTRAVENOUS
  Filled 2023-08-27: qty 50

## 2023-08-27 MED ORDER — ALUM & MAG HYDROXIDE-SIMETH 200-200-20 MG/5ML PO SUSP
15.0000 mL | ORAL | Status: DC | PRN
  Administered 2023-08-27 – 2023-08-28 (×6): 15 mL via ORAL
  Filled 2023-08-27 (×6): qty 30

## 2023-08-27 NOTE — ED Notes (Signed)
ED TO INPATIENT HANDOFF REPORT  ED Nurse Name and Phone #: Jesse Sans [phone]  S Name/Age/Gender Manual Meier 47 y.o. male Room/Bed: 006C/006C  Code Status   Code Status: Full Code  Home/SNF/Other Home Patient oriented to: self, place, time, and situation Is this baseline? Yes   Triage Complete: Triage complete  Chief Complaint Alcohol withdrawal seizure (HCC) [A54.098, R56.9]  Triage Note Pt to ED via [facility] from home. Pt's wife called EMS for slurred speech and facial droop. Upon EMS arrival, pt did not have any slurred speech or facial droop. Pt's LKW was at 1630 yesterday 08/25/2023. Pt c/o dizziness, generalized weakness, diffuse abdominal pain, and nausea. Pt also c/o epigastric pain radiating into chest. Pt also has hx of alcohol withdrawal seizures. Pt's last drink was this morning. Pt has tremors, headache, high BP, tachycardia, anxiety and tactile disturbances with EMS.    EMS: 18 LAC 170/100 130 CBG 99% RA 100 HR   Allergies No Known Allergies  Level of Care/Admitting Diagnosis ED Disposition     ED Disposition  Admit   Condition  --   Comment  Hospital [facility] [[id number]]  Level of Care: Progressive [102]  Admit to Progressive based on following criteria: MULTISYSTEM THREATS such as stable sepsis, metabolic/electrolyte imbalance with or without encephalopathy that is responding to early treatment.  May admit patient to [facility] or [name] if equivalent level of care is available:: Yes  Covid Evaluation: Asymptomatic - no recent exposure (last 10 days) testing not required  Diagnosis: Alcohol withdrawal seizure [facility]) [[id number]]  Admitting Physician: Briscoe Deutscher [[id number]]  Attending Physician: Briscoe Deutscher [[id number]]  Certification:: I certify this patient will need inpatient services for at least 2 midnights  Expected Medical Readiness: 08/28/2023          B Medical/Surgery History Past Medical History:   Diagnosis Date   Medical history non-contributory    Seizures (HCC)    pt reports having a seizure 05/2023   Past Surgical History:  Procedure Laterality Date   COLONOSCOPY WITH PROPOFOL N/A 09/26/2021   Procedure: COLONOSCOPY WITH PROPOFOL;  Surgeon: Regis Bill, MD;  Location: [facility] ENDOSCOPY;  Service: Endoscopy;  Laterality: N/A;   COLONOSCOPY WITH PROPOFOL N/A 09/28/2021   Procedure: COLONOSCOPY WITH PROPOFOL;  Surgeon: Regis Bill, MD;  Location: [facility] ENDOSCOPY;  Service: Endoscopy;  Laterality: N/A;   ESOPHAGOGASTRODUODENOSCOPY (EGD) WITH PROPOFOL N/A 09/26/2021   Procedure: ESOPHAGOGASTRODUODENOSCOPY (EGD) WITH PROPOFOL;  Surgeon: Regis Bill, MD;  Location: [facility] ENDOSCOPY;  Service: Endoscopy;  Laterality: N/A;   TONSILLECTOMY       A IV Location/Drains/Wounds Patient Lines/Drains/Airways Status     Active Line/Drains/Airways     Name Placement date Placement time Site Days   Peripheral IV 08/26/23 18 G Left Antecubital 08/26/23  1846  Antecubital  1            Intake/Output Last 24 hours  Intake/Output Summary (Last 24 hours) at 08/27/2023 1328 Last data filed at 08/26/2023 2059 Gross per 24 hour  Intake 1000 ml  Output --  Net 1000 ml    Labs/Imaging Results for orders placed or performed during the hospital encounter of 08/26/23 (from the past 48 hour(s))  Ethanol     Status: None   Collection Time: 08/26/23  7:00 PM  Result Value Ref Range   Alcohol, Ethyl (B) <10 <10 mg/dL    Comment: (NOTE) Lowest detectable limit for serum alcohol is 10 mg/dL.  For  medical purposes only. Performed at [facility], [address], [place], [place] [address]   cbc     Status: Abnormal   Collection Time: 08/26/23  7:00 PM  Result Value Ref Range   WBC 12.9 (H) 4.0 - 10.5 K/uL    Comment: WHITE COUNT CONFIRMED ON SMEAR   RBC 4.78 4.22 - 5.81 MIL/uL   Hemoglobin 16.4 13.0 - 17.0 g/dL   HCT 62.1 30.8 - 65.7 %   MCV 91.4 80.0 - 100.0 fL    MCH 34.3 (H) 26.0 - 34.0 pg   MCHC 37.5 (H) 30.0 - 36.0 g/dL   RDW 84.6 96.2 - 95.2 %   Platelets 265 150 - 400 K/uL   nRBC 0.0 0.0 - 0.2 %    Comment: Performed at [facility], [address], [place], [place] [address]  Troponin I (High Sensitivity)     Status: None   Collection Time: 08/26/23  7:00 PM  Result Value Ref Range   Troponin I (High Sensitivity) 4 <18 ng/L    Comment: (NOTE) Elevated high sensitivity troponin I (hsTnI) values and significant  changes across serial measurements may suggest ACS but many other  chronic and acute conditions are known to elevate hsTnI results.  Refer to the "Links" section for chest pain algorithms and additional  guidance. Performed at [facility], [address], [place], [place] [address]   Comprehensive metabolic panel     Status: Abnormal   Collection Time: 08/26/23  7:42 PM  Result Value Ref Range   Sodium 123 (L) 135 - 145 mmol/L   Potassium 2.9 (L) 3.5 - 5.1 mmol/L   Chloride 72 (L) 98 - 111 mmol/L   CO2 23 22 - 32 mmol/L   Glucose, Bld 156 (H) 70 - 99 mg/dL    Comment: Glucose reference range applies only to samples taken after fasting for at least 8 hours.   BUN 5 (L) 6 - 20 mg/dL    Comment: REPEATED TO VERIFY   Creatinine, Ser 0.99 0.61 - 1.24 mg/dL   Calcium 9.1 8.9 - 27.2 mg/dL   Total Protein 7.1 6.5 - 8.1 g/dL   Albumin 4.0 3.5 - 5.0 g/dL   AST 536 (H) 15 - 41 U/L   ALT 106 (H) 0 - 44 U/L   Alkaline Phosphatase 87 38 - 126 U/L   Total Bilirubin 4.4 (H) <1.2 mg/dL   GFR, Estimated >64 >40 mL/min    Comment: (NOTE) Calculated using the CKD-EPI Creatinine Equation (2021)    Anion gap 28 (H) 5 - 15    Comment: ELECTROLYTES REPEATED TO VERIFY Performed at [facility], [address] [place], [place] [address]   Lipase, blood     Status: None   Collection Time: 08/26/23  7:42 PM  Result Value Ref Range   Lipase 33 11 - 51 U/L    Comment: Performed at [facility], [address] [place], [place] [address]  Ammonia     Status: None   Collection Time: 08/26/23  7:42 PM  Result Value Ref Range   Ammonia 18 9 - 35 umol/L    Comment: Performed at [facility], [address] [place], [place] [address]  Troponin I (High Sensitivity)     Status: None   Collection Time: 08/26/23  9:01 PM  Result Value Ref Range   Troponin I (High Sensitivity) 4 <18 ng/L    Comment: (NOTE)  Elevated high sensitivity troponin I (hsTnI) values and significant  changes across serial measurements may suggest ACS but many other  chronic and acute conditions are known to elevate hsTnI results.  Refer to the "Links" section for chest pain algorithms and additional  guidance. Performed at [facility], [address], [place], [place] [address]   Rapid urine drug screen (hospital performed)     Status: None   Collection Time: 08/26/23  9:27 PM  Result Value Ref Range   Opiates NONE DETECTED NONE DETECTED   Cocaine NONE DETECTED NONE DETECTED   Benzodiazepines NONE DETECTED NONE DETECTED   Amphetamines NONE DETECTED NONE DETECTED   Tetrahydrocannabinol NONE DETECTED NONE DETECTED   Barbiturates NONE DETECTED NONE DETECTED    Comment: (NOTE) DRUG SCREEN FOR MEDICAL PURPOSES ONLY.  IF CONFIRMATION IS NEEDED FOR ANY PURPOSE, NOTIFY LAB WITHIN 5 DAYS.  LOWEST DETECTABLE LIMITS FOR URINE DRUG SCREEN Drug Class                     Cutoff (ng/mL) Amphetamine and metabolites    1000 Barbiturate and metabolites    200 Benzodiazepine                 200 Opiates and metabolites        300 Cocaine and metabolites        300 THC                            50 Performed at [facility] Lab, [address], [place], [place] [address]   Urinalysis, Routine w reflex microscopic -Urine, Clean Catch     Status: Abnormal   Collection Time: 08/26/23  9:27 PM  Result Value Ref Range   Color, Urine AMBER (A) YELLOW    Comment: BIOCHEMICALS MAY BE AFFECTED BY COLOR   APPearance CLEAR  CLEAR   Specific Gravity, Urine 1.012 1.005 - 1.030   pH 5.0 5.0 - 8.0   Glucose, UA NEGATIVE NEGATIVE mg/dL   Hgb urine dipstick SMALL (A) NEGATIVE   Bilirubin Urine NEGATIVE NEGATIVE   Ketones, ur 20 (A) NEGATIVE mg/dL   Protein, ur 30 (A) NEGATIVE mg/dL   Nitrite NEGATIVE NEGATIVE   Leukocytes,Ua NEGATIVE NEGATIVE   RBC / HPF 0-5 0 - 5 RBC/hpf   WBC, UA 0-5 0 - 5 WBC/hpf   Bacteria, UA NONE SEEN NONE SEEN   Squamous Epithelial / HPF 0-5 0 - 5 /HPF   Mucus PRESENT    Hyaline Casts, UA PRESENT     Comment: Performed at [facility], [address] [place], [place] [address]  Magnesium     Status: Abnormal   Collection Time: 08/27/23  2:21 AM  Result Value Ref Range   Magnesium 1.3 (L) 1.7 - 2.4 mg/dL    Comment: Performed at [facility], [address], [place], [place] [address]  Phosphorus     Status: None   Collection Time: 08/27/23  2:21 AM  Result Value Ref Range   Phosphorus 3.3 2.5 - 4.6 mg/dL    Comment: Performed at [facility], [address], [place], [place] [address]  HIV Antibody (routine testing w rflx)     Status: None   Collection Time: 08/27/23  2:21 AM  Result Value Ref Range   HIV Screen 4th Generation wRfx Non Reactive Non Reactive    Comment: Performed at [facility]  Lab, [address] [place], [place] [address]  Hepatic function panel     Status: Abnormal   Collection Time: 08/27/23  2:21 AM  Result Value Ref Range   Total Protein 6.2 (L) 6.5 - 8.1 g/dL   Albumin 3.5 3.5 - 5.0 g/dL   AST 098 (H) 15 - 41 U/L   ALT 88 (H) 0 - 44 U/L   Alkaline Phosphatase 74 38 - 126 U/L   Total Bilirubin 3.7 (H) <1.2 mg/dL   Bilirubin, Direct 0.9 (H) 0.0 - 0.2 mg/dL   Indirect Bilirubin 2.8 (H) 0.3 - 0.9 mg/dL    Comment: Performed at [facility], [address], [place], [place] [address]  Basic metabolic panel     Status: Abnormal   Collection Time: 08/27/23  2:21 AM  Result Value Ref Range   Sodium 127 (L) 135 - 145 mmol/L    Potassium 4.1 3.5 - 5.1 mmol/L   Chloride 84 (L) 98 - 111 mmol/L   CO2 29 22 - 32 mmol/L   Glucose, Bld 103 (H) 70 - 99 mg/dL    Comment: Glucose reference range applies only to samples taken after fasting for at least 8 hours.   BUN <5 (L) 6 - 20 mg/dL   Creatinine, Ser 7.82 0.61 - 1.24 mg/dL   Calcium 8.7 (L) 8.9 - 10.3 mg/dL   GFR, Estimated >95 >62 mL/min    Comment: (NOTE) Calculated using the CKD-EPI Creatinine Equation (2021)    Anion gap 14 5 - 15    Comment: Performed at [facility], [address] [place], [place] [address]  CBC     Status: Abnormal   Collection Time: 08/27/23  2:21 AM  Result Value Ref Range   WBC 10.4 4.0 - 10.5 K/uL   RBC 4.09 (L) 4.22 - 5.81 MIL/uL   Hemoglobin 13.8 13.0 - 17.0 g/dL   HCT 57.8 (L) 46.9 - 62.9 %   MCV 91.7 80.0 - 100.0 fL   MCH 33.7 26.0 - 34.0 pg   MCHC 36.8 (H) 30.0 - 36.0 g/dL   RDW 52.8 41.3 - 24.4 %   Platelets 170 150 - 400 K/uL   nRBC 0.0 0.0 - 0.2 %    Comment: Performed at [facility], [address], [place], [place] [address]  Protime-INR     Status: None   Collection Time: 08/27/23  2:21 AM  Result Value Ref Range   Prothrombin Time 12.5 11.4 - 15.2 seconds   INR 0.9 0.8 - 1.2    Comment: (NOTE) INR goal varies based on device and disease states. Performed at [facility], [address], [place], [place] [address]   Basic metabolic panel     Status: Abnormal   Collection Time: 08/27/23  5:50 AM  Result Value Ref Range   Sodium 132 (L) 135 - 145 mmol/L    Comment: DELTA CHECK NOTED REPEATED TO VERIFY    Potassium 3.8 3.5 - 5.1 mmol/L   Chloride 88 (L) 98 - 111 mmol/L   CO2 33 (H) 22 - 32 mmol/L   Glucose, Bld 97 70 - 99 mg/dL    Comment: Glucose reference range applies only to samples taken after fasting for at least 8 hours.   BUN <5 (L) 6 - 20 mg/dL   Creatinine, Ser 4.40 0.61 - 1.24 mg/dL   Calcium 9.0 8.9 - 34.7 mg/dL   GFR, Estimated >42 >59 mL/min    Comment: (  NOTE) Calculated  using the CKD-EPI Creatinine Equation (2021)    Anion gap 11 5 - 15    Comment: Performed at [facility], [address], [place], [place] [address]   US Abdomen Limited RUQ (LIVER/GB)  Result Date: 08/27/2023 CLINICAL DATA:  Elevated liver function tests, unspecified abdominal pain EXAM: ULTRASOUND ABDOMEN LIMITED RIGHT UPPER QUADRANT COMPARISON:  03/25/2021 FINDINGS: Gallbladder: Nodular sludge is seen dependently within the gallbladder. The gallbladder, however, is not distended, there is no gallbladder wall thickening, and no pericholecystic fluid is identified. The sonographic Eulah Pont sign is reportedly negative. Common bile duct: Diameter: 2 mm in proximal diameter Liver: Hepatic parenchymal echogenicity is diffusely increased in keeping with mild hepatic steatosis. No focal intrahepatic masses are seen and there is no intrahepatic biliary ductal dilation. Portal vein is patent on color Doppler imaging with normal direction of blood flow towards the liver. Other: None. IMPRESSION: 1. Gallbladder sludge. No sonographic evidence of acute cholecystitis. 2. Mild hepatic steatosis. Electronically Signed   By: Helyn Numbers M.D.   On: 08/27/2023 01:38   CT HEAD WO CONTRAST ( )  Result Date: 08/26/2023 CLINICAL DATA:  Weakness, dizziness, ETOH intoxication EXAM: CT HEAD WITHOUT CONTRAST TECHNIQUE: Contiguous axial images were obtained from the base of the skull through the vertex without intravenous contrast. RADIATION DOSE REDUCTION: This exam was performed according to the departmental dose-optimization program which includes automated exposure control, adjustment of the mA and/or kV according to patient size and/or use of iterative reconstruction technique. COMPARISON:  06/05/2023 FINDINGS: Motion degraded images. Brain: No evidence of acute infarction, hemorrhage, hydrocephalus, extra-axial collection or mass lesion/mass effect. Mild subcortical white matter and periventricular small vessel  ischemic changes. Vascular: No hyperdense vessel or unexpected calcification. Skull: Normal. Negative for fracture or focal lesion. Sinuses/Orbits: The visualized paranasal sinuses are essentially clear. The mastoid air cells are unopacified. Other: None. IMPRESSION: Motion degraded images. No acute intracranial abnormality. Mild small vessel ischemic changes. Electronically Signed   By: Charline Bills M.D.   On: 08/26/2023 22:57   DG Chest Portable 1 View  Result Date: 08/26/2023 CLINICAL DATA:  Chest pain EXAM: PORTABLE CHEST 1 VIEW COMPARISON:  06/24/2022 FINDINGS: The heart size and mediastinal contours are within normal limits. Both lungs are clear. The visualized skeletal structures are unremarkable. IMPRESSION: No active disease. Electronically Signed   By: Charlett Nose M.D.   On: 08/26/2023 20:24    Pending Labs Unresulted Labs (From admission, onward)     Start     Ordered   09/02/23 0500  Creatinine, serum  (enoxaparin (LOVENOX)    CrCl >/= 30 ml/min)  Weekly,   R     Comments: while on enoxaparin therapy    08/26/23 2226   08/27/23 1100  Basic metabolic panel  Once,   R        08/27/23 1100   08/27/23 0500  Hepatic function panel  Daily,   R      08/26/23 2226   08/27/23 0500  CBC  Daily,   R      08/26/23 2226   08/27/23 0500  Protime-INR  Daily,   R      08/26/23 2232   08/26/23 2226  Basic metabolic panel  Now then every 6 hours,   R      08/26/23 2226            Vitals/Pain Today's Vitals   08/27/23 0952 08/27/23 1000 08/27/23 1200 08/27/23 1300  BP: (!) 138/100 (!) 150/95 Marland Kitchen)  163/90 118/80  Pulse: (!) 106 93 98 95  Resp:  15 18 15   Temp:   98.2 F (36.8 C)   TempSrc:   Oral   SpO2:  99% 97% 96%  Weight:      Height:      PainSc:        Isolation Precautions No active isolations  Medications Medications  LORazepam (ATIVAN) tablet 1-4 mg (1 mg Oral Given 08/27/23 0959)    Or  LORazepam (ATIVAN) injection 1-4 mg ( Intravenous See Alternative 08/27/23  0959)  thiamine (VITAMIN B1) tablet 100 mg (100 mg Oral Given 08/27/23 0951)    Or  thiamine (VITAMIN B1) injection 100 mg ( Intravenous See Alternative 08/27/23 0951)  folic acid (FOLVITE) tablet 1 mg (1 mg Oral Given 08/27/23 0951)  multivitamin with minerals tablet 1 tablet (1 tablet Oral Given 08/27/23 0951)  LORazepam (ATIVAN) injection 0-4 mg ( Intravenous Not Given 08/27/23 0959)    Followed by  LORazepam (ATIVAN) injection 0-4 mg (has no administration in time range)  enoxaparin (LOVENOX) injection 40 mg (40 mg Subcutaneous Given 08/26/23 2316)  sodium chloride flush (NS) 0.9 % injection 3 mL (3 mLs Intravenous Not Given 08/26/23 2335)  oxyCODONE (Oxy IR/ROXICODONE) immediate release tablet 5 mg (5 mg Oral Given 08/27/23 0556)  polyethylene glycol (MIRALAX / GLYCOLAX) packet 17 g (has no administration in time range)  alum & mag hydroxide-simeth (MAALOX/MYLANTA) 200-200-20 MG/5ML suspension 15 mL (15 mLs Oral Given 08/27/23 0802)  sodium chloride 0.9 % bolus 1,000 mL (0 mLs Intravenous Stopped 08/26/23 2059)  ondansetron (ZOFRAN) injection 4 mg (4 mg Intravenous Given 08/26/23 1949)  LORazepam (ATIVAN) injection 1 mg (1 mg Intravenous Given 08/26/23 2009)  potassium chloride 10 mEq in 100 mL IVPB (0 mEq Intravenous Stopped 08/27/23 0130)  thiamine (VITAMIN B1) injection 100 mg (100 mg Intravenous Given 08/26/23 2204)  potassium chloride (KLOR-CON) packet 40 mEq (40 mEq Oral Given 08/26/23 2315)  pantoprazole (PROTONIX) injection 40 mg (40 mg Intravenous Given 08/27/23 0220)    Mobility walks with person assist     Focused Assessments Neuro Assessment Handoff:  Swallow screen pass? Yes  Cardiac Rhythm: Normal sinus rhythm       Neuro Assessment:   Neuro Checks:      Has TPA been given? No If patient is a Neuro Trauma and patient is going to OR before floor call report to 4N Charge nurse: [phone] or [phone]   R Recommendations: See Admitting Provider Note  Report given  to:   Additional Notes: pt is AAOx4. Pt is on room air

## 2023-08-27 NOTE — Plan of Care (Signed)
  Problem: Education: Goal: Knowledge of General Education information will improve Description: Including pain rating scale, medication(s)/side effects and non-pharmacologic comfort measures Outcome: Progressing   Problem: Health Behavior/Discharge Planning: Goal: Ability to manage health-related needs will improve Outcome: Progressing   Problem: Clinical Measurements: Goal: Ability to maintain clinical measurements within normal limits will improve Outcome: Progressing Goal: Will remain free from infection Outcome: Progressing Goal: Diagnostic test results will improve Outcome: Progressing Goal: Respiratory complications will improve Outcome: Progressing Goal: Cardiovascular complication will be avoided Outcome: Progressing   Problem: Activity: Goal: Risk for activity intolerance will decrease Outcome: Progressing   Problem: Nutrition: Goal: Adequate nutrition will be maintained Outcome: Progressing   Problem: Coping: Goal: Level of anxiety will decrease Outcome: Progressing   Problem: Elimination: Goal: Will not experience complications related to bowel motility Outcome: Progressing Goal: Will not experience complications related to urinary retention Outcome: Progressing   Problem: Pain Management: Goal: General experience of comfort will improve Outcome: Progressing   Problem: Safety: Goal: Ability to remain free from injury will improve Outcome: Progressing   Problem: Skin Integrity: Goal: Risk for impaired skin integrity will decrease Outcome: Progressing   Problem: Education: Goal: Knowledge of disease or condition will improve Outcome: Progressing Goal: Understanding of discharge needs will improve Outcome: Progressing   Problem: Health Behavior/Discharge Planning: Goal: Ability to identify changes in lifestyle to reduce recurrence of condition will improve Outcome: Progressing Goal: Identification of resources available to assist in meeting health  care needs will improve Outcome: Progressing   Problem: Physical Regulation: Goal: Complications related to the disease process, condition or treatment will be avoided or minimized Outcome: Progressing   Problem: Safety: Goal: Ability to remain free from injury will improve Outcome: Progressing

## 2023-08-27 NOTE — Progress Notes (Signed)
PROGRESS NOTE    Nicholas Fletcher  BTD:[id number] DOB: [birth year] DOA: 08/26/2023 PCP: Pcp, No   Brief Narrative:  Nicholas Fletcher is a 47 y.o. male with medical history significant for anxiety disorder and alcohol abuse who complains of epigastric abdominal pain and nausea - he has been trying to cut back/down on alcohol use over the past week. Drinks a 6 pack of beer and pint of liquor daily (at minimum) - likely more on weekend.   ED - patient was tremulous, hypertensive, and tachycardic, noted to have tactile disturbances and hallucinations, and complained of upper abdominal pain and nausea. Hospitalist called for admission given likely alcohol withdrawals.  Assessment & Plan:   Principal Problem:   Alcohol withdrawal (HCC) Active Problems:   Alcohol use disorder   Alcoholic hepatitis   Hyponatremia   Hypokalemia  Acute alcohol withdrawal Concurrent hallucinations SIRS without signs of infection  -Secondary to attempted decrease alcohol intake on his own -Continue CIWA scoring, treat with Ativan, supplement vitamins    Elevated LFTs  -Secondary to alcohol abuse and suspected alcoholic hepatitis (US shows hepatic steatosis) -RUQ - gallbladder sludge without cholecystitis -INR wnl - Maddrey discriminant function score minimal   Hyponatremia, hypovolemic -Improving -Increase PO intake - hold fluids   Hypokalemia  - Replaced - follow repeat labs   Prolonged QT interval  -QTc 569 in ED  -Avoid QT prolonging meds; electrolytes repleted - follow along  DVT prophylaxis: enoxaparin (LOVENOX) injection 40 mg Start: 08/26/23 2230   Code Status:   Code Status: Full Code  Family Communication: None  Status is: Inpt  Dispo: The patient is from: Home              Anticipated d/c is to: Home              Anticipated d/c date is: 48-72h              Patient currently NOT medically stable for discharge  Consultants:  None  Procedures:  None  Antimicrobials:  None    Subjective: No acute issue/events overnight  Objective: Vitals:   08/27/23 0330 08/27/23 0345 08/27/23 0401 08/27/23 0647  BP: 133/87 (!) 142/77 (!) 142/77 (!) 152/99  Pulse: 89 90 90 96  Resp: 16 17  19   Temp:      TempSrc:      SpO2: 97% 97%  94%  Weight:      Height:        Intake/Output Summary (Last 24 hours) at 08/27/2023 1062 Last data filed at 08/26/2023 2059 Gross per 24 hour  Intake 1000 ml  Output --  Net 1000 ml   Filed Weights   08/26/23 2207  Weight: 81.6 kg    Examination:  General:  Pleasantly resting in bed, No acute distress. HEENT:  Normocephalic atraumatic.  Sclerae nonicteric, noninjected.  Extraocular movements intact bilaterally. Neck:  Without mass or deformity.  Trachea is midline. Lungs:  Clear to auscultate bilaterally without rhonchi, wheeze, or rales. Heart:  Regular rate and rhythm.  Without murmurs, rubs, or gallops. Abdomen:  Soft, nontender, nondistended.  Without guarding or rebound. Extremities: Without cyanosis, clubbing, edema, or obvious deformity. Skin:  Warm and dry, no erythema.  Data Reviewed: I have personally reviewed following labs and imaging studies  CBC: Recent Labs  Lab 08/26/23 1900 08/27/23 0221  WBC 12.9* 10.4  HGB 16.4 13.8  HCT 43.7 37.5*  MCV 91.4 91.7  PLT 265 170   Basic Metabolic Panel: Recent Labs  Lab 08/26/23 1942 08/27/23 0221  NA 123* 127*  K 2.9* 4.1  CL 72* 84*  CO2 23 29  GLUCOSE 156* 103*  BUN 5* <5*  CREATININE 0.99 0.90  CALCIUM 9.1 8.7*  MG  --  1.3*  PHOS  --  3.3   GFR: Estimated Creatinine Clearance: 109 mL/min (by C-G formula based on SCr of 0.9 mg/dL). Liver Function Tests: Recent Labs  Lab 08/26/23 1942 08/27/23 0221  AST 370* 269*  ALT 106* 88*  ALKPHOS 87 74  BILITOT 4.4* 3.7*  PROT 7.1 6.2*  ALBUMIN 4.0 3.5   Recent Labs  Lab 08/26/23 1942  LIPASE 33   Recent Labs  Lab 08/26/23 1942  AMMONIA 18   Coagulation Profile: Recent Labs  Lab  08/27/23 0221  INR 0.9   No results found for this or any previous visit (from the past 240 hour(s)).   Radiology Studies: US Abdomen Limited RUQ (LIVER/GB)  Result Date: 08/27/2023 CLINICAL DATA:  Elevated liver function tests, unspecified abdominal pain EXAM: ULTRASOUND ABDOMEN LIMITED RIGHT UPPER QUADRANT COMPARISON:  03/25/2021 FINDINGS: Gallbladder: Nodular sludge is seen dependently within the gallbladder. The gallbladder, however, is not distended, there is no gallbladder wall thickening, and no pericholecystic fluid is identified. The sonographic Eulah Pont sign is reportedly negative. Common bile duct: Diameter: 2 mm in proximal diameter Liver: Hepatic parenchymal echogenicity is diffusely increased in keeping with mild hepatic steatosis. No focal intrahepatic masses are seen and there is no intrahepatic biliary ductal dilation. Portal vein is patent on color Doppler imaging with normal direction of blood flow towards the liver. Other: None. IMPRESSION: 1. Gallbladder sludge. No sonographic evidence of acute cholecystitis. 2. Mild hepatic steatosis. Electronically Signed   By: Helyn Numbers M.D.   On: 08/27/2023 01:38   CT HEAD WO CONTRAST ( )  Result Date: 08/26/2023 CLINICAL DATA:  Weakness, dizziness, ETOH intoxication EXAM: CT HEAD WITHOUT CONTRAST TECHNIQUE: Contiguous axial images were obtained from the base of the skull through the vertex without intravenous contrast. RADIATION DOSE REDUCTION: This exam was performed according to the departmental dose-optimization program which includes automated exposure control, adjustment of the mA and/or kV according to patient size and/or use of iterative reconstruction technique. COMPARISON:  06/05/2023 FINDINGS: Motion degraded images. Brain: No evidence of acute infarction, hemorrhage, hydrocephalus, extra-axial collection or mass lesion/mass effect. Mild subcortical white matter and periventricular small vessel ischemic changes. Vascular: No  hyperdense vessel or unexpected calcification. Skull: Normal. Negative for fracture or focal lesion. Sinuses/Orbits: The visualized paranasal sinuses are essentially clear. The mastoid air cells are unopacified. Other: None. IMPRESSION: Motion degraded images. No acute intracranial abnormality. Mild small vessel ischemic changes. Electronically Signed   By: Charline Bills M.D.   On: 08/26/2023 22:57   DG Chest Portable 1 View  Result Date: 08/26/2023 CLINICAL DATA:  Chest pain EXAM: PORTABLE CHEST 1 VIEW COMPARISON:  06/24/2022 FINDINGS: The heart size and mediastinal contours are within normal limits. Both lungs are clear. The visualized skeletal structures are unremarkable. IMPRESSION: No active disease. Electronically Signed   By: Charlett Nose M.D.   On: 08/26/2023 20:24    Scheduled Meds:  enoxaparin (LOVENOX) injection  40 mg Subcutaneous Q24H   folic acid  1 mg Oral Daily   LORazepam  0-4 mg Intravenous Q4H   Followed by   Melene Muller ON 08/28/2023] LORazepam  0-4 mg Intravenous Q8H   multivitamin with minerals  1 tablet Oral Daily   sodium chloride flush  3 mL Intravenous Q12H  thiamine  100 mg Oral Daily   Or   thiamine  100 mg Intravenous Daily   Continuous Infusions:   LOS: 1 day   Time spent:  Azucena Fallen, DO [facility]  If 7PM-7AM, please contact night-coverage [email or web address]  08/27/2023, 6:52 AM

## 2023-08-28 DIAGNOSIS — F10932 Alcohol use, unspecified with withdrawal with perceptual disturbance: Secondary | ICD-10-CM | POA: Diagnosis not present

## 2023-08-28 DIAGNOSIS — K7010 Alcoholic hepatitis without ascites: Secondary | ICD-10-CM | POA: Diagnosis not present

## 2023-08-28 DIAGNOSIS — F1090 Alcohol use, unspecified, uncomplicated: Secondary | ICD-10-CM | POA: Diagnosis not present

## 2023-08-28 DIAGNOSIS — E876 Hypokalemia: Secondary | ICD-10-CM | POA: Diagnosis not present

## 2023-08-28 LAB — CBC
HCT: 40.2 % (ref 39.0–52.0)
Hemoglobin: 13.8 g/dL (ref 13.0–17.0)
MCH: 33.2 pg (ref 26.0–34.0)
MCHC: 34.3 g/dL (ref 30.0–36.0)
MCV: 96.6 fL (ref 80.0–100.0)
Platelets: 123 10*3/uL — ABNORMAL LOW (ref 150–400)
RBC: 4.16 MIL/uL — ABNORMAL LOW (ref 4.22–5.81)
RDW: 12.7 % (ref 11.5–15.5)
WBC: 7 10*3/uL (ref 4.0–10.5)
nRBC: 0 % (ref 0.0–0.2)

## 2023-08-28 LAB — BASIC METABOLIC PANEL
Anion gap: 9 (ref 5–15)
BUN: 5 mg/dL — ABNORMAL LOW (ref 6–20)
CO2: 31 mmol/L (ref 22–32)
Calcium: 9.2 mg/dL (ref 8.9–10.3)
Chloride: 94 mmol/L — ABNORMAL LOW (ref 98–111)
Creatinine, Ser: 1.02 mg/dL (ref 0.61–1.24)
GFR, Estimated: >60 mL/min (ref >60)
Glucose, Bld: 94 mg/dL (ref 70–99)
Potassium: 3.3 mmol/L — ABNORMAL LOW (ref 3.5–5.1)
Sodium: 134 mmol/L — ABNORMAL LOW (ref 135–145)

## 2023-08-28 LAB — HEPATIC FUNCTION PANEL
ALT: 68 U/L — ABNORMAL HIGH (ref 0–44)
AST: 149 U/L — ABNORMAL HIGH (ref 15–41)
Albumin: 3.2 g/dL — ABNORMAL LOW (ref 3.5–5.0)
Alkaline Phosphatase: 71 U/L (ref 38–126)
Bilirubin, Direct: 0.4 mg/dL — ABNORMAL HIGH (ref 0.0–0.2)
Indirect Bilirubin: 1.7 mg/dL — ABNORMAL HIGH (ref 0.3–0.9)
Total Bilirubin: 2.1 mg/dL — ABNORMAL HIGH (ref <1.2)
Total Protein: 5.9 g/dL — ABNORMAL LOW (ref 6.5–8.1)

## 2023-08-28 LAB — PROTIME-INR
INR: 0.8 (ref 0.8–1.2)
Prothrombin Time: 11.7 s (ref 11.4–15.2)

## 2023-08-28 MED ORDER — SUCRALFATE 1 GM/10ML PO SUSP
1.0000 g | Freq: Three times a day (TID) | ORAL | Status: DC
  Administered 2023-08-29 (×2): 1 g via ORAL
  Filled 2023-08-28 (×2): qty 10

## 2023-08-28 MED ORDER — LIDOCAINE VISCOUS HCL 2 % MT SOLN
15.0000 mL | OROMUCOSAL | Status: DC | PRN
Start: 2023-08-28 — End: 2023-08-29
  Filled 2023-08-28: qty 15

## 2023-08-28 MED ORDER — POTASSIUM CHLORIDE CRYS ER 20 MEQ PO TBCR
40.0000 meq | EXTENDED_RELEASE_TABLET | Freq: Once | ORAL | Status: AC
  Administered 2023-08-28: 40 meq via ORAL
  Filled 2023-08-28: qty 2

## 2023-08-28 MED ORDER — METOCLOPRAMIDE HCL 5 MG/ML IJ SOLN
10.0000 mg | Freq: Three times a day (TID) | INTRAMUSCULAR | Status: DC | PRN
  Administered 2023-08-28 (×2): 10 mg via INTRAVENOUS
  Filled 2023-08-28 (×2): qty 2

## 2023-08-28 MED ORDER — PANTOPRAZOLE SODIUM 40 MG PO TBEC
40.0000 mg | DELAYED_RELEASE_TABLET | Freq: Every day | ORAL | Status: DC
  Administered 2023-08-28: 40 mg via ORAL
  Filled 2023-08-28: qty 1

## 2023-08-28 NOTE — TOC CM/SW Note (Signed)
Transition of Care [facility]) - Inpatient Brief Assessment   Patient Details  Name: Nicholas Fletcher MRN: [id number] Date of Birth: 05-Feb-1976  [facility]) CM/SW Contact:    Mearl Latin, LCSW Phone Number: 08/28/2023, 9:33 AM   Clinical Narrative: Patient admitted from home with spouse. No PCP listed. CSW following for ETOH resources.    Transition of Care Asessment: Insurance and Status: Insurance coverage has been reviewed Patient has primary care physician: No Home environment has been reviewed: From home Prior level of function:: Independent Prior/Current Home Services: No current home services Social Determinants of Health Reivew: SDOH reviewed no interventions necessary Readmission risk has been reviewed: Yes Transition of care needs: transition of care needs identified, TOC will continue to follow

## 2023-08-28 NOTE — TOC Progression Note (Signed)
Transition of Care [facility]) - Progression Note    Patient Details  Name: Nicholas Fletcher MRN: [id number] Date of Birth: Feb 14, 1976  Transition of Care [facility]) CM/SW Contact  Mearl Latin, LCSW Phone Number: 08/28/2023, 4:16 PM  Clinical Narrative:    CSW attempted to see patient but he was asleep. Will return at a later time.         Expected Discharge Plan and Services                                               Social Determinants of Health (SDOH) Interventions SDOH Screenings   Food Insecurity: No Food Insecurity (07/06/2023)  Housing: Low Risk  (07/06/2023)  Transportation Needs: No Transportation Needs (07/06/2023)  Utilities: Not At Risk (07/06/2023)  Alcohol Screen: Medium Risk (07/05/2023)  Social Connections: Unknown (03/06/2022)   Received from [facility], [facility]  Tobacco Use: High Risk (08/26/2023)    Readmission Risk Interventions     No data to display

## 2023-08-28 NOTE — Progress Notes (Signed)
PROGRESS NOTE        PATIENT DETAILS Name: Nicholas Fletcher Age: 47 y.o. Sex: male Date of Birth: 12/23/1975 Admit Date: 08/26/2023 Admitting Physician Briscoe Deutscher, MD PCP:Pcp, No  Brief Summary: Patient is a 47 y.o.  male with history of anxiety disorder/EtOH use-who presented with epigastric pain/nausea-has been trying to cut back on his alcohol use-found to be tachycardic/hypertensive/hallucinating in the ED-and subsequently admitted to the hospitalist service.   Significant events: 11/4>> admit to [facility]  Significant studies: 11/4>> CXR: No PNA 11/4>> CT head: No acute intracranial abnormality 11/4>> RUQ ultrasound: Gallbladder sludge-no acute cholecystitis.  Significant microbiology data: None  Procedures: None  Consults: None  Subjective: Lying comfortably in bed-denies any chest pain or shortness of breath.  Feels much better today.  No tremors.  Awake/alert.  Claims last drink was this past Saturday.  Objective: Vitals: Blood pressure (!) 121/92, pulse (!) 101, temperature 97.6 F (36.4 C), temperature source Oral, resp. rate 18, height 6' (1.829 m), weight 81.6 kg, SpO2 95%.   Exam: Gen Exam:Alert awake-not in any distress HEENT:atraumatic, normocephalic Chest: B/L clear to auscultation anteriorly CVS:S1S2 regular Abdomen:soft non tender, non distended Extremities:no edema Neurology: Non focal Skin: no rash  Pertinent Labs/Radiology:    Latest Ref Rng & Units 08/28/2023    4:10 AM 08/27/2023    2:21 AM 08/26/2023    7:00 PM  CBC  WBC 4.0 - 10.5 K/uL 7.0  10.4  12.9   Hemoglobin 13.0 - 17.0 g/dL 13.0  86.5  78.4   Hematocrit 39.0 - 52.0 % 40.2  37.5  43.7   Platelets 150 - 400 K/uL 123  170  265     Lab Results  Component Value Date   NA 134 (L) 08/28/2023   K 3.3 (L) 08/28/2023   CL 94 (L) 08/28/2023   CO2 31 08/28/2023      Assessment/Plan: EtOH withdrawal with mild DTs Improving Last drink 11/2 Continue CIWA  protocol with Ativan  Alcohol hepatitis Transaminases decreasing with supportive care Follow LFTs  Hypokalemia Replete/recheck  Prolonged QTc Telemetry monitoring Keep K> 4, Mg> 2 Recheck twelve-lead EKG in a.m. and K/Mg with a.m. labs  BMI: Estimated body mass index is 24.41 kg/m as calculated from the following:   Height as of this encounter: 6' (1.829 m).   Weight as of this encounter: 81.6 kg.   Code status:   Code Status: Full Code   DVT Prophylaxis: enoxaparin (LOVENOX) injection 40 mg Start: 08/26/23 2230   Family Communication: None at bedside   Disposition Plan: Status is: Inpatient Remains inpatient appropriate because: Severity of illness   Planned Discharge Destination:Home   Diet: Diet Order             Diet regular Room service appropriate? Yes; Fluid consistency: Thin; Fluid restriction: 1500 mL Fluid  Diet effective now                     Antimicrobial agents: Anti-infectives (From admission, onward)    None        MEDICATIONS: Scheduled Meds:  enoxaparin (LOVENOX) injection  40 mg Subcutaneous Q24H   folic acid  1 mg Oral Daily   LORazepam  0-4 mg Intravenous Q4H   Followed by   LORazepam  0-4 mg Intravenous Q8H   multivitamin with minerals  1 tablet Oral  Daily   sodium chloride flush  3 mL Intravenous Q12H   thiamine  100 mg Oral Daily   Continuous Infusions: PRN Meds:.alum & mag hydroxide-simeth, ibuprofen, LORazepam **OR** LORazepam, oxyCODONE, polyethylene glycol   I have personally reviewed following labs and imaging studies  LABORATORY DATA: CBC: Recent Labs  Lab 08/26/23 1900 08/27/23 0221 08/28/23 0410  WBC 12.9* 10.4 7.0  HGB 16.4 13.8 13.8  HCT 43.7 37.5* 40.2  MCV 91.4 91.7 96.6  PLT 265 170 123*    Basic Metabolic Panel: Recent Labs  Lab 08/26/23 1942 08/27/23 0221 08/27/23 0550 08/28/23 0410  NA 123* 127* 132* 134*  K 2.9* 4.1 3.8 3.3*  CL 72* 84* 88* 94*  CO2 23 29 33* 31  GLUCOSE 156*  103* 97 94  BUN 5* <5* <5* 5*  CREATININE 0.99 0.90 0.95 1.02  CALCIUM 9.1 8.7* 9.0 9.2  MG  --  1.3*  --   --   PHOS  --  3.3  --   --     GFR: Estimated Creatinine Clearance: 96.2 mL/min (by C-G formula based on SCr of 1.02 mg/dL).  Liver Function Tests: Recent Labs  Lab 08/26/23 1942 08/27/23 0221 08/28/23 0410  AST 370* 269* 149*  ALT 106* 88* 68*  ALKPHOS 87 74 71  BILITOT 4.4* 3.7* 2.1*  PROT 7.1 6.2* 5.9*  ALBUMIN 4.0 3.5 3.2*   Recent Labs  Lab 08/26/23 1942  LIPASE 33   Recent Labs  Lab 08/26/23 1942  AMMONIA 18    Coagulation Profile: Recent Labs  Lab 08/27/23 0221 08/28/23 0410  INR 0.9 0.8    Cardiac Enzymes: No results for input(s): "CKTOTAL", "CKMB", "CKMBINDEX", "TROPONINI" in the last 168 hours.  BNP (last 3 results) No results for input(s): "PROBNP" in the last 8760 hours.  Lipid Profile: No results for input(s): "CHOL", "HDL", "LDLCALC", "TRIG", "CHOLHDL", "LDLDIRECT" in the last 72 hours.  Thyroid Function Tests: No results for input(s): "TSH", "T4TOTAL", "FREET4", "T3FREE", "THYROIDAB" in the last 72 hours.  Anemia Panel: No results for input(s): "VITAMINB12", "FOLATE", "FERRITIN", "TIBC", "IRON", "RETICCTPCT" in the last 72 hours.  Urine analysis:    Component Value Date/Time   COLORURINE AMBER (A) 08/26/2023 2127   APPEARANCEUR CLEAR 08/26/2023 2127   LABSPEC 1.012 08/26/2023 2127   PHURINE 5.0 08/26/2023 2127   GLUCOSEU NEGATIVE 08/26/2023 2127   HGBUR SMALL (A) 08/26/2023 2127   BILIRUBINUR NEGATIVE 08/26/2023 2127   KETONESUR 20 (A) 08/26/2023 2127   PROTEINUR 30 (A) 08/26/2023 2127   NITRITE NEGATIVE 08/26/2023 2127   LEUKOCYTESUR NEGATIVE 08/26/2023 2127    Sepsis Labs: Lactic Acid, Venous    Component Value Date/Time   LATICACIDVEN 1.2 09/27/2021 0227    MICROBIOLOGY: No results found for this or any previous visit (from the past 240 hour(s)).  RADIOLOGY STUDIES/RESULTS: US Abdomen Limited RUQ  (LIVER/GB)  Result Date: 08/27/2023 CLINICAL DATA:  Elevated liver function tests, unspecified abdominal pain EXAM: ULTRASOUND ABDOMEN LIMITED RIGHT UPPER QUADRANT COMPARISON:  03/25/2021 FINDINGS: Gallbladder: Nodular sludge is seen dependently within the gallbladder. The gallbladder, however, is not distended, there is no gallbladder wall thickening, and no pericholecystic fluid is identified. The sonographic Eulah Pont sign is reportedly negative. Common bile duct: Diameter: 2 mm in proximal diameter Liver: Hepatic parenchymal echogenicity is diffusely increased in keeping with mild hepatic steatosis. No focal intrahepatic masses are seen and there is no intrahepatic biliary ductal dilation. Portal vein is patent on color Doppler imaging with normal direction of blood flow towards  the liver. Other: None. IMPRESSION: 1. Gallbladder sludge. No sonographic evidence of acute cholecystitis. 2. Mild hepatic steatosis. Electronically Signed   By: Helyn Numbers M.D.   On: 08/27/2023 01:38   CT HEAD WO CONTRAST ( )  Result Date: 08/26/2023 CLINICAL DATA:  Weakness, dizziness, ETOH intoxication EXAM: CT HEAD WITHOUT CONTRAST TECHNIQUE: Contiguous axial images were obtained from the base of the skull through the vertex without intravenous contrast. RADIATION DOSE REDUCTION: This exam was performed according to the departmental dose-optimization program which includes automated exposure control, adjustment of the mA and/or kV according to patient size and/or use of iterative reconstruction technique. COMPARISON:  06/05/2023 FINDINGS: Motion degraded images. Brain: No evidence of acute infarction, hemorrhage, hydrocephalus, extra-axial collection or mass lesion/mass effect. Mild subcortical white matter and periventricular small vessel ischemic changes. Vascular: No hyperdense vessel or unexpected calcification. Skull: Normal. Negative for fracture or focal lesion. Sinuses/Orbits: The visualized paranasal sinuses are  essentially clear. The mastoid air cells are unopacified. Other: None. IMPRESSION: Motion degraded images. No acute intracranial abnormality. Mild small vessel ischemic changes. Electronically Signed   By: Charline Bills M.D.   On: 08/26/2023 22:57   DG Chest Portable 1 View  Result Date: 08/26/2023 CLINICAL DATA:  Chest pain EXAM: PORTABLE CHEST 1 VIEW COMPARISON:  06/24/2022 FINDINGS: The heart size and mediastinal contours are within normal limits. Both lungs are clear. The visualized skeletal structures are unremarkable. IMPRESSION: No active disease. Electronically Signed   By: Charlett Nose M.D.   On: 08/26/2023 20:24     LOS: 2 days   Jeoffrey Massed, MD  [facility]    To contact the attending provider between 7A-7P or the covering provider during after hours 7P-7A, please log into the web site [email or web address] and access using universal [place] password for that web site. If you do not have the password, please call the hospital operator.  08/28/2023, 11:24 AM

## 2023-08-28 NOTE — Plan of Care (Signed)
  Problem: Health Behavior/Discharge Planning: Goal: Ability to manage health-related needs will improve Outcome: Progressing   Problem: Clinical Measurements: Goal: Ability to maintain clinical measurements within normal limits will improve Outcome: Progressing   Problem: Nutrition: Goal: Adequate nutrition will be maintained Outcome: Progressing   Problem: Pain Management: Goal: General experience of comfort will improve Outcome: Progressing

## 2023-08-28 NOTE — Plan of Care (Signed)
  Problem: Education: Goal: Knowledge of General Education information will improve Description: Including pain rating scale, medication(s)/side effects and non-pharmacologic comfort measures Outcome: Progressing   Problem: Health Behavior/Discharge Planning: Goal: Ability to manage health-related needs will improve Outcome: Progressing   Problem: Clinical Measurements: Goal: Ability to maintain clinical measurements within normal limits will improve Outcome: Progressing Goal: Will remain free from infection Outcome: Progressing Goal: Diagnostic test results will improve Outcome: Progressing Goal: Respiratory complications will improve Outcome: Progressing Goal: Cardiovascular complication will be avoided Outcome: Progressing   Problem: Activity: Goal: Risk for activity intolerance will decrease Outcome: Progressing   Problem: Nutrition: Goal: Adequate nutrition will be maintained Outcome: Progressing   Problem: Coping: Goal: Level of anxiety will decrease Outcome: Progressing   Problem: Elimination: Goal: Will not experience complications related to bowel motility Outcome: Progressing Goal: Will not experience complications related to urinary retention Outcome: Progressing   Problem: Pain Management: Goal: General experience of comfort will improve Outcome: Progressing   Problem: Safety: Goal: Ability to remain free from injury will improve Outcome: Progressing   Problem: Skin Integrity: Goal: Risk for impaired skin integrity will decrease Outcome: Progressing   Problem: Education: Goal: Knowledge of disease or condition will improve Outcome: Progressing Goal: Understanding of discharge needs will improve Outcome: Progressing   Problem: Health Behavior/Discharge Planning: Goal: Ability to identify changes in lifestyle to reduce recurrence of condition will improve Outcome: Progressing Goal: Identification of resources available to assist in meeting health  care needs will improve Outcome: Progressing   Problem: Physical Regulation: Goal: Complications related to the disease process, condition or treatment will be avoided or minimized Outcome: Progressing   Problem: Safety: Goal: Ability to remain free from injury will improve Outcome: Progressing

## 2023-08-29 ENCOUNTER — Other Ambulatory Visit (HOSPITAL_COMMUNITY): Payer: Self-pay

## 2023-08-29 DIAGNOSIS — E876 Hypokalemia: Secondary | ICD-10-CM | POA: Diagnosis not present

## 2023-08-29 DIAGNOSIS — K7010 Alcoholic hepatitis without ascites: Secondary | ICD-10-CM | POA: Diagnosis not present

## 2023-08-29 DIAGNOSIS — F10932 Alcohol use, unspecified with withdrawal with perceptual disturbance: Secondary | ICD-10-CM | POA: Diagnosis not present

## 2023-08-29 DIAGNOSIS — E871 Hypo-osmolality and hyponatremia: Secondary | ICD-10-CM | POA: Diagnosis not present

## 2023-08-29 LAB — BASIC METABOLIC PANEL
Anion gap: 9 (ref 5–15)
BUN: 5 mg/dL — ABNORMAL LOW (ref 6–20)
CO2: 29 mmol/L (ref 22–32)
Calcium: 9.6 mg/dL (ref 8.9–10.3)
Chloride: 95 mmol/L — ABNORMAL LOW (ref 98–111)
Creatinine, Ser: 0.98 mg/dL (ref 0.61–1.24)
GFR, Estimated: >60 mL/min (ref >60)
Glucose, Bld: 99 mg/dL (ref 70–99)
Potassium: 4.1 mmol/L (ref 3.5–5.1)
Sodium: 133 mmol/L — ABNORMAL LOW (ref 135–145)

## 2023-08-29 LAB — CBC
HCT: 41.3 % (ref 39.0–52.0)
Hemoglobin: 14 g/dL (ref 13.0–17.0)
MCH: 33.4 pg (ref 26.0–34.0)
MCHC: 33.9 g/dL (ref 30.0–36.0)
MCV: 98.6 fL (ref 80.0–100.0)
Platelets: 182 10*3/uL (ref 150–400)
RBC: 4.19 MIL/uL — ABNORMAL LOW (ref 4.22–5.81)
RDW: 12.8 % (ref 11.5–15.5)
WBC: 5 10*3/uL (ref 4.0–10.5)
nRBC: 0 % (ref 0.0–0.2)

## 2023-08-29 LAB — HEPATIC FUNCTION PANEL
ALT: 94 U/L — ABNORMAL HIGH (ref 0–44)
AST: 238 U/L — ABNORMAL HIGH (ref 15–41)
Albumin: 3.5 g/dL (ref 3.5–5.0)
Alkaline Phosphatase: 70 U/L (ref 38–126)
Bilirubin, Direct: 0.3 mg/dL — ABNORMAL HIGH (ref 0.0–0.2)
Indirect Bilirubin: 1.1 mg/dL — ABNORMAL HIGH (ref 0.3–0.9)
Total Bilirubin: 1.4 mg/dL — ABNORMAL HIGH (ref <1.2)
Total Protein: 6.9 g/dL (ref 6.5–8.1)

## 2023-08-29 LAB — PHOSPHORUS: Phosphorus: 2.6 mg/dL (ref 2.5–4.6)

## 2023-08-29 LAB — MAGNESIUM: Magnesium: 1.9 mg/dL (ref 1.7–2.4)

## 2023-08-29 MED ORDER — PANTOPRAZOLE SODIUM 40 MG PO TBEC
40.0000 mg | DELAYED_RELEASE_TABLET | Freq: Two times a day (BID) | ORAL | 1 refills | Status: DC
  Filled 2023-08-29: qty 60, 30d supply, fill #0

## 2023-08-29 MED ORDER — TRAZODONE HCL 50 MG PO TABS
50.0000 mg | ORAL_TABLET | Freq: Every evening | ORAL | 0 refills | Status: DC | PRN
  Filled 2023-08-29: qty 30, 30d supply, fill #0

## 2023-08-29 MED ORDER — PANTOPRAZOLE SODIUM 40 MG PO TBEC
40.0000 mg | DELAYED_RELEASE_TABLET | Freq: Two times a day (BID) | ORAL | Status: DC
  Administered 2023-08-29: 40 mg via ORAL
  Filled 2023-08-29: qty 1

## 2023-08-29 MED ORDER — ALUM & MAG HYDROXIDE-SIMETH 200-200-20 MG/5ML PO SUSP
15.0000 mL | ORAL | 0 refills | Status: DC | PRN
  Filled 2023-08-29: qty 355, 4d supply, fill #0

## 2023-08-29 NOTE — TOC Transition Note (Signed)
Transition of Care [facility]) - CM/SW Discharge Note   Patient Details  Name: Nicholas Fletcher MRN: [id number] Date of Birth: Aug 15, 1976  Transition of Care [facility]) CM/SW Contact:  Lawerance Sabal, RN Phone Number: 08/29/2023, 9:47 AM   Clinical Narrative:     Plan to DC to home today.  Requested CMA to schedule with a [facility] to establish a PCP.  Patient can also refer to QR code on AVS to establish with a primary care provider.  ETOH resources offered by CSW.  No other TOC needs identified for DC.   Final next level of care: Home/Self Care Barriers to Discharge: No Barriers Identified   Patient Goals and CMS Choice      Discharge Placement                         Discharge Plan and Services Additional resources added to the After Visit Summary for                  DME Arranged: N/A         HH Arranged: NA          Social Determinants of Health (SDOH) Interventions SDOH Screenings   Food Insecurity: No Food Insecurity (07/06/2023)  Housing: Low Risk  (07/06/2023)  Transportation Needs: No Transportation Needs (07/06/2023)  Utilities: Not At Risk (07/06/2023)  Alcohol Screen: Medium Risk (07/05/2023)  Social Connections: Unknown (03/06/2022)   Received from [facility], [facility]  Tobacco Use: High Risk (08/26/2023)     Readmission Risk Interventions     No data to display

## 2023-08-29 NOTE — Progress Notes (Signed)
Mobility Specialist Progress Note;   08/29/23 0945  Mobility  Activity Ambulated independently in hallway  Level of Assistance Independent  Assistive Device None  Distance Ambulated (ft) 250 ft  Activity Response Tolerated well  Mobility Referral Yes  $Mobility charge 1 Mobility  Mobility Specialist Start Time (ACUTE ONLY) 0945  Mobility Specialist Stop Time (ACUTE ONLY) 0955  Mobility Specialist Time Calculation (min) (ACUTE ONLY) 10 min   Pt agreeable to mobility. Required no physical assistance during ambulation. No c/o pain when asked. Asx throughout. Pt back in room with all needs met. Eager for d/c.   Caesar Bookman Mobility Specialist Please contact via SecureChat or Rehab Office [phone]

## 2023-08-29 NOTE — Discharge Summary (Signed)
PATIENT DETAILS Name: Nicholas Fletcher Age: 47 y.o. Sex: male Date of Birth: [birth year] MRN: [id number]. Admitting Physician: Briscoe Deutscher, MD PCP:Pcp, No  Admit Date: 08/26/2023 Discharge date: 08/29/2023  Recommendations for Outpatient Follow-up:  Follow up with PCP in 1-2 weeks Please obtain CMP/CBC in one week PCP to consider GI referral if reflux symptoms do not get better with medical therapy.  Admitted From:  Home  Disposition: Home   Discharge Condition: good  CODE STATUS:   Code Status: Full Code   Diet recommendation:  Diet Order             Diet general           Diet regular Room service appropriate? Yes; Fluid consistency: Thin; Fluid restriction: 1500 mL Fluid  Diet effective now                    Brief Summary: Patient is a 47 y.o.  male with history of anxiety disorder/EtOH use-who presented with epigastric pain/nausea-has been trying to cut back on his alcohol use-found to be tachycardic/hypertensive/hallucinating in the ED-and subsequently admitted to the hospitalist service.    Significant events: 11/4>> admit to [facility]   Significant studies: 11/4>> CXR: No PNA 11/4>> CT head: No acute intracranial abnormality 11/4>> RUQ ultrasound: Gallbladder sludge-no acute cholecystitis.   Significant microbiology data: None   Procedures: None   Consults: None  Brief Hospital Course: EtOH withdrawal with mild DTs Much proved with CIWA protocol/Ativan-completely awake and alert-no further tremors. Last drink 11/2 Stable for discharge today- extensive counseling provided-regarding importance of abstinence.    Alcohol hepatitis Overall improvement in transaminases-AST level still fluctuating Repeat LFTs in 1 week at PCPs office.    Hypokalemia/hypomagnesemia Repleted   Prolonged QTc Likely due to electrolyte abnormalities-once electrolytes replaced-QTc has normalized as of twelve-lead EKG on 11/7.  GERD/hiccups Continues to have  occasional hiccups-GERD symptoms better Continue PPI on discharge Use as needed Maalox/Mylanta If symptoms persist-have recommended patient ask PCP for GI referral. Benign  abdominal exam   BMI: Estimated body mass index is 24.41 kg/m as calculated from the following:   Height as of this encounter: 6' (1.829 m).   Weight as of this encounter: 81.6 kg.   Discharge Diagnoses:  Principal Problem:   Alcohol withdrawal (HCC) Active Problems:   Alcohol use disorder   Alcoholic hepatitis   Hyponatremia   Hypokalemia   Discharge Instructions:  Activity:  As tolerated  Discharge Instructions     Call MD for:  persistant nausea and vomiting   Complete by: As directed    Diet general   Complete by: As directed    Discharge instructions   Complete by: As directed    Follow with Primary MD in 1-2 weeks  Stop further alcohol use  If you continue to have epigastric discomfort/reflux symptoms/hiccups in spite of being on medications-please ask your primary care practitioner to refer to a gastroenterologist.  Please get a complete blood count and chemistry panel checked by your Primary MD at your next visit, and again as instructed by your Primary MD.  Get Medicines reviewed and adjusted: Please take all your medications with you for your next visit with your Primary MD  Laboratory/radiological data: Please request your Primary MD to go over all hospital tests and procedure/radiological results at the follow up, please ask your Primary MD to get all Hospital records sent to his/her office.  In some cases, they will be blood work, cultures and biopsy  results pending at the time of your discharge. Please request that your primary care M.D. follows up on these results.  Also Note the following: If you experience worsening of your admission symptoms, develop shortness of breath, life threatening emergency, suicidal or homicidal thoughts you must seek medical attention immediately by  calling 911 or calling your MD immediately  if symptoms less severe.  You must read complete instructions/literature along with all the possible adverse reactions/side effects for all the Medicines you take and that have been prescribed to you. Take any new Medicines after you have completely understood and accpet all the possible adverse reactions/side effects.   Do not drive when taking Pain medications or sleeping medications (Benzodaizepines)  Do not take more than prescribed Pain, Sleep and Anxiety Medications. It is not advisable to combine anxiety,sleep and pain medications without talking with your primary care practitioner  Special Instructions: If you have smoked or chewed Tobacco  in the last 2 yrs please stop smoking, stop any regular Alcohol  and or any Recreational drug use.  Wear Seat belts while driving.  Please note: You were cared for by a hospitalist during your hospital stay. Once you are discharged, your primary care physician will handle any further medical issues. Please note that NO REFILLS for any discharge medications will be authorized once you are discharged, as it is imperative that you return to your primary care physician (or establish a relationship with a primary care physician if you do not have one) for your post hospital discharge needs so that they can reassess your need for medications and monitor your lab values.   Increase activity slowly   Complete by: As directed       Allergies as of 08/29/2023   No Known Allergies      Medication List     TAKE these medications    acetaminophen 500 MG tablet Commonly known as: TYLENOL Take 1,000 mg by mouth every 6 (six) hours as needed.   alum & mag hydroxide-simeth 200-200-20 MG/5ML suspension Commonly known as: MAALOX/MYLANTA Take 15 mLs by mouth every 4 (four) hours as needed for indigestion or heartburn.   aspirin EC 81 MG tablet Take 81-162 mg by mouth daily as needed for mild pain (pain score  1-3) or moderate pain (pain score 4-6). Swallow whole.   MENS MULTIVITAMIN PO Take 1 each by mouth daily.   multivitamin with minerals Tabs tablet Take 1 tablet by mouth daily.   naphazoline-pheniramine 0.025-0.3 % ophthalmic solution Commonly known as: NAPHCON-A Place 1 drop into both eyes 4 (four) times daily as needed for eye irritation.   pantoprazole 40 MG tablet Commonly known as: PROTONIX Take 1 tablet (40 mg total) by mouth 2 (two) times daily.   thiamine 100 MG tablet Commonly known as: Vitamin B-1 Take 1 tablet (100 mg total) by mouth daily. What changed:  when to take this reasons to take this   traZODone 50 MG tablet Commonly known as: DESYREL Take 1 tablet (50 mg total) by mouth at bedtime as needed for sleep.        Follow-up Information     Primary care practitioner. Schedule an appointment as soon as possible for a visit in 1 week(s).                 No Known Allergies   Other Procedures/Studies: US Abdomen Limited RUQ (LIVER/GB)  Result Date: 08/27/2023 CLINICAL DATA:  Elevated liver function tests, unspecified abdominal pain EXAM: ULTRASOUND ABDOMEN LIMITED RIGHT  UPPER QUADRANT COMPARISON:  03/25/2021 FINDINGS: Gallbladder: Nodular sludge is seen dependently within the gallbladder. The gallbladder, however, is not distended, there is no gallbladder wall thickening, and no pericholecystic fluid is identified. The sonographic Eulah Pont sign is reportedly negative. Common bile duct: Diameter: 2 mm in proximal diameter Liver: Hepatic parenchymal echogenicity is diffusely increased in keeping with mild hepatic steatosis. No focal intrahepatic masses are seen and there is no intrahepatic biliary ductal dilation. Portal vein is patent on color Doppler imaging with normal direction of blood flow towards the liver. Other: None. IMPRESSION: 1. Gallbladder sludge. No sonographic evidence of acute cholecystitis. 2. Mild hepatic steatosis. Electronically Signed   By:  Helyn Numbers M.D.   On: 08/27/2023 01:38   CT HEAD WO CONTRAST ( )  Result Date: 08/26/2023 CLINICAL DATA:  Weakness, dizziness, ETOH intoxication EXAM: CT HEAD WITHOUT CONTRAST TECHNIQUE: Contiguous axial images were obtained from the base of the skull through the vertex without intravenous contrast. RADIATION DOSE REDUCTION: This exam was performed according to the departmental dose-optimization program which includes automated exposure control, adjustment of the mA and/or kV according to patient size and/or use of iterative reconstruction technique. COMPARISON:  06/05/2023 FINDINGS: Motion degraded images. Brain: No evidence of acute infarction, hemorrhage, hydrocephalus, extra-axial collection or mass lesion/mass effect. Mild subcortical white matter and periventricular small vessel ischemic changes. Vascular: No hyperdense vessel or unexpected calcification. Skull: Normal. Negative for fracture or focal lesion. Sinuses/Orbits: The visualized paranasal sinuses are essentially clear. The mastoid air cells are unopacified. Other: None. IMPRESSION: Motion degraded images. No acute intracranial abnormality. Mild small vessel ischemic changes. Electronically Signed   By: Charline Bills M.D.   On: 08/26/2023 22:57   DG Chest Portable 1 View  Result Date: 08/26/2023 CLINICAL DATA:  Chest pain EXAM: PORTABLE CHEST 1 VIEW COMPARISON:  06/24/2022 FINDINGS: The heart size and mediastinal contours are within normal limits. Both lungs are clear. The visualized skeletal structures are unremarkable. IMPRESSION: No active disease. Electronically Signed   By: Charlett Nose M.D.   On: 08/26/2023 20:24     TODAY-DAY OF DISCHARGE:  Subjective:   Nicholas Fletcher today has no headache,no chest abdominal pain,no new weakness tingling or numbness, feels much better wants to go home today.   Objective:   Blood pressure (!) 151/103, pulse 83, temperature 99.3 F (37.4 C), temperature source Oral, resp. rate 16,  height 6' (1.829 m), weight 76.5 kg, SpO2 95%.  Intake/Output Summary (Last 24 hours) at 08/29/2023 0910 Last data filed at 08/29/2023 0800 Gross per 24 hour  Intake 240 ml  Output 1600 ml  Net -1360 ml   Filed Weights   08/26/23 2207 08/29/23 0500  Weight: 81.6 kg 76.5 kg    Exam: Awake Alert, Oriented *3, No new F.N deficits, Normal affect [place].AT,PERRAL Supple Neck,No JVD, No cervical lymphadenopathy appriciated.  Symmetrical Chest wall movement, Good air movement bilaterally, CTAB RRR,No Gallops,Rubs or new Murmurs, No Parasternal Heave +ve B.Sounds, Abd Soft, Non tender, No organomegaly appriciated, No rebound -guarding or rigidity. No Cyanosis, Clubbing or edema, No new Rash or bruise   PERTINENT RADIOLOGIC STUDIES: No results found.   PERTINENT LAB RESULTS: CBC: Recent Labs    08/28/23 0410 08/29/23 0604  WBC 7.0 5.0  HGB 13.8 14.0  HCT 40.2 41.3  PLT 123* 182   CMET CMP     Component Value Date/Time   NA 133 (L) 08/29/2023 0604   K 4.1 08/29/2023 0604   CL 95 (L) 08/29/2023 0604   CO2  29 08/29/2023 0604   GLUCOSE 99 08/29/2023 0604   BUN 5 (L) 08/29/2023 0604   CREATININE 0.98 08/29/2023 0604   CALCIUM 9.6 08/29/2023 0604   PROT 6.9 08/29/2023 0604   ALBUMIN 3.5 08/29/2023 0604   AST 238 (H) 08/29/2023 0604   ALT 94 (H) 08/29/2023 0604   ALKPHOS 70 08/29/2023 0604   BILITOT 1.4 (H) 08/29/2023 0604   GFRNONAA >60 08/29/2023 0604    GFR Estimated Creatinine Clearance: 98.7 mL/min (by C-G formula based on SCr of 0.98 mg/dL). Recent Labs    08/26/23 1942  LIPASE 33   No results for input(s): "CKTOTAL", "CKMB", "CKMBINDEX", "TROPONINI" in the last 72 hours. Invalid input(s): "POCBNP" No results for input(s): "DDIMER" in the last 72 hours. No results for input(s): "HGBA1C" in the last 72 hours. No results for input(s): "CHOL", "HDL", "LDLCALC", "TRIG", "CHOLHDL", "LDLDIRECT" in the last 72 hours. No results for input(s): "TSH", "T4TOTAL",  "T3FREE", "THYROIDAB" in the last 72 hours.  Invalid input(s): "FREET3" No results for input(s): "VITAMINB12", "FOLATE", "FERRITIN", "TIBC", "IRON", "RETICCTPCT" in the last 72 hours. Coags: Recent Labs    08/27/23 0221 08/28/23 0410  INR 0.9 0.8   Microbiology: No results found for this or any previous visit (from the past 240 hour(s)).  FURTHER DISCHARGE INSTRUCTIONS:  Get Medicines reviewed and adjusted: Please take all your medications with you for your next visit with your Primary MD  Laboratory/radiological data: Please request your Primary MD to go over all hospital tests and procedure/radiological results at the follow up, please ask your Primary MD to get all Hospital records sent to his/her office.  In some cases, they will be blood work, cultures and biopsy results pending at the time of your discharge. Please request that your primary care M.D. goes through all the records of your hospital data and follows up on these results.  Also Note the following: If you experience worsening of your admission symptoms, develop shortness of breath, life threatening emergency, suicidal or homicidal thoughts you must seek medical attention immediately by calling 911 or calling your MD immediately  if symptoms less severe.  You must read complete instructions/literature along with all the possible adverse reactions/side effects for all the Medicines you take and that have been prescribed to you. Take any new Medicines after you have completely understood and accpet all the possible adverse reactions/side effects.   Do not drive when taking Pain medications or sleeping medications (Benzodaizepines)  Do not take more than prescribed Pain, Sleep and Anxiety Medications. It is not advisable to combine anxiety,sleep and pain medications without talking with your primary care practitioner  Special Instructions: If you have smoked or chewed Tobacco  in the last 2 yrs please stop smoking, stop  any regular Alcohol  and or any Recreational drug use.  Wear Seat belts while driving.  Please note: You were cared for by a hospitalist during your hospital stay. Once you are discharged, your primary care physician will handle any further medical issues. Please note that NO REFILLS for any discharge medications will be authorized once you are discharged, as it is imperative that you return to your primary care physician (or establish a relationship with a primary care physician if you do not have one) for your post hospital discharge needs so that they can reassess your need for medications and monitor your lab values.  Total Time spent coordinating discharge including counseling, education and face to face time equals greater than 30 minutes.  Signed: Jeoffrey Massed  08/29/2023 9:10 AM

## 2023-08-29 NOTE — Plan of Care (Signed)

## 2023-08-31 ENCOUNTER — Other Ambulatory Visit (HOSPITAL_COMMUNITY): Payer: Self-pay

## 2023-09-02 DIAGNOSIS — K30 Functional dyspepsia: Secondary | ICD-10-CM | POA: Diagnosis not present

## 2023-09-02 DIAGNOSIS — Z09 Encounter for follow-up examination after completed treatment for conditions other than malignant neoplasm: Secondary | ICD-10-CM | POA: Diagnosis not present

## 2023-09-02 DIAGNOSIS — F1090 Alcohol use, unspecified, uncomplicated: Secondary | ICD-10-CM | POA: Diagnosis not present

## 2023-09-02 DIAGNOSIS — R7989 Other specified abnormal findings of blood chemistry: Secondary | ICD-10-CM | POA: Diagnosis not present

## 2023-09-10 ENCOUNTER — Ambulatory Visit (HOSPITAL_COMMUNITY): Payer: No Typology Code available for payment source | Admitting: Licensed Clinical Social Worker

## 2023-09-12 DIAGNOSIS — G40209 Localization-related (focal) (partial) symptomatic epilepsy and epileptic syndromes with complex partial seizures, not intractable, without status epilepticus: Secondary | ICD-10-CM | POA: Insufficient documentation

## 2023-09-16 ENCOUNTER — Ambulatory Visit: Payer: 59 | Admitting: Internal Medicine

## 2023-09-18 ENCOUNTER — Encounter (HOSPITAL_COMMUNITY): Payer: Self-pay | Admitting: Psychiatry

## 2023-09-18 ENCOUNTER — Ambulatory Visit (HOSPITAL_BASED_OUTPATIENT_CLINIC_OR_DEPARTMENT_OTHER): Payer: 59 | Admitting: Psychiatry

## 2023-09-18 ENCOUNTER — Telehealth (HOSPITAL_COMMUNITY): Payer: Self-pay

## 2023-09-18 VITALS — Wt 168.0 lb

## 2023-09-18 DIAGNOSIS — F1090 Alcohol use, unspecified, uncomplicated: Secondary | ICD-10-CM | POA: Diagnosis not present

## 2023-09-18 DIAGNOSIS — F419 Anxiety disorder, unspecified: Secondary | ICD-10-CM

## 2023-09-18 DIAGNOSIS — R454 Irritability and anger: Secondary | ICD-10-CM | POA: Diagnosis not present

## 2023-09-18 MED ORDER — NALTREXONE HCL 50 MG PO TABS: 50.0000 mg | ORAL_TABLET | Freq: Every day | ORAL | 1 refills | Status: DC

## 2023-09-18 MED ORDER — LAMOTRIGINE 25 MG PO TABS: ORAL_TABLET | ORAL | 1 refills | Status: DC

## 2023-09-18 NOTE — Telephone Encounter (Signed)
Received a referral from Dr. Lolly Mustache on this patient for CD-IOP.  This therapist calls Nicholas Fletcher to offer an appointment for him to come in for a CCA.  Therapist reaches VM and leaves a HIPAA compliant VM requesting a return call.  Remigio Eisenmenger, MS, LMFT, LCAS 09-18-23

## 2023-09-18 NOTE — Progress Notes (Signed)
Psychiatric Initial Adult Assessment    Virtual Visit via Video Note  I connected with Nicholas Fletcher on 09/18/23 at  9:00 AM EST by a video enabled telemedicine application and verified that I am speaking with the correct person using two identifiers.  Location: Patient: Home Provider: Office   I discussed the limitations of evaluation and management by telemedicine and the availability of in person appointments. The patient expressed understanding and agreed to proceed.   Patient Identification: Nicholas Fletcher MRN:  [id number] Date of Evaluation:  09/18/2023 Referral Source: Inpatient Chief Complaint:   Chief Complaint  Patient presents with   Establish Care   Anxiety   Visit Diagnosis:    ICD-10-CM   1. Alcohol use disorder  F10.90 naltrexone (DEPADE) 50 MG tablet    2. Anger  R45.4 lamoTRIgine (LAMICTAL) 25 MG tablet    3. Anxiety  F41.9       History of Present Illness: Nicholas Fletcher is 47 year old African-American man who is referred from inpatient services.  He was admitted in September for a week at behavioral health under involuntary commitment due to aggressive behavior, alcohol intoxication and needed stabilization.  Alcohol level was 238.  Patient was again admitted earlier this month due to seizure on the medical floor.  Patient was discharged from behavioral health on sertraline but he is not taking it.  He was also given hydroxyzine but apparently it was stopped after he started to having hallucination.  Patient saw a neurologist for seizures but currently not taking any seizure medication.  Patient reported history of drinking for more than 30 years.  He used to drink heavy but gradually had cut down but lately had 2 seizures in recent months.  Patient has difficulty remembering things and he admitted his short-term memory is not as good.  His wife is also present and I did talk to her at the end of the session.  Patient denies any crying spells, feeling of hopelessness or  worthlessness.  Denies any suicidal thoughts or psychosis.  He admitted having anger issues and marriage problems realized while he was drinking.  He admitted few months ago wife called 911 because he was intoxicated and may have been agitated.  Patient like to get some help.  Since he left the hospital he had 2 shots.  Sometime he has tremors.  He never recalls seeing a psychiatrist other than he was recently admitted to behavioral health.  He has never been in any therapy.  His wife reported a lot of irritability, anger issues and she believed patient minimizes his alcohol intake.  He also minimizes problems related to alcohol now patient like to get some help.  Patient and wife told that this is the last chance and he need to get some help.  Patient denies any other illegal substances.  He reported father had a history of cocaine and mother had history of alcohol.  Both parents are living in nursing home.  Patient is not working for more than a year after his truck broke down.  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  He admitted history of arrest and legal issues due to his behavior.  Denies any DUI.  Denies any suicidal attempt.  Patient lives with his wife.  He has 2 siblings one of them live in [place] and another live in [place].  They have 3 grown up children who are in college in [place].  Patient denies any significant weight loss.  He reported liver  enzymes were high but last blood work shows getting much better.  He has hyponatremia.  He is taking acid reflux medication pantoprazole that helps his stomach.  He was prescribed trazodone but lately he is not taking because his sleep is good.  As per record he was given Zoloft and hydroxyzine but he is noncompliant with the medication.  Associated Signs/Symptoms: Depression Symptoms:  difficulty concentrating, anxiety, (Hypo) Manic Symptoms:  Distractibility, Impulsivity, Labiality of Mood, Anxiety Symptoms:  Excessive  Worry, Psychotic Symptoms:   none reported PTSD Symptoms: Negative  Past Psychiatric History: History of inpatient in September 2024 due to aggressive behavior while intoxicated.  Discharged on Zoloft and hydroxyzine but not continue upon discharge.  No history of suicidal attempt.  History of drinking for more than 30 years.  History of seizures and tremors.  Previous Psychotropic Medications: Yes   Substance Abuse History in the last 12 months:  Yes.    Consequences of Substance Abuse: Medical Consequences:  History of seizures, high liver enzymes Family Consequences:  History of aggressive behavior while intoxicated causes a strained in his marriage life Withdrawal Symptoms:   Tremors Seizures, liver enzymes hide.  Past Medical History:  Past Medical History:  Diagnosis Date   Medical history non-contributory    Seizures (HCC)    pt reports having a seizure 05/2023    Past Surgical History:  Procedure Laterality Date   COLONOSCOPY WITH PROPOFOL N/A 09/26/2021   Procedure: COLONOSCOPY WITH PROPOFOL;  Surgeon: Regis Bill, MD;  Location: [facility] ENDOSCOPY;  Service: Endoscopy;  Laterality: N/A;   COLONOSCOPY WITH PROPOFOL N/A 09/28/2021   Procedure: COLONOSCOPY WITH PROPOFOL;  Surgeon: Regis Bill, MD;  Location: [facility] ENDOSCOPY;  Service: Endoscopy;  Laterality: N/A;   ESOPHAGOGASTRODUODENOSCOPY (EGD) WITH PROPOFOL N/A 09/26/2021   Procedure: ESOPHAGOGASTRODUODENOSCOPY (EGD) WITH PROPOFOL;  Surgeon: Regis Bill, MD;  Location: [facility] ENDOSCOPY;  Service: Endoscopy;  Laterality: N/A;   TONSILLECTOMY      Family Psychiatric History: Alcohol problem.  Father has a cocaine problem.  Family History:  Family History  Family history unknown: Yes    Social History:   Social History   Socioeconomic History   Marital status: Married    Spouse name: Not on file   Number of children: Not on file   Years of education: 16   Highest education level: Associate  degree: occupational, Scientist, product/process development, or vocational program  Occupational History   Not on file  Tobacco Use   Smoking status: Light Smoker    Types: Cigars   Smokeless tobacco: Never  Vaping Use   Vaping status: Every Day  Substance and Sexual Activity   Alcohol use: Yes    Alcohol/week: 4.0 standard drinks of alcohol    Types: 1 Cans of beer, 3 Shots of liquor per week   Drug use: Not Currently   Sexual activity: Not on file  Other Topics Concern   Not on file  Social History Narrative   Not on file   Social Determinants of Health   Financial Resource Strain: Not on file  Food Insecurity: No Food Insecurity (07/06/2023)   Hunger Vital Sign    Worried About Running Out of Food in the Last Year: Never true    Ran Out of Food in the Last Year: Never true  Transportation Needs: No Transportation Needs (07/06/2023)   PRAPARE - Administrator, Civil Service (Medical): No    Lack of Transportation (Non-Medical): No  Physical Activity: Not on file  Stress: Not on file  Social Connections: Unknown (03/06/2022)   [facility]   Social Network    Social Network: Not on file    Additional Social History: Born [place] raised in[place]  He has 2 other siblings who live in [place].  He finished high school and dropped out fourth year in college when he had a kid.  Patient married for 21 years.  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  His truck is in shop.  Allergies:  No Known Allergies  Metabolic Disorder Labs: No results found for: "HGBA1C", "MPG" No results found for: "PROLACTIN" No results found for: "CHOL", "TRIG", "HDL", "CHOLHDL", "VLDL", "LDLCALC" No results found for: "TSH"  Therapeutic Level Labs: No results found for: "LITHIUM" No results found for: "CBMZ" No results found for: "VALPROATE"  Current Medications: Current Outpatient Medications  Medication Sig Dispense Refill    acetaminophen (TYLENOL) 500 MG tablet Take 1,000 mg by mouth every 6 (six) hours as needed.     alum & mag hydroxide-simeth (MAALOX/MYLANTA) 200-200-20 MG/5ML suspension Take 15 mLs by mouth every 4 (four) hours as needed for indigestion or heartburn. 355 mL 0   aspirin EC 81 MG tablet Take 81-162 mg by mouth daily as needed for mild pain (pain score 1-3) or moderate pain (pain score 4-6). Swallow whole.     Multiple Vitamin (MULTIVITAMIN WITH MINERALS) TABS tablet Take 1 tablet by mouth daily. 30 tablet 0   Multiple Vitamins-Minerals (MENS MULTIVITAMIN PO) Take 1 each by mouth daily.     naphazoline-pheniramine (NAPHCON-A) 0.025-0.3 % ophthalmic solution Place 1 drop into both eyes 4 (four) times daily as needed for eye irritation. 15 mL 0   pantoprazole (PROTONIX) 40 MG tablet Take 1 tablet (40 mg total) by mouth 2 (two) times daily. 60 tablet 1   thiamine (VITAMIN B-1) 100 MG tablet Take 1 tablet (100 mg total) by mouth daily. (Patient taking differently: Take 100 mg by mouth daily as needed.) 30 tablet 0   traZODone (DESYREL) 50 MG tablet Take 1 tablet (50 mg total) by mouth at bedtime as needed for sleep. 30 tablet 0   No current facility-administered medications for this visit.    Musculoskeletal: Strength & Muscle Tone: within normal limits Gait & Station: normal Patient leans: N/A  Psychiatric Specialty Exam: Review of Systems  There were no vitals taken for this visit.There is no height or weight on file to calculate BMI.  General Appearance: Fairly Groomed and long beard  Eye Contact:  Fair  Speech:  Slow  Volume:  Decreased  Mood:  Anxious  Affect:  Congruent  Thought Process:  Descriptions of Associations: Intact  Orientation:  Full (Time, Place, and Person)  Thought Content:  Rumination  Suicidal Thoughts:  No  Homicidal Thoughts:  No  Memory:  Immediate;   Fair Recent;   Fair Remote;   Fair  Judgement:  Fair  Insight:  Shallow  Psychomotor Activity:  Decreased   Concentration:  Concentration: Fair and Attention Span: Fair  Recall:  [employer] of Knowledge:Fair  Language: Fair  Akathisia:  No  Handed:  Right  AIMS (if indicated):  not done  Assets:  Communication Skills Desire for Improvement Housing  ADL's:  Intact  Cognition: Impaired,  Mild  Sleep:  Good   Screenings: AUDIT    Flowsheet Row [facility]  Alcohol Use Disorder Identification Test Final Score (AUDIT) 12      Flowsheet Row ED to Hosp-Admission (Discharged) from 08/26/2023 in [place] [facility] PCU Admission (Discharged) [facility]04/2023 in [facility] Department at [facility]  C-SSRS RISK CATEGORY No Risk No Risk No Risk       Assessment and Plan: Patient is a 47 year old African-American, married man with significant history of alcohol use.  I review medical history, blood work results, current medication.  He is not taking Zoloft which was given from his last inpatient.  He is taking trazodone only as needed.  I discussed at length with him and his wife about his alcohol issues and mood symptoms.  He has some anxiety which could be related to his alcoholism.  Wife concerned about the manage in the future because patient continues to drink.  I recommend to consider CDIOP and patient agree with the plan.  We will contact the program coordinator to give him a call to orient about the program.  We also discussed to try naltrexone to help his alcohol craving.  We will also start low-dose Lamictal to help his mood symptoms.  Discussed the medications side effects and benefits specially Lamictal can cause rash and in that case he need to stop the medication immediately.  Provided education about alcohol related issues especially seizures, cirrhosis, memory problem.  He admitted having short-term memory and he is  seeing neurology and I encouraged to have discussion about his memory with the neurology and his future visits.  Discussed safety concerns and any time having active suicidal thoughts or homicidal thought then he need to call 911 or go to emergency room.  Follow-up in 6 weeks  Collaboration of Care: Other provider involved in patient's care AEB notes are available in epic to review.  I also reviewed from previous hospitalization.  Patient/Guardian was advised Release of Information must be obtained prior to any record release in order to collaborate their care with an outside provider. Patient/Guardian was advised if they have not already done so to contact the registration department to sign all necessary forms in order for [place] to release information regarding their care.   Consent: Patient/Guardian gives verbal consent for treatment and assignment of benefits for services provided during this visit. Patient/Guardian expressed understanding and agreed to proceed.    Follow Up Instructions:    I discussed the assessment and treatment plan with the patient. The patient was provided an opportunity to ask questions and all were answered. The patient agreed with the plan and demonstrated an understanding of the instructions.   The patient was advised to call back or seek an in-person evaluation if the symptoms worsen or if the condition fails to improve as anticipated.  I provided 80 minutes of non-face-to-face time during this encounter.  Cleotis Nipper, MD 11/27/20248:54 AM

## 2023-09-21 DIAGNOSIS — G40209 Localization-related (focal) (partial) symptomatic epilepsy and epileptic syndromes with complex partial seizures, not intractable, without status epilepticus: Secondary | ICD-10-CM | POA: Diagnosis not present

## 2023-09-21 DIAGNOSIS — G40909 Epilepsy, unspecified, not intractable, without status epilepticus: Secondary | ICD-10-CM | POA: Diagnosis not present

## 2023-09-25 ENCOUNTER — Encounter (HOSPITAL_COMMUNITY): Payer: Self-pay

## 2023-09-25 DIAGNOSIS — G40209 Localization-related (focal) (partial) symptomatic epilepsy and epileptic syndromes with complex partial seizures, not intractable, without status epilepticus: Secondary | ICD-10-CM | POA: Diagnosis not present

## 2023-10-31 ENCOUNTER — Encounter (HOSPITAL_COMMUNITY): Payer: Self-pay

## 2023-10-31 ENCOUNTER — Telehealth (HOSPITAL_BASED_OUTPATIENT_CLINIC_OR_DEPARTMENT_OTHER): Payer: Self-pay | Admitting: Psychiatry

## 2023-10-31 DIAGNOSIS — Z91199 Patient's noncompliance with other medical treatment and regimen due to unspecified reason: Secondary | ICD-10-CM

## 2023-10-31 NOTE — Progress Notes (Signed)
 Patient is not on video platform.  I called [phone] but could not leave the voicemail as it is not set up.  We will send a letter to reschedule appointment.

## 2023-11-07 ENCOUNTER — Ambulatory Visit (HOSPITAL_COMMUNITY): Payer: 59 | Admitting: Licensed Clinical Social Worker

## 2023-11-11 ENCOUNTER — Ambulatory Visit (HOSPITAL_COMMUNITY): Payer: 59 | Admitting: Licensed Clinical Social Worker

## 2023-12-05 ENCOUNTER — Ambulatory Visit (HOSPITAL_COMMUNITY): Payer: 59 | Admitting: Licensed Clinical Social Worker

## 2024-02-24 ENCOUNTER — Emergency Department

## 2024-02-24 ENCOUNTER — Encounter: Payer: Self-pay | Admitting: Emergency Medicine

## 2024-02-24 ENCOUNTER — Other Ambulatory Visit: Payer: Self-pay

## 2024-02-24 ENCOUNTER — Inpatient Hospital Stay
Admission: EM | Admit: 2024-02-24 | Discharge: 2024-02-29 | DRG: 640 | Disposition: A | Discharge status: Home / Self Care | Attending: Internal Medicine | Admitting: Internal Medicine

## 2024-02-24 DIAGNOSIS — K760 Fatty (change of) liver, not elsewhere classified: Secondary | ICD-10-CM | POA: Diagnosis present

## 2024-02-24 DIAGNOSIS — E872 Acidosis, unspecified: Secondary | ICD-10-CM | POA: Diagnosis present

## 2024-02-24 DIAGNOSIS — F17290 Nicotine dependence, other tobacco product, uncomplicated: Secondary | ICD-10-CM | POA: Diagnosis present

## 2024-02-24 DIAGNOSIS — F10939 Alcohol use, unspecified with withdrawal, unspecified: Secondary | ICD-10-CM | POA: Diagnosis present

## 2024-02-24 DIAGNOSIS — E871 Hypo-osmolality and hyponatremia: Principal | ICD-10-CM | POA: Diagnosis present

## 2024-02-24 DIAGNOSIS — F1090 Alcohol use, unspecified, uncomplicated: Secondary | ICD-10-CM

## 2024-02-24 DIAGNOSIS — Z7982 Long term (current) use of aspirin: Secondary | ICD-10-CM

## 2024-02-24 DIAGNOSIS — R4182 Altered mental status, unspecified: Principal | ICD-10-CM

## 2024-02-24 DIAGNOSIS — G9341 Metabolic encephalopathy: Secondary | ICD-10-CM | POA: Diagnosis present

## 2024-02-24 DIAGNOSIS — E861 Hypovolemia: Secondary | ICD-10-CM | POA: Diagnosis present

## 2024-02-24 DIAGNOSIS — F10139 Alcohol abuse with withdrawal, unspecified: Secondary | ICD-10-CM | POA: Diagnosis not present

## 2024-02-24 DIAGNOSIS — E860 Dehydration: Secondary | ICD-10-CM | POA: Diagnosis present

## 2024-02-24 DIAGNOSIS — R7989 Other specified abnormal findings of blood chemistry: Secondary | ICD-10-CM | POA: Insufficient documentation

## 2024-02-24 DIAGNOSIS — R197 Diarrhea, unspecified: Secondary | ICD-10-CM | POA: Diagnosis present

## 2024-02-24 DIAGNOSIS — Z79899 Other long term (current) drug therapy: Secondary | ICD-10-CM

## 2024-02-24 DIAGNOSIS — E878 Other disorders of electrolyte and fluid balance, not elsewhere classified: Secondary | ICD-10-CM | POA: Diagnosis present

## 2024-02-24 DIAGNOSIS — F1010 Alcohol abuse, uncomplicated: Secondary | ICD-10-CM | POA: Diagnosis present

## 2024-02-24 DIAGNOSIS — E876 Hypokalemia: Secondary | ICD-10-CM | POA: Diagnosis present

## 2024-02-24 DIAGNOSIS — R1110 Vomiting, unspecified: Secondary | ICD-10-CM | POA: Diagnosis present

## 2024-02-24 LAB — COMPREHENSIVE METABOLIC PANEL WITH GFR
ALT: 66 U/L — ABNORMAL HIGH (ref 0–44)
AST: 235 U/L — ABNORMAL HIGH (ref 15–41)
Albumin: 4.2 g/dL (ref 3.5–5.0)
Alkaline Phosphatase: 104 U/L (ref 38–126)
Anion gap: 24 — ABNORMAL HIGH (ref 5–15)
BUN: 5 mg/dL — ABNORMAL LOW (ref 6–20)
CO2: 22 mmol/L (ref 22–32)
Calcium: 8.8 mg/dL — ABNORMAL LOW (ref 8.9–10.3)
Chloride: 67 mmol/L — ABNORMAL LOW (ref 98–111)
Creatinine, Ser: 0.65 mg/dL (ref 0.61–1.24)
GFR, Estimated: >60 mL/min (ref >60)
Glucose, Bld: 83 mg/dL (ref 70–99)
Potassium: 2.8 mmol/L — ABNORMAL LOW (ref 3.5–5.1)
Sodium: 113 mmol/L — CL (ref 135–145)
Total Bilirubin: 3.6 mg/dL — ABNORMAL HIGH (ref 0.0–1.2)
Total Protein: 7.3 g/dL (ref 6.5–8.1)

## 2024-02-24 LAB — CBG MONITORING, ED: Glucose-Capillary: 94 mg/dL (ref 70–99)

## 2024-02-24 LAB — TROPONIN I (HIGH SENSITIVITY): Troponin I (High Sensitivity): 4 ng/L (ref <18)

## 2024-02-24 MED ORDER — THIAMINE MONONITRATE 100 MG PO TABS: 100.0000 mg | ORAL_TABLET | Freq: Every day | ORAL | Status: DC

## 2024-02-24 MED ORDER — SODIUM CHLORIDE 0.9 % IV BOLUS
1000.0000 mL | Freq: Once | INTRAVENOUS | Status: AC
  Administered 2024-02-24: 1000 mL via INTRAVENOUS

## 2024-02-24 MED ORDER — LORAZEPAM 1 MG PO TABS: 1.0000 mg | ORAL_TABLET | ORAL | Status: DC | PRN

## 2024-02-24 MED ORDER — LORAZEPAM 2 MG/ML IJ SOLN
1.0000 mg | INTRAMUSCULAR | Status: DC | PRN
  Administered 2024-02-25: 1 mg via INTRAVENOUS
  Filled 2024-02-24: qty 1

## 2024-02-24 MED ORDER — ADULT MULTIVITAMIN W/MINERALS CH
1.0000 | ORAL_TABLET | Freq: Every day | ORAL | Status: DC
  Administered 2024-02-25 – 2024-02-29 (×5): 1 via ORAL
  Filled 2024-02-24 (×5): qty 1

## 2024-02-24 MED ORDER — POTASSIUM CHLORIDE 10 MEQ/100ML IV SOLN
10.0000 meq | INTRAVENOUS | Status: AC
  Administered 2024-02-24 – 2024-02-25 (×2): 10 meq via INTRAVENOUS
  Filled 2024-02-24 (×2): qty 100

## 2024-02-24 MED ORDER — THIAMINE HCL 100 MG/ML IJ SOLN: 100.0000 mg | Freq: Every day | INTRAMUSCULAR | Status: DC

## 2024-02-24 MED ORDER — FOLIC ACID 1 MG PO TABS
1.0000 mg | ORAL_TABLET | Freq: Every day | ORAL | Status: DC
  Administered 2024-02-25 – 2024-02-29 (×5): 1 mg via ORAL
  Filled 2024-02-24 (×5): qty 1

## 2024-02-24 MED ORDER — SODIUM CHLORIDE 3 % IV BOLUS
150.0000 mL | Freq: Once | INTRAVENOUS | Status: AC
  Administered 2024-02-25: 150 mL via INTRAVENOUS
  Filled 2024-02-24: qty 500

## 2024-02-24 NOTE — ED Provider Notes (Signed)
 [facility] Provider Note    Event Date/Time   First MD Initiated Contact with Patient 02/24/24 2337     (approximate)   History   Altered Mental Status   HPI  Nicholas Fletcher is a 48 y.o. male   Past medical history of daily drinker, history of withdrawal seizures, who presents to the emergency department with altered mental status, wife noticed progressively worsening throughout the day.  He drinks about 6 drinks a day.  Over the past several days he has had some nausea and vomiting and diarrhea attributed to a GI illness, and has had poor p.o. intake.  He has continued drink alcohol.  Today his wife noticed that he was more confused, slow speech, stuttering his speech.  There was no trauma reported by patient nor witnessed by wife.  His labs showed that he was severely hyponatremic so he was brought back immediately from the waiting room.  Independent Historian contributed to assessment above: His wife gives collateral information as above     Physical Exam   Triage Vital Signs: ED Triage Vitals  Encounter Vitals Group     BP 02/24/24 2222 (!) 145/94     Systolic BP Percentile --      Diastolic BP Percentile --      Pulse Rate 02/24/24 2222 97     Resp 02/24/24 2222 18     Temp 02/24/24 2222 98.6 F (37 C)     Temp Source 02/24/24 2222 Oral     SpO2 02/24/24 2222 100 %     Weight 02/24/24 2219 180 lb (81.6 kg)     Height 02/24/24 2219 6' (1.829 m)     Head Circumference --      Peak Flow --      Pain Score 02/24/24 2218 8     Pain Loc --      Pain Education --      Exclude from Growth Chart --     Most recent vital signs: Vitals:   02/24/24 2222  BP: (!) 145/94  Pulse: 97  Resp: 18  Temp: 98.6 F (37 C)  SpO2: 100%    General: Awake, no distress.  CV:  Good peripheral perfusion.  Resp:  Normal effort.  Abd:  No distention. Other:  Awake and answering questions appropriately.  Rolls his eyes back and shuts his eyes often  throughout our conversation.  Slurred speech and slight stuttering speech.  Soft nontender abdomen with no rigidity or guarding.   ED Results / Procedures / Treatments   Labs (all labs ordered are listed, but only abnormal results are displayed) Labs Reviewed  COMPREHENSIVE METABOLIC PANEL WITH GFR - Abnormal; Notable for the following components:      Result Value   Sodium 113 (*)    Potassium 2.8 (*)    Chloride 67 (*)    BUN 5 (*)    Calcium 8.8 (*)    AST 235 (*)    ALT 66 (*)    Total Bilirubin 3.6 (*)    Anion gap 24 (*)    All other components within normal limits  CBC WITH DIFFERENTIAL/PLATELET  URINALYSIS, ROUTINE W REFLEX MICROSCOPIC  MAGNESIUM   CBG MONITORING, ED  TROPONIN I (HIGH SENSITIVITY)  TROPONIN I (HIGH SENSITIVITY)     I ordered and reviewed the above labs they are notable for hyponatremia 113 and hypokalemia 2.8.  AST to ALT ratio elevated and what is expected with EtOH use history.  EKG  ED ECG REPORT I, Buell Carmin, the attending physician, personally viewed and interpreted this ECG.   Date: 02/24/2024  EKG Time: 2226  Rate: 89  Rhythm: sinus  Axis: nl  Intervals:prolonged QTC  ST&T Change: no stemi    RADIOLOGY I independently reviewed and interpreted CT scan of the head and see no obvious bleeding or midline shift I also reviewed radiologist's formal read.   PROCEDURES:  Critical Care performed: Yes, see critical care procedure note(s)  .Critical Care  Performed by: Buell Carmin, MD Authorized by: Buell Carmin, MD   Critical care provider statement:    Critical care time (minutes):  30   Critical care was time spent personally by me on the following activities:  Development of treatment plan with patient or surrogate, discussions with consultants, evaluation of patient's response to treatment, examination of patient, ordering and review of laboratory studies, ordering and review of radiographic studies, ordering and performing  treatments and interventions, pulse oximetry, re-evaluation of patient's condition and review of old charts    MEDICATIONS ORDERED IN ED: Medications  sodium chloride  3% (hypertonic) IV bolus 150 mL (has no administration in time range)  potassium chloride  10 mEq in 100 mL IVPB (10 mEq Intravenous New Bag/Given 02/24/24 2351)  LORazepam  (ATIVAN ) tablet 1-4 mg (has no administration in time range)    Or  LORazepam  (ATIVAN ) injection 1-4 mg (has no administration in time range)  thiamine  (VITAMIN B1) tablet 100 mg (has no administration in time range)    Or  thiamine  (VITAMIN B1) injection 100 mg (has no administration in time range)  folic acid  (FOLVITE ) tablet 1 mg (has no administration in time range)  multivitamin with minerals tablet 1 tablet (has no administration in time range)  sodium chloride  0.9 % bolus 1,000 mL (1,000 mLs Intravenous New Bag/Given 02/24/24 2349)    External physician / consultants:  I spoke with BrittonLEe of ICU regarding care plan for this patient.   IMPRESSION / MDM / ASSESSMENT AND PLAN / ED COURSE  I reviewed the triage vital signs and the nursing notes.                                Patient's presentation is most consistent with acute presentation with potential threat to life or bodily function.  Differential diagnosis includes, but is not limited to, hyponatremia due to GI losses and chronic alcohol use, severe hyponatremia, trauma, infection, intra-abdominal infection   The patient is on the cardiac monitor to evaluate for evidence of arrhythmia and/or significant heart rate changes.  MDM:    I think that his hyponatremia with severe features altered mental status but no seizures is due to his chronic alcohol use and recent GI illness and poor p.o. intake.  He looks dehydrated with dry mucous membranes.  He is quite altered but maintaining airway will defer intubation but will proceed with a hypertonic bolus as well as normal saline bolus and  replete potassium.  Will need careful correction to avoid overcorrection but I think hypertonic is important at this time given his quite altered state due to his hyponatremia.  Benign abdominal exam at this time rules against surgical abdominal pathologies.  His LFT elevation is consistent with his alcohol use.  I have contacted ICU for admission.  I think his altered mental status is due to his hyponatremia rather than alcohol withdrawal at this time as he has no autonomic instability but I put  him on CIWA.      FINAL CLINICAL IMPRESSION(S) / ED DIAGNOSES   Final diagnoses:  Altered mental status, unspecified altered mental status type  Hyponatremia  Alcohol use  Hypokalemia     Rx / DC Orders   ED Discharge Orders     None        Note:  This document was prepared using Dragon voice recognition software and may include unintentional dictation errors.    Buell Carmin, MD 02/24/24 [phone]

## 2024-02-24 NOTE — ED Triage Notes (Signed)
 Pt presents to the ED via POV with complaints of AMS that started tonight around 1700. Per S/O the patient has had excessive thirst, sluggish responses and CP that started about an hour ago. Pt states he feels like he's having an "out of body experience". Hx of seizures - not on medications. A&Ox4 at this time. Denies CP or  SOB.    CBG-94 in triage.

## 2024-02-24 NOTE — ED Notes (Signed)
 Lab reports Na 113; acuity level changed and pt taken to room 26 for further eval; report given to Dorian, Charity fundraiser

## 2024-02-24 NOTE — H&P (Incomplete)
 NAME:  Nicholas Fletcher, MRN:  [id number], DOB:  [birth year], LOS: 0 ADMISSION DATE:  02/24/2024, CONSULTATION DATE:  02/25/24 REFERRING MD:  Dr. Margery Sheets, CHIEF COMPLAINT:  Altered Mental Status   History of Present Illness:  48 yo M presenting to [facility] ED from home for evaluation of altered mental status.  History obtained per chart review and patient/spouse bedside report.*** Spouse noted the patient's mentation has progressively worsened throughout the day. On 02/24/24 she noticed the patient to have confused, slow and stuttering speech. He also has been experiencing nausea/ vomiting and diarrhea with poor intake for *** days. During this he has continued to drink alcohol, about 6 drinks daily. She denied trauma ED course: ***. Medications given: *** Initial Vitals: *** Significant labs: (Labs/ Imaging personally reviewed) I, Recardo Canal Rust-Chester, AGACNP-BC, personally viewed and interpreted this ECG. EKG Interpretation: Date: ***, EKG Time: ***, Rate: ***, Rhythm: ***, QRS Axis:  *** Intervals: ***, ST/T Wave abnormalities: ***, Narrative Interpretation: *** Chemistry: Na+:113, K+: 2.8, BUN/Cr.: , Serum CO2/ AG: *** Hematology: WBC: ***, Hgb: ***,  Troponin: ***, BNP: ***, Lactic/ PCT: ***, COVID-19 & Influenza A/B: *** ABG: *** CXR ***: *** CT ***: ***  PCCM consulted for admission due to ***.  Pertinent  Medical History  ETOH abuse (6 drinks daily) Withdrawal seizures Hyponatremia  Significant Hospital Events: Including procedures, antibiotic start and stop dates in addition to other pertinent events   02/25/24: Admit to ICU with symptomatic hyponatremia***  Interim History / Subjective:  ***  Objective   Blood pressure (!) 145/94, pulse 97, temperature 98.6 F (37 C), temperature source Oral, resp. rate 18, height 6' (1.829 m), weight 81.6 kg, SpO2 100%.       No intake or output data in the 24 hours ending 02/24/24 2356 Filed Weights   02/24/24 2219  Weight: 81.6 kg     Examination: General: Adult ***, critically***acutely ill, lying in bed intubated & sedated requiring mechanical ventilation *** NAD HEENT: MM pink/moist, anicteric***, atraumatic, neck supple Neuro: A&O x *** commands, PERRL *** , MAE CV: s1s2 ***RRR, *** on monitor, no r/m/g Pulm: Regular, non labored on *** , breath sounds ***-BUL & ***-BLL GI: soft, ***, non***tender, bs x 4 GU: foley in place *** with clear yellow urine Skin: *** no rashes/lesions noted Extremities: warm/dry, pulses + 2 R/P, *** edema noted  Resolved Hospital Problem list     Assessment & Plan:  Acute /*** Chronic Hypovolemic/***Euvolemic/***Edematous - Hypotonic/***Isotonic/*** Hypertonic Hyponatremia secondary to *** in the setting of *** S/s: lethargy**AMS***coma, seizure activity?***, hypertensive/***hypotensive/ *** orthostatic, edema present?*** CTH ***: *** Baseline Na+: ***, Na+ on admission: ***, Urine osmolality: ***, Serum osmolality: *** Ur Na+: ***, Ur Cr: ***, FENa+: ***, Ur K+: *** (euvolemic hypotonic only) - Q 2-**4 h sodium checks - careful IVF resuscitation: 3%***/0.9% NS @ *** mL/h  - Target Na+: ***, careful not to raise Na+ level more then: 0.5-1 mmol/h OR 8-10 mmol/L in first 24 hours - Consider *** Nephrology consult, appreciate input  ~ Can get uric acid level if fluid volume status is difficult to ascertain > elevated in hypovolemia ~ TSH & cortisol in euvolemic hypotonic hyponatremia ~ osmolar gap to evaluate hypertonic hyponatremia ~ Ur osmo < 100 when kidneys are appropriately diluting urine, > 100 when water is being re-absorbed by ADH ~ Ur Na+ is low in volume depletion  ~ Ur Na+ + Ur K+/ serum Na+ ratio is helpful in guiding fluid restriction: 1: no free water; 0.5-1.0:  500 mL; 0.5: 1L  Hyponatremia Hypotonic:  a. hypovolemic vs b. isovolemic vs c. edematous >> a. - Renal losses: diuretics, post obstructive          - GI losses: vomiting & diarrhea          - burns           - adrenal insufficiency >> b. - SIADH         - water intoxication         - hypothyroidism         - adrenal insufficiency         - Sheehan's syndrome         - medication: ACE & thiazides >> c.- CHF        - nephrotic syndrome         - cirrhosis         - pregnancy  2. Isotonic (aka pseudohyponatremia)  - HLD - Hyperproteinemia  3. Hypertonic: - Hyperglycemia - hypertonic infusions:  mannitol > TPN & others    Best Practice (right click and "Reselect all SmartList Selections" daily)  Diet/type: {diet type:25684} DVT prophylaxis {anticoagulation:25687} Pressure ulcer(s): {pressure ulcer(s):31683} GI prophylaxis: {WU:98119} Lines: {Central Venous Access:25771} Foley:  {Central Venous Access:25691} Code Status:  {Code Status:26939} Last date of multidisciplinary goals of care discussion [***]  Labs   CBC: No results for input(s): "WBC", "NEUTROABS", "HGB", "HCT", "MCV", "PLT" in the last 168 hours.  Basic Metabolic Panel: Recent Labs  Lab 02/24/24 2227  NA 113*  K 2.8*  CL 67*  CO2 22  GLUCOSE 83  BUN 5*  CREATININE 0.65  CALCIUM 8.8*   GFR: Estimated Creatinine Clearance: 121.3 mL/min (by C-G formula based on SCr of 0.65 mg/dL). No results for input(s): "PROCALCITON", "WBC", "LATICACIDVEN" in the last 168 hours.  Liver Function Tests: Recent Labs  Lab 02/24/24 2227  AST 235*  ALT 66*  ALKPHOS 104  BILITOT 3.6*  PROT 7.3  ALBUMIN 4.2   No results for input(s): "LIPASE", "AMYLASE" in the last 168 hours. No results for input(s): "AMMONIA" in the last 168 hours.  ABG No results found for: "PHART", "PCO2ART", "PO2ART", "HCO3", "TCO2", "ACIDBASEDEF", "O2SAT"   Coagulation Profile: No results for input(s): "INR", "PROTIME" in the last 168 hours.  Cardiac Enzymes: No results for input(s): "CKTOTAL", "CKMB", "CKMBINDEX", "TROPONINI" in the last 168 hours.  HbA1C: No results found for: "HGBA1C"  CBG: Recent Labs  Lab 02/24/24 2221  GLUCAP  94    Review of Systems: positives in BOLD***  Gen: Denies fever, chills, weight change, fatigue, night sweats HEENT: Denies blurred vision, double vision, hearing loss, tinnitus, sinus congestion, rhinorrhea, sore throat, neck stiffness, dysphagia PULM: Denies shortness of breath, cough, sputum production, hemoptysis, wheezing CV: Denies chest pain, edema, orthopnea, paroxysmal nocturnal dyspnea, palpitations GI: Denies abdominal pain, nausea, vomiting, diarrhea, hematochezia, melena, constipation, change in bowel habits GU: Denies dysuria, hematuria, polyuria, oliguria, urethral discharge Endocrine: Denies hot or cold intolerance, polyuria, polyphagia or appetite change Derm: Denies rash, dry skin, scaling or peeling skin change Heme: Denies easy bruising, bleeding, bleeding gums Neuro: Denies headache, numbness, weakness, slurred speech, loss of memory or consciousness  Past Medical History:  He,  has a past medical history of Medical history non-contributory and Seizures (HCC).   Surgical History:   Past Surgical History:  Procedure Laterality Date  . COLONOSCOPY WITH PROPOFOL  N/A 09/26/2021   Procedure: COLONOSCOPY WITH PROPOFOL ;  Surgeon: Shane Darling, MD;  Location: [facility] ENDOSCOPY;  Service: Endoscopy;  Laterality: N/A;  . COLONOSCOPY WITH PROPOFOL  N/A 09/28/2021   Procedure: COLONOSCOPY WITH PROPOFOL ;  Surgeon: Shane Darling, MD;  Location: [facility] ENDOSCOPY;  Service: Endoscopy;  Laterality: N/A;  . ESOPHAGOGASTRODUODENOSCOPY (EGD) WITH PROPOFOL  N/A 09/26/2021   Procedure: ESOPHAGOGASTRODUODENOSCOPY (EGD) WITH PROPOFOL ;  Surgeon: Shane Darling, MD;  Location: [facility] ENDOSCOPY;  Service: Endoscopy;  Laterality: N/A;  . TONSILLECTOMY       Social History:   reports that he has been smoking cigars. He has never used smokeless tobacco. He reports current alcohol use of about 4.0 standard drinks of alcohol per week. He reports that he does not currently use drugs.    Family History:  His Family history is unknown by patient.   Allergies No Known Allergies   Home Medications  Prior to Admission medications   Medication Sig Start Date End Date Taking? Authorizing Provider  acetaminophen  (TYLENOL ) 500 MG tablet Take 1,000 mg by mouth every 6 (six) hours as needed.    [provider]  alum & mag hydroxide-simeth (MAALOX/MYLANTA) 200-200-20 MG/5ML suspension Take 15 mLs by mouth every 4 (four) hours as needed for indigestion or heartburn. 08/29/23   Ghimire, Estil Heman, MD  aspirin EC 81 MG tablet Take 81-162 mg by mouth daily as needed for mild pain (pain score 1-3) or moderate pain (pain score 4-6). Swallow whole.    [provider]  lamoTRIgine  (LAMICTAL ) 25 MG tablet Take one tab daily for one week and than two tab daily 09/18/23   Arfeen, Bronson Canny, MD  Multiple Vitamin (MULTIVITAMIN WITH MINERALS) TABS tablet Take 1 tablet by mouth daily. 07/12/23   Ntuen, Tina C, FNP  Multiple Vitamins-Minerals (MENS MULTIVITAMIN PO) Take 1 each by mouth daily.    [provider]  naltrexone  (DEPADE) 50 MG tablet Take 1 tablet (50 mg total) by mouth daily. 09/18/23   Arfeen, Bronson Canny, MD  naphazoline-pheniramine (NAPHCON-A) 0.025-0.3 % ophthalmic solution Place 1 drop into both eyes 4 (four) times daily as needed for eye irritation. 07/11/23   Ntuen, Tina C, FNP  pantoprazole  (PROTONIX ) 40 MG tablet Take 1 tablet (40 mg total) by mouth 2 (two) times daily. 08/29/23   Ghimire, Estil Heman, MD  thiamine  (VITAMIN B-1) 100 MG tablet Take 1 tablet (100 mg total) by mouth daily. Patient taking differently: Take 100 mg by mouth daily as needed. 07/12/23   Ntuen, Tina C, FNP  traZODone  (DESYREL ) 50 MG tablet Take 1 tablet (50 mg total) by mouth at bedtime as needed for sleep. 08/29/23   GhimireEstil Heman, MD     Critical care time: ***       Recardo Canal Rust-Chester, AGACNP-BC Acute Care Nurse Practitioner [facility]    [phone] / [phone] Please see Amion for details.

## 2024-02-24 NOTE — H&P (Signed)
 NAME:  Nicholas Fletcher, MRN:  [id number], DOB:  1976/08/09, LOS: 0 ADMISSION DATE:  02/24/2024, CONSULTATION DATE:  02/25/24 REFERRING MD:  Dr. Margery Sheets, CHIEF COMPLAINT:  Altered Mental Status   History of Present Illness:  48 yo M presenting to [facility] ED from home for evaluation of altered mental status.  History obtained per chart review and patient/spouse bedside report. On 02/22/24 patient thought he had a "cold or allergies". They reported nausea/ vomiting and diarrhea with fatigue, headache, chills and productive cough. Over the last few days they also noticed poor food intake and that the patient was always thirsty. Spouse reports he drank "about a gallon of water" over the last 24 hours. He endorses dizziness, muscle cramping, tremors. They deny seizure like activity, blurred vision, falls/trauma, fevers, or congestion. His wife also noticed on 02/24/24 that the patient starting having confused, slow and stuttering speech. During this he has continued to drink alcohol. He normally drinks 4-6 shots of liquor daily. On 5/3 he had 4 shots, on 5/4 he had 2 shots and only one beer on 5/5 around noon.  Of note patient had been admitted multiple times in 2023 & 2024 for hyponatremia and ETOH withdrawal with seizure activity. He was seen outpatient by a neurologist in 08/2023 and underwent an EEG, sleep EEG and MRI brain. He was not started on any AED. He was started on Lamictal  in 08/2023 by a psychiatrist for mood stability which he reports no longer taking. He denies recreational drug use, but does report daily vaping. 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  ED course: Upon arrival patient alert but altered with stable vitals. Labs significant for severe hyponatremia, hypokalemia, hypochloremia, chronic appearing Transaminitis, hypomagnesemia, lactic acidosis, elevated PCT, hypo-osmality. Imaging unremarkable.  Medications given: 1 L NS bolus and 150 mL bolus of 3% NaCL, 20 meq of K+  supplementation Initial Vitals: 98.6,, 18, 97, 145/94 & 100% on RA Significant labs: (Labs/ Imaging personally reviewed) I, Recardo Canal Rust-Chester, AGACNP-BC, personally viewed and interpreted this ECG. EKG Interpretation: Date: 02/24/24, EKG Time: 22:26, Rate: 89, Rhythm: NSR, QRS Axis:  normal, Intervals: prolonged Qtc, ST/T Wave abnormalities: none, Narrative Interpretation: NSR with prolonged Qtc Chemistry: Na+:113, K+: 2.8, BUN/Cr.: 5/ 0.65, Serum CO2/ AG: 22/24, AST/ALT: 235/ 66, Cl: 67 Hematology: WBC: recollect in process , Hgb: recollect in process,  Troponin: 4, Lactic/ PCT: 5.4/ 0.69, COVID-19 & Influenza A/B: pending  CXR 02/24/24: no active cardiopulmonary disease CT head wo contrast 02/24/24: no acute intracranial abnormality  PCCM consulted for admission due to acute symptomatic hypotonic hyponatremia in the setting of ETOH abuse and 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  Pertinent  Medical History  ETOH abuse (6 drinks daily) Withdrawal seizures Hyponatremia Vape use Diverticulitis GI bleed Hepatic Steatosis Severe Esophagitis  Significant Hospital Events: Including procedures, antibiotic start and stop dates in addition to other pertinent events   02/25/24: Admit to ICU with acute symptomatic hypotonic hyponatremia in the setting of ETOH abuse and 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 Possible sepsis s/t unknown etiology.  Interim History / Subjective:  Patient alert and responsive, technically A&O x 4. However speech is slow and slightly slurred. Wife bedside. Plan of care discussed, all questions and concerns answered at this time.  Objective   Blood pressure (!) 145/94, pulse 97, temperature 98.6 F (37 C), temperature source Oral, resp. rate 18, height 6' (1.829 m), weight 81.6 kg, SpO2 100%.       No intake or output data in the 24 hours  ending 02/24/24 2356 Filed Weights   02/24/24 2219  Weight: 81.6 kg    Examination: General: Adult male, acutely ill, lying in bed, NAD HEENT: MM  pink/moist, anicteric, atraumatic, neck supple Neuro: A&O x 4, able to follow commands, PERRL +3, MAE with generalized weakness CV: s1s2 RRR, NSR on monitor, no r/m/g Pulm: Regular, non labored on RA , breath sounds clear-BUL & diminished-BLL GI: soft, rounded, non tender, bs x 4 GU: voiding in urinal with clear yellow urine Skin: limited exam-no rashes/lesions noted Extremities: warm/dry, pulses + 2 R/P, no edema noted  Resolved Hospital Problem list     Assessment & Plan:  Acute Symptomatic Hypotonic Hyponatremia secondary to suspected polydipsia in the setting of ETOH abuse and GI losses Acute Encephalopathy Baseline Na+: 133, Na+ on admission: 113 - Q 2- 4 h sodium checks - careful IVF resuscitation: NS @ 100 mL/h, monitor next Na level closely - Target Na+: 119 by 5 am, 123 by 5/6 @ 22:00, careful not to raise Na+ level more then: 0.5-1 mmol/h OR 8-10 mmol/L in first 24 hours - Strict I/O's: alert provider if UOP < 0.5 mL/kg/hr - Consider Nephrology consultation depending on trend  Lactic Acidosis  Vomiting & Diarrhea PCT mildly elevated - f/u CT abdomen/pelvis & CBC recollection - trend lactic/ PCT - Daily CBC, monitor WBC/ fever curve - IV antibiotics: zosyn  - GI panel & C. diff PCR ordered  Qtc prolongation - continuous cardiac monitoring - Mg supplementation - f/u EKG in AM  Hypokalemia Hypomagnesemia Hypophosphatemia - additional K+ supplementation and Mg supplementation ordered - Daily BMP, replace electrolytes PRN  ETOH abuse  Elevated AST/ALT secondary to chronic alcohol use - ativan  per CIWA protocol - high dose thiamine  x 4 doses followed by 100 mg daily - folic acid , multivitamin daily - Trend hepatic function, f/u ammonia level - avoid hepatotoxic agents  Best Practice (right click and "Reselect all SmartList Selections" daily)  Diet/type: NPO w/ oral meds DVT prophylaxis LMWH Pressure ulcer(s): N/A GI prophylaxis: PPI Lines: N/A Foley:   N/A Code Status:  full code Last date of multidisciplinary goals of care discussion [02/25/24]  Labs   CBC: No results for input(s): "WBC", "NEUTROABS", "HGB", "HCT", "MCV", "PLT" in the last 168 hours.  Basic Metabolic Panel: Recent Labs  Lab 02/24/24 2227  NA 113*  K 2.8*  CL 67*  CO2 22  GLUCOSE 83  BUN 5*  CREATININE 0.65  CALCIUM 8.8*   GFR: Estimated Creatinine Clearance: 121.3 mL/min (by C-G formula based on SCr of 0.65 mg/dL). No results for input(s): "PROCALCITON", "WBC", "LATICACIDVEN" in the last 168 hours.  Liver Function Tests: Recent Labs  Lab 02/24/24 2227  AST 235*  ALT 66*  ALKPHOS 104  BILITOT 3.6*  PROT 7.3  ALBUMIN 4.2   No results for input(s): "LIPASE", "AMYLASE" in the last 168 hours. No results for input(s): "AMMONIA" in the last 168 hours.  ABG No results found for: "PHART", "PCO2ART", "PO2ART", "HCO3", "TCO2", "ACIDBASEDEF", "O2SAT"   Coagulation Profile: No results for input(s): "INR", "PROTIME" in the last 168 hours.  Cardiac Enzymes: No results for input(s): "CKTOTAL", "CKMB", "CKMBINDEX", "TROPONINI" in the last 168 hours.  HbA1C: No results found for: "HGBA1C"  CBG: Recent Labs  Lab 02/24/24 2221  GLUCAP 94    Review of Systems: positives in BOLD  Gen: Denies fever, chills, weight change, fatigue, night sweats HEENT: Denies blurred vision, double vision, hearing loss, tinnitus, sinus congestion, rhinorrhea, sore throat, neck stiffness, dysphagia PULM:  Denies shortness of breath, cough, sputum production, hemoptysis, wheezing CV: Denies chest pain, edema, orthopnea, paroxysmal nocturnal dyspnea, palpitations GI: Denies abdominal pain, nausea, vomiting, diarrhea, hematochezia, melena, constipation, change in bowel habits GU: Denies dysuria, hematuria, polyuria, oliguria, urethral discharge Endocrine: Denies hot or cold intolerance, polyuria, polyphagia or appetite change, polydipsia Derm: Denies rash, dry skin, scaling or  peeling skin change Heme: Denies easy bruising, bleeding, bleeding gums Neuro: Denies headache, numbness, weakness, slurred speech, loss of memory or consciousness  Past Medical History:  He,  has a past medical history of Medical history non-contributory and Seizures (HCC).   Surgical History:   Past Surgical History:  Procedure Laterality Date   COLONOSCOPY WITH PROPOFOL  N/A 09/26/2021   Procedure: COLONOSCOPY WITH PROPOFOL ;  Surgeon: Shane Darling, MD;  Location: [facility] ENDOSCOPY;  Service: Endoscopy;  Laterality: N/A;   COLONOSCOPY WITH PROPOFOL  N/A 09/28/2021   Procedure: COLONOSCOPY WITH PROPOFOL ;  Surgeon: Shane Darling, MD;  Location: [facility] ENDOSCOPY;  Service: Endoscopy;  Laterality: N/A;   ESOPHAGOGASTRODUODENOSCOPY (EGD) WITH PROPOFOL  N/A 09/26/2021   Procedure: ESOPHAGOGASTRODUODENOSCOPY (EGD) WITH PROPOFOL ;  Surgeon: Shane Darling, MD;  Location: [facility] ENDOSCOPY;  Service: Endoscopy;  Laterality: N/A;   TONSILLECTOMY       Social History:   reports that he has been smoking cigars. He has never used smokeless tobacco. He reports current alcohol use of about 4.0 standard drinks of alcohol per week. He reports that he does not currently use drugs.   Family History:  His Family history is unknown by patient.   Allergies No Known Allergies   Home Medications  Prior to Admission medications   Medication Sig Start Date End Date Taking? Authorizing Provider  acetaminophen  (TYLENOL ) 500 MG tablet Take 1,000 mg by mouth every 6 (six) hours as needed.    [provider]  alum & mag hydroxide-simeth (MAALOX/MYLANTA) 200-200-20 MG/5ML suspension Take 15 mLs by mouth every 4 (four) hours as needed for indigestion or heartburn. 08/29/23   Ghimire, Estil Heman, MD  aspirin EC 81 MG tablet Take 81-162 mg by mouth daily as needed for mild pain (pain score 1-3) or moderate pain (pain score 4-6). Swallow whole.    [provider]  lamoTRIgine  (LAMICTAL ) 25 MG  tablet Take one tab daily for one week and than two tab daily 09/18/23   Arfeen, Bronson Canny, MD  Multiple Vitamin (MULTIVITAMIN WITH MINERALS) TABS tablet Take 1 tablet by mouth daily. 07/12/23   Ntuen, Tina C, FNP  Multiple Vitamins-Minerals (MENS MULTIVITAMIN PO) Take 1 each by mouth daily.    [provider]  naltrexone  (DEPADE) 50 MG tablet Take 1 tablet (50 mg total) by mouth daily. 09/18/23   Arfeen, Bronson Canny, MD  naphazoline-pheniramine (NAPHCON-A) 0.025-0.3 % ophthalmic solution Place 1 drop into both eyes 4 (four) times daily as needed for eye irritation. 07/11/23   Ntuen, Tina C, FNP  pantoprazole  (PROTONIX ) 40 MG tablet Take 1 tablet (40 mg total) by mouth 2 (two) times daily. 08/29/23   Ghimire, Estil Heman, MD  thiamine  (VITAMIN B-1) 100 MG tablet Take 1 tablet (100 mg total) by mouth daily. Patient taking differently: Take 100 mg by mouth daily as needed. 07/12/23   Ntuen, Tina C, FNP  traZODone  (DESYREL ) 50 MG tablet Take 1 tablet (50 mg total) by mouth at bedtime as needed for sleep. 08/29/23   GhimireEstil Heman, MD     Critical care time: 68 minutes       Recardo Canal Rust-[place]ster, AGACNP-BC  Acute Care Nurse Practitioner [facility]   [phone] / [phone] Please see Amion for details.

## 2024-02-25 ENCOUNTER — Other Ambulatory Visit: Payer: Self-pay

## 2024-02-25 ENCOUNTER — Inpatient Hospital Stay

## 2024-02-25 DIAGNOSIS — E871 Hypo-osmolality and hyponatremia: Secondary | ICD-10-CM | POA: Diagnosis present

## 2024-02-25 DIAGNOSIS — G9341 Metabolic encephalopathy: Secondary | ICD-10-CM | POA: Diagnosis present

## 2024-02-25 DIAGNOSIS — F10139 Alcohol abuse with withdrawal, unspecified: Secondary | ICD-10-CM | POA: Diagnosis not present

## 2024-02-25 DIAGNOSIS — E872 Acidosis, unspecified: Secondary | ICD-10-CM | POA: Diagnosis present

## 2024-02-25 DIAGNOSIS — F10932 Alcohol use, unspecified with withdrawal with perceptual disturbance: Secondary | ICD-10-CM | POA: Diagnosis not present

## 2024-02-25 DIAGNOSIS — I4581 Long QT syndrome: Secondary | ICD-10-CM | POA: Diagnosis not present

## 2024-02-25 DIAGNOSIS — E860 Dehydration: Secondary | ICD-10-CM | POA: Diagnosis present

## 2024-02-25 DIAGNOSIS — E878 Other disorders of electrolyte and fluid balance, not elsewhere classified: Secondary | ICD-10-CM | POA: Diagnosis present

## 2024-02-25 DIAGNOSIS — Z7982 Long term (current) use of aspirin: Secondary | ICD-10-CM | POA: Diagnosis not present

## 2024-02-25 DIAGNOSIS — F1090 Alcohol use, unspecified, uncomplicated: Secondary | ICD-10-CM | POA: Diagnosis not present

## 2024-02-25 DIAGNOSIS — E876 Hypokalemia: Secondary | ICD-10-CM | POA: Diagnosis present

## 2024-02-25 DIAGNOSIS — F17290 Nicotine dependence, other tobacco product, uncomplicated: Secondary | ICD-10-CM | POA: Diagnosis present

## 2024-02-25 DIAGNOSIS — E8729 Other acidosis: Secondary | ICD-10-CM | POA: Diagnosis not present

## 2024-02-25 DIAGNOSIS — R197 Diarrhea, unspecified: Secondary | ICD-10-CM | POA: Diagnosis present

## 2024-02-25 DIAGNOSIS — K760 Fatty (change of) liver, not elsewhere classified: Secondary | ICD-10-CM | POA: Diagnosis present

## 2024-02-25 DIAGNOSIS — R1110 Vomiting, unspecified: Secondary | ICD-10-CM | POA: Diagnosis present

## 2024-02-25 DIAGNOSIS — Z79899 Other long term (current) drug therapy: Secondary | ICD-10-CM | POA: Diagnosis not present

## 2024-02-25 DIAGNOSIS — E861 Hypovolemia: Secondary | ICD-10-CM | POA: Diagnosis present

## 2024-02-25 LAB — URINALYSIS, ROUTINE W REFLEX MICROSCOPIC
Bacteria, UA: NONE SEEN
Bilirubin Urine: NEGATIVE
Glucose, UA: NEGATIVE mg/dL
Ketones, ur: 20 mg/dL — AB
Leukocytes,Ua: NEGATIVE
Nitrite: NEGATIVE
Protein, ur: NEGATIVE mg/dL
Specific Gravity, Urine: 1.006 (ref 1.005–1.030)
Squamous Epithelial / HPF: 0 /HPF (ref 0–5)
pH: 7 (ref 5.0–8.0)

## 2024-02-25 LAB — CBC WITH DIFFERENTIAL/PLATELET
Abs Immature Granulocytes: 0.01 10*3/uL (ref 0.00–0.07)
Abs Immature Granulocytes: 0.02 10*3/uL (ref 0.00–0.07)
Basophils Absolute: 0 10*3/uL (ref 0.0–0.1)
Basophils Absolute: 0 10*3/uL (ref 0.0–0.1)
Basophils Relative: 1 %
Basophils Relative: 1 %
Eosinophils Absolute: 0 10*3/uL (ref 0.0–0.5)
Eosinophils Absolute: 0 10*3/uL (ref 0.0–0.5)
Eosinophils Relative: 0 %
Eosinophils Relative: 1 %
Hemoglobin: 12.8 g/dL — ABNORMAL LOW (ref 13.0–17.0)
Hemoglobin: 15.6 g/dL (ref 13.0–17.0)
Immature Granulocytes: 0 %
Immature Granulocytes: 1 %
Lymphocytes Relative: 30 %
Lymphocytes Relative: 40 %
Lymphs Abs: 1.3 10*3/uL (ref 0.7–4.0)
Lymphs Abs: 1.5 10*3/uL (ref 0.7–4.0)
Monocytes Absolute: 0.3 10*3/uL (ref 0.1–1.0)
Monocytes Absolute: 0.5 10*3/uL (ref 0.1–1.0)
Monocytes Relative: 11 %
Monocytes Relative: 9 %
Neutro Abs: 1.9 10*3/uL (ref 1.7–7.7)
Neutro Abs: 2.4 10*3/uL (ref 1.7–7.7)
Neutrophils Relative %: 50 %
Neutrophils Relative %: 56 %
Platelets: 144 10*3/uL — ABNORMAL LOW (ref 150–400)
Platelets: 156 10*3/uL (ref 150–400)
WBC: 3.8 10*3/uL — ABNORMAL LOW (ref 4.0–10.5)
WBC: 4.2 10*3/uL (ref 4.0–10.5)

## 2024-02-25 LAB — CBC
Hemoglobin: 13 g/dL (ref 13.0–17.0)
Platelets: 143 10*3/uL — ABNORMAL LOW (ref 150–400)
WBC: 3.6 10*3/uL — ABNORMAL LOW (ref 4.0–10.5)

## 2024-02-25 LAB — OSMOLALITY, URINE: Osmolality, Ur: 195 mosm/kg — ABNORMAL LOW (ref 300–900)

## 2024-02-25 LAB — COMPREHENSIVE METABOLIC PANEL WITH GFR
ALT: 52 U/L — ABNORMAL HIGH (ref 0–44)
AST: 173 U/L — ABNORMAL HIGH (ref 15–41)
Albumin: 3.6 g/dL (ref 3.5–5.0)
Alkaline Phosphatase: 87 U/L (ref 38–126)
Anion gap: 18 — ABNORMAL HIGH (ref 5–15)
BUN: 5 mg/dL — ABNORMAL LOW (ref 6–20)
CO2: 26 mmol/L (ref 22–32)
Calcium: 8.4 mg/dL — ABNORMAL LOW (ref 8.9–10.3)
Chloride: 76 mmol/L — ABNORMAL LOW (ref 98–111)
Creatinine, Ser: 0.74 mg/dL (ref 0.61–1.24)
GFR, Estimated: >60 mL/min (ref >60)
Glucose, Bld: 80 mg/dL (ref 70–99)
Potassium: 3 mmol/L — ABNORMAL LOW (ref 3.5–5.1)
Sodium: 120 mmol/L — ABNORMAL LOW (ref 135–145)
Total Bilirubin: 3.2 mg/dL — ABNORMAL HIGH (ref 0.0–1.2)
Total Protein: 6.2 g/dL — ABNORMAL LOW (ref 6.5–8.1)

## 2024-02-25 LAB — RESP PANEL BY RT-PCR (RSV, FLU A&B, COVID)  RVPGX2
Influenza A by PCR: NEGATIVE
Influenza B by PCR: NEGATIVE
Resp Syncytial Virus by PCR: NEGATIVE
SARS Coronavirus 2 by RT PCR: NEGATIVE

## 2024-02-25 LAB — GLUCOSE, CAPILLARY
Glucose-Capillary: 98 mg/dL (ref 70–99)
Glucose-Capillary: 83 mg/dL (ref 70–99)
Glucose-Capillary: 73 mg/dL (ref 70–99)
Glucose-Capillary: 68 mg/dL — ABNORMAL LOW (ref 70–99)
Glucose-Capillary: 100 mg/dL — ABNORMAL HIGH (ref 70–99)
Glucose-Capillary: 128 mg/dL — ABNORMAL HIGH (ref 70–99)
Glucose-Capillary: 116 mg/dL — ABNORMAL HIGH (ref 70–99)

## 2024-02-25 LAB — URINE DRUG SCREEN, QUALITATIVE (ARMC ONLY)
Amphetamines, Ur Screen: NOT DETECTED
Barbiturates, Ur Screen: NOT DETECTED
Benzodiazepine, Ur Scrn: NOT DETECTED
Cannabinoid 50 Ng, Ur ~~LOC~~: NOT DETECTED
Cocaine Metabolite,Ur ~~LOC~~: NOT DETECTED
MDMA (Ecstasy)Ur Screen: NOT DETECTED
Methadone Scn, Ur: NOT DETECTED
Opiate, Ur Screen: NOT DETECTED
Phencyclidine (PCP) Ur S: NOT DETECTED
Tricyclic, Ur Screen: NOT DETECTED

## 2024-02-25 LAB — MAGNESIUM
Magnesium: 1.6 mg/dL — ABNORMAL LOW (ref 1.7–2.4)
Magnesium: 2.9 mg/dL — ABNORMAL HIGH (ref 1.7–2.4)

## 2024-02-25 LAB — SODIUM, URINE, RANDOM: Sodium, Ur: 22 mmol/L

## 2024-02-25 LAB — PHOSPHORUS
Phosphorus: 2.4 mg/dL — ABNORMAL LOW (ref 2.5–4.6)
Phosphorus: 2.6 mg/dL (ref 2.5–4.6)

## 2024-02-25 LAB — MRSA NEXT GEN BY PCR, NASAL: MRSA by PCR Next Gen: NOT DETECTED

## 2024-02-25 LAB — LACTIC ACID, PLASMA
Lactic Acid, Venous: 5.3 mmol/L (ref 0.5–1.9)
Lactic Acid, Venous: 3.2 mmol/L (ref 0.5–1.9)
Lactic Acid, Venous: 0.7 mmol/L (ref 0.5–1.9)

## 2024-02-25 LAB — PROTIME-INR
INR: 1 (ref 0.8–1.2)
Prothrombin Time: 13.1 s (ref 11.4–15.2)

## 2024-02-25 LAB — SODIUM
Sodium: 118 mmol/L — CL (ref 135–145)
Sodium: 121 mmol/L — ABNORMAL LOW (ref 135–145)
Sodium: 121 mmol/L — ABNORMAL LOW (ref 135–145)
Sodium: 123 mmol/L — ABNORMAL LOW (ref 135–145)
Sodium: 125 mmol/L — ABNORMAL LOW (ref 135–145)
Sodium: 126 mmol/L — ABNORMAL LOW (ref 135–145)
Sodium: 126 mmol/L — ABNORMAL LOW (ref 135–145)

## 2024-02-25 LAB — ETHANOL: Alcohol, Ethyl (B): <15 mg/dL (ref <15)

## 2024-02-25 LAB — BASIC METABOLIC PANEL WITH GFR
Anion gap: 14 (ref 5–15)
BUN: 7 mg/dL (ref 6–20)
CO2: 27 mmol/L (ref 22–32)
Calcium: 8.3 mg/dL — ABNORMAL LOW (ref 8.9–10.3)
Chloride: 84 mmol/L — ABNORMAL LOW (ref 98–111)
Creatinine, Ser: 0.81 mg/dL (ref 0.61–1.24)
GFR, Estimated: >60 mL/min (ref >60)
Glucose, Bld: 80 mg/dL (ref 70–99)
Potassium: 3.8 mmol/L (ref 3.5–5.1)
Sodium: 125 mmol/L — ABNORMAL LOW (ref 135–145)

## 2024-02-25 LAB — C DIFFICILE QUICK SCREEN W PCR REFLEX
C Diff antigen: NEGATIVE
C Diff interpretation: NOT DETECTED
C Diff toxin: NEGATIVE

## 2024-02-25 LAB — PROCALCITONIN
Procalcitonin: 0.69 ng/mL
Procalcitonin: 0.91 ng/mL

## 2024-02-25 LAB — OSMOLALITY: Osmolality: 260 mosm/kg — ABNORMAL LOW (ref 275–295)

## 2024-02-25 LAB — AMMONIA: Ammonia: 27 umol/L (ref 9–35)

## 2024-02-25 LAB — TROPONIN I (HIGH SENSITIVITY): Troponin I (High Sensitivity): 6 ng/L (ref <18)

## 2024-02-25 MED ORDER — DOCUSATE SODIUM 100 MG PO CAPS: 100.0000 mg | ORAL_CAPSULE | Freq: Two times a day (BID) | ORAL | Status: DC | PRN

## 2024-02-25 MED ORDER — POTASSIUM CHLORIDE 10 MEQ/100ML IV SOLN
10.0000 meq | INTRAVENOUS | Status: AC
  Administered 2024-02-25 (×4): 10 meq via INTRAVENOUS
  Filled 2024-02-25 (×4): qty 100

## 2024-02-25 MED ORDER — LORAZEPAM 1 MG PO TABS
0.0000 mg | ORAL_TABLET | ORAL | Status: DC
  Administered 2024-02-25: 2 mg via ORAL
  Filled 2024-02-25: qty 2

## 2024-02-25 MED ORDER — SODIUM CHLORIDE 0.9 % IV SOLN: INTRAVENOUS | Status: DC

## 2024-02-25 MED ORDER — POLYETHYLENE GLYCOL 3350 17 G PO PACK: 17.0000 g | PACK | Freq: Every day | ORAL | Status: DC | PRN

## 2024-02-25 MED ORDER — THIAMINE HCL 100 MG/ML IJ SOLN
500.0000 mg | INTRAVENOUS | Status: AC
  Administered 2024-02-25 – 2024-02-28 (×4): 500 mg via INTRAVENOUS
  Filled 2024-02-25 (×4): qty 5

## 2024-02-25 MED ORDER — DESMOPRESSIN ACETATE 4 MCG/ML IJ SOLN
4.0000 ug | Freq: Once | INTRAMUSCULAR | Status: AC
  Administered 2024-02-25: 4 ug via INTRAVENOUS
  Filled 2024-02-25: qty 1

## 2024-02-25 MED ORDER — PHENOBARBITAL SODIUM 130 MG/ML IJ SOLN
65.0000 mg | Freq: Three times a day (TID) | INTRAMUSCULAR | Status: DC
  Administered 2024-02-27 – 2024-02-29 (×5): 65 mg via INTRAVENOUS
  Filled 2024-02-25 (×5): qty 1

## 2024-02-25 MED ORDER — THIAMINE MONONITRATE 100 MG PO TABS
100.0000 mg | ORAL_TABLET | Freq: Every day | ORAL | Status: DC
  Administered 2024-02-29: 100 mg via ORAL
  Filled 2024-02-25: qty 1

## 2024-02-25 MED ORDER — PIPERACILLIN-TAZOBACTAM 3.375 G IVPB
3.3750 g | Freq: Three times a day (TID) | INTRAVENOUS | Status: DC
  Administered 2024-02-25: 3.375 g via INTRAVENOUS
  Filled 2024-02-25: qty 50

## 2024-02-25 MED ORDER — DEXTROSE 50 % IV SOLN
INTRAVENOUS | Status: AC
  Administered 2024-02-25: 25 mL
  Filled 2024-02-25: qty 50

## 2024-02-25 MED ORDER — SODIUM CHLORIDE 0.9 % IV SOLN: Freq: Once | INTRAVENOUS | Status: AC

## 2024-02-25 MED ORDER — POTASSIUM CHLORIDE 10 MEQ/100ML IV SOLN
10.0000 meq | INTRAVENOUS | Status: DC
  Filled 2024-02-25 (×4): qty 100

## 2024-02-25 MED ORDER — SODIUM CHLORIDE 0.9 % IV SOLN
260.0000 mg | Freq: Once | INTRAVENOUS | Status: AC
  Administered 2024-02-25: 260 mg via INTRAVENOUS
  Filled 2024-02-25: qty 2

## 2024-02-25 MED ORDER — ENOXAPARIN SODIUM 40 MG/0.4ML IJ SOSY
40.0000 mg | PREFILLED_SYRINGE | INTRAMUSCULAR | Status: DC
  Administered 2024-02-25 – 2024-02-29 (×5): 40 mg via SUBCUTANEOUS
  Filled 2024-02-25 (×5): qty 0.4

## 2024-02-25 MED ORDER — DESMOPRESSIN ACETATE 4 MCG/ML IJ SOLN
2.0000 ug | Freq: Once | INTRAMUSCULAR | Status: DC
  Filled 2024-02-25: qty 1

## 2024-02-25 MED ORDER — LORAZEPAM 2 MG/ML IJ SOLN
1.0000 mg | INTRAMUSCULAR | Status: DC | PRN
  Administered 2024-02-28: 1 mg via INTRAVENOUS
  Filled 2024-02-25: qty 1

## 2024-02-25 MED ORDER — POTASSIUM CHLORIDE 10 MEQ/100ML IV SOLN
10.0000 meq | INTRAVENOUS | Status: AC
  Administered 2024-02-25 (×3): 10 meq via INTRAVENOUS
  Filled 2024-02-25 (×3): qty 100

## 2024-02-25 MED ORDER — PHENOBARBITAL SODIUM 130 MG/ML IJ SOLN
97.5000 mg | Freq: Three times a day (TID) | INTRAMUSCULAR | Status: AC
  Administered 2024-02-25 – 2024-02-27 (×6): 97.5 mg via INTRAVENOUS
  Filled 2024-02-25 (×6): qty 1

## 2024-02-25 MED ORDER — MAGNESIUM SULFATE 4 GM/100ML IV SOLN
4.0000 g | Freq: Once | INTRAVENOUS | Status: AC
  Administered 2024-02-25: 4 g via INTRAVENOUS
  Filled 2024-02-25: qty 100

## 2024-02-25 MED ORDER — ORAL CARE MOUTH RINSE: 15.0000 mL | OROMUCOSAL | Status: DC | PRN

## 2024-02-25 MED ORDER — SODIUM CHLORIDE 3 % IV SOLN
INTRAVENOUS | Status: DC
Start: 2024-02-25 — End: 2024-02-25
  Filled 2024-02-25: qty 500

## 2024-02-25 MED ORDER — PHENOBARBITAL SODIUM 65 MG/ML IJ SOLN: 32.5000 mg | Freq: Three times a day (TID) | INTRAMUSCULAR | Status: DC

## 2024-02-25 MED ORDER — THIAMINE HCL 100 MG/ML IJ SOLN: 100.0000 mg | Freq: Every day | INTRAMUSCULAR | Status: DC

## 2024-02-25 MED ORDER — DESMOPRESSIN ACETATE 4 MCG/ML IJ SOLN
2.0000 ug | Freq: Once | INTRAMUSCULAR | Status: AC
  Administered 2024-02-25: 2 ug via INTRAVENOUS
  Filled 2024-02-25: qty 1

## 2024-02-25 MED ORDER — PNEUMOCOCCAL 20-VAL CONJ VACC 0.5 ML IM SUSY: 0.5000 mL | PREFILLED_SYRINGE | INTRAMUSCULAR | Status: DC

## 2024-02-25 MED ORDER — LORAZEPAM 1 MG PO TABS
0.0000 mg | ORAL_TABLET | Freq: Three times a day (TID) | ORAL | Status: DC
Start: 2024-02-27 — End: 2024-02-25

## 2024-02-25 MED ORDER — PANTOPRAZOLE SODIUM 40 MG IV SOLR
40.0000 mg | Freq: Two times a day (BID) | INTRAVENOUS | Status: DC
  Administered 2024-02-25 – 2024-02-27 (×6): 40 mg via INTRAVENOUS
  Filled 2024-02-25 (×6): qty 10

## 2024-02-25 MED ORDER — DEXTROSE 5 % IV SOLN: INTRAVENOUS | Status: DC

## 2024-02-25 NOTE — TOC Progression Note (Signed)
 Transition of Care [facility]) - Progression Note    Patient Details  Name: Nicholas Fletcher MRN: [id number] Date of Birth: [birth year]  Transition of Care [facility]) CM/SW Contact  Rommel Hogston A Sibbie Flammia, RN Phone Number: 02/25/2024, 11:51 AM  Clinical Narrative:    Chart reviewed.  Noted that patient was admitted with Acute Symptomatic Hypotonic Hyponatremia secondary to suspected polydipsia in the setting of ETOH abuse and GI losses Acute Encephalopathy.  Patient lethargic today.   I have added Substance abuse resources to AVS.    TOC will continue to follow for discharge planning.          Expected Discharge Plan and Services                                               Social Determinants of Health (SDOH) Interventions SDOH Screenings   Food Insecurity: No Food Insecurity (02/25/2024)  Housing: Low Risk  (02/25/2024)  Transportation Needs: No Transportation Needs (02/25/2024)  Utilities: Not At Risk (02/25/2024)  Alcohol Screen: Medium Risk (07/05/2023)  Depression (PHQ2-9): Medium Risk (09/18/2023)  Social Connections: Unknown (03/06/2022)   Received from [facility]  Tobacco Use: High Risk (02/24/2024)    Readmission Risk Interventions     No data to display

## 2024-02-25 NOTE — Plan of Care (Signed)

## 2024-02-25 NOTE — Progress Notes (Signed)
 eLink Physician-Brief Progress Note Patient Name: Nicholas Fletcher DOB: [birth year] MRN: [id number]   Date of Service  02/25/2024  HPI/Events of Note  48 year old male with a history of alcohol use complicated by previous withdrawal seizures who presents to the emergency department with acute symptomatic hyponatremia.  Patient is hypertensive but otherwise normal vitals.  Saturating 88% on room air.  Results show severe hyponatremia at 113 with numerous electrolyte disturbances, lactic acidosis, and low osmolality.  Radiographs reviewed.  eICU Interventions  Frequent sodium checks, holding fluid for the time being  CIWA protocol in place, multivitamins  DVT prophylaxis with enoxaparin  GI prophylaxis with therapeutic pantoprazole      Intervention Category Evaluation Type: New Patient Evaluation  Tiler Brandis 02/25/2024, 1:44 AM

## 2024-02-25 NOTE — Progress Notes (Signed)
 Pharmacy Antibiotic Note  JAKI DUDAK is a 48 y.o. male admitted on 02/24/2024 with pneumonia.  Pharmacy has been consulted for Zosyn dosing.  Plan: Zosyn 3.375g IV q8h (4 hour infusion).  Height: 6' (182.9 cm) Weight: 81.6 kg (180 lb) IBW/kg (Calculated) : 77.6  Temp (24hrs), Avg:98.6 F (37 C), Min:98.6 F (37 C), Max:98.6 F (37 C)  Recent Labs  Lab 02/24/24 2227 02/25/24 0019  WBC 3.8*  --   CREATININE 0.65  --   LATICACIDVEN  --  5.3*    Estimated Creatinine Clearance: 121.3 mL/min (by C-G formula based on SCr of 0.65 mg/dL).    No Known Allergies  Antimicrobials this admission:   >>    >>   Dose adjustments this admission:   Microbiology results:  BCx:   UCx:    Sputum:    MRSA PCR:   Thank you for allowing pharmacy to be a part of this patient's care.  Kavin Weckwerth D 02/25/2024 2:24 AM

## 2024-02-25 NOTE — Progress Notes (Addendum)
 PHARMACY CONSULT NOTE - FOLLOW UP  Pharmacy Consult for Electrolyte Monitoring and Replacement ;  Hypertonic saline monitoring  Recent Labs: Potassium (mmol/L)  Date Value  02/24/2024 2.8 (L)   Magnesium  (mg/dL)  Date Value  08/65/7846 1.6 (L)   Calcium (mg/dL)  Date Value  96/29/5284 8.8 (L)   Albumin (g/dL)  Date Value  13/24/4010 4.2   Phosphorus (mg/dL)  Date Value  27/25/3664 2.4 (L)   Sodium (mmol/L)  Date Value  02/25/2024 118 (LL)     Assessment: Pharmacy consulted to manage electrolytes and monitor Na in this 48 yo male presenting with acute hyponatremia.   5/5:  Na @ 2227 = 113 (baseline) - 0.9% NaCl 1 L given @ 2349 - 3 % NaCl 150 mL X 1 given 5/6 @ 0025  5/6: Na @ 0058 = 118 (5 mEq jump in 2.5 hrs) 5/6: Na @ 0306 = 120 (7 mEq jump in 4.5 hrs)   Goal of Therapy:  - electrolytes WNL  - Pharmacy will contact provider if:    - Na rises > 4 mEq in 2 hrs    - Na rises > 6 mEq in 4 hrs    - Na rises > 12 mEq in 24 hrs  Plan:  5/6:  Na @ 0306 = 120 (7 mEq increase in 4.5 hrs) - current increase is just within acceptable range.  Will continue to hold further NaCl and recheck @ 0700.  Alvenia Job ,PharmD Clinical Pharmacist 02/25/2024 1:43 AM

## 2024-02-25 NOTE — Discharge Instructions (Addendum)
 Intensive Outpatient Programs   [facility] [address] [place],  [place], [place] [phone]  [name] (Inpatient and outpatient)[phone] (Suboxone and Methadone) [address] [phone]  [facility]: Alcohol & Drug [facility] Programs - Intensive Outpatient [address] [place], [place] [address], [place]  [phone]  [facility] (Outpatient, Inpatient, [facility] (Groups and Residental) (insurance only) [phone] [place], [place] [phone]   [facility] (for caregivers and family) [address] [name] [address] [place], [place] [phone]  Residential Treatment Programs  [facility](2 years) Residential: 78 days)[facility] ([facility] [address] [place], [place], [place] [phone] or [phone]  [facility] [address] [place], [place], [place] [phone]  [facility] ([facility]) [address] [place], [place], [place] [phone] Admissions: 8am-3pm M-F  [facility] Program: Residential Program [phone] Days)             [facility]: [place]  [facility] [place], [place], [place]  [phone] or [phone] in Hours over the weekend or by referral)  [facility] [address] [place], [place] [address] [phone] (Do virtual or phone assessment, offer transportation within 25 miles, have in patient and Outpatient options)   Mobil Crisis: [facility]:[phone] (for crisis  response 24 hours a day)    Outpatient Substance Use Treatment Services   [facility] Intensive Outpatient Program [address] [place], [place] [address]  [phone] Private insurance, Medicare A&B, and [facility]   [facility] (Alcohol and Drug Services)  [address],  [place], [place] [address] [phone] Medicaid, Self Pay   [facility]                                           [name]  [place], [place] [phone] Medicaid and [facility], Self Pay   [facility] [address]  [place], [place]  [phone] [facility], and Self Pay  [facility]                                                     [address]                      [place], [place] [address]  [phone] or [phone] Private Insurance Only                 [facility] [address]  [name], [address]  [phone] Medicaid, Medicare, Private Insurance  [facility] Services at [facility] [address]  [place],  [address] [phone] Services are free or reduced  [facility]  [address] [phone]  Self Pay only, sliding scale  [facility]  [address]  [place], [place] [address] [phone] ([facility]) Self Pay, Medicaid Only   [facility] [address]  [place],  [place] [address] [phone] Medicaid, Medicare, Private Insurance

## 2024-02-25 NOTE — Plan of Care (Addendum)
 Patient is admitted to AR-ICU overnight. The patient is A+Ox2; speech slurred; ICDSC (+). PIVs X 2. Enteric isolation pending GI pathogen panel. External urinary containment device for urine incontinence. No supplemental O2 requirement. CIWA Q 4 hours.   Problem: Education: Goal: Knowledge of General Education information will improve Description: Including pain rating scale, medication(s)/side effects and non-pharmacologic comfort measures Outcome: Progressing   Problem: Health Behavior/Discharge Planning: Goal: Ability to manage health-related needs will improve Outcome: Progressing   Problem: Clinical Measurements: Goal: Ability to maintain clinical measurements within normal limits will improve Outcome: Progressing Goal: Will remain free from infection Outcome: Progressing Goal: Diagnostic test results will improve Outcome: Progressing Goal: Respiratory complications will improve Outcome: Progressing Goal: Cardiovascular complication will be avoided Outcome: Progressing   Problem: Activity: Goal: Risk for activity intolerance will decrease Outcome: Progressing   Problem: Nutrition: Goal: Adequate nutrition will be maintained Outcome: Progressing   Problem: Coping: Goal: Level of anxiety will decrease Outcome: Progressing   Problem: Elimination: Goal: Will not experience complications related to bowel motility Outcome: Progressing Goal: Will not experience complications related to urinary retention Outcome: Progressing   Problem: Pain Managment: Goal: General experience of comfort will improve and/or be controlled Outcome: Progressing   Problem: Safety: Goal: Ability to remain free from injury will improve Outcome: Progressing   Problem: Skin Integrity: Goal: Risk for impaired skin integrity will decrease Outcome: Progressing

## 2024-02-25 NOTE — ED Notes (Signed)
 CCMD called to initiate cardiac monitoring

## 2024-02-26 ENCOUNTER — Other Ambulatory Visit: Payer: Self-pay

## 2024-02-26 DIAGNOSIS — F1090 Alcohol use, unspecified, uncomplicated: Secondary | ICD-10-CM | POA: Diagnosis not present

## 2024-02-26 DIAGNOSIS — E871 Hypo-osmolality and hyponatremia: Secondary | ICD-10-CM | POA: Diagnosis not present

## 2024-02-26 LAB — CBC
HCT: 31.2 % — ABNORMAL LOW (ref 39.0–52.0)
Hemoglobin: 11.4 g/dL — ABNORMAL LOW (ref 13.0–17.0)
MCH: 35.4 pg — ABNORMAL HIGH (ref 26.0–34.0)
MCHC: 36.5 g/dL — ABNORMAL HIGH (ref 30.0–36.0)
MCV: 96.9 fL (ref 80.0–100.0)
Platelets: 109 10*3/uL — ABNORMAL LOW (ref 150–400)
RBC: 3.22 MIL/uL — ABNORMAL LOW (ref 4.22–5.81)
RDW: 10.6 % — ABNORMAL LOW (ref 11.5–15.5)
WBC: 2.6 10*3/uL — ABNORMAL LOW (ref 4.0–10.5)
nRBC: 0 % (ref 0.0–0.2)

## 2024-02-26 LAB — GASTROINTESTINAL PANEL BY PCR, STOOL (REPLACES STOOL CULTURE)

## 2024-02-26 LAB — BASIC METABOLIC PANEL WITH GFR
Anion gap: 7 (ref 5–15)
BUN: 7 mg/dL (ref 6–20)
CO2: 25 mmol/L (ref 22–32)
Calcium: 8.2 mg/dL — ABNORMAL LOW (ref 8.9–10.3)
Chloride: 88 mmol/L — ABNORMAL LOW (ref 98–111)
Creatinine, Ser: 0.79 mg/dL (ref 0.61–1.24)
GFR, Estimated: >60 mL/min (ref >60)
Glucose, Bld: 101 mg/dL — ABNORMAL HIGH (ref 70–99)
Potassium: 3.1 mmol/L — ABNORMAL LOW (ref 3.5–5.1)
Sodium: 120 mmol/L — ABNORMAL LOW (ref 135–145)

## 2024-02-26 LAB — SODIUM
Sodium: 120 mmol/L — ABNORMAL LOW (ref 135–145)
Sodium: 120 mmol/L — ABNORMAL LOW (ref 135–145)
Sodium: 120 mmol/L — ABNORMAL LOW (ref 135–145)
Sodium: 117 mmol/L — CL (ref 135–145)
Sodium: 119 mmol/L — CL (ref 135–145)
Sodium: 118 mmol/L — CL (ref 135–145)
Sodium: 120 mmol/L — ABNORMAL LOW (ref 135–145)

## 2024-02-26 LAB — GLUCOSE, CAPILLARY
Glucose-Capillary: 103 mg/dL — ABNORMAL HIGH (ref 70–99)
Glucose-Capillary: 108 mg/dL — ABNORMAL HIGH (ref 70–99)
Glucose-Capillary: 142 mg/dL — ABNORMAL HIGH (ref 70–99)
Glucose-Capillary: 117 mg/dL — ABNORMAL HIGH (ref 70–99)
Glucose-Capillary: 101 mg/dL — ABNORMAL HIGH (ref 70–99)
Glucose-Capillary: 104 mg/dL — ABNORMAL HIGH (ref 70–99)

## 2024-02-26 LAB — PHOSPHORUS: Phosphorus: 1.7 mg/dL — ABNORMAL LOW (ref 2.5–4.6)

## 2024-02-26 LAB — MAGNESIUM: Magnesium: 1.6 mg/dL — ABNORMAL LOW (ref 1.7–2.4)

## 2024-02-26 LAB — OSMOLALITY, URINE: Osmolality, Ur: 492 mosm/kg (ref 300–900)

## 2024-02-26 LAB — PROCALCITONIN: Procalcitonin: 0.63 ng/mL

## 2024-02-26 LAB — SODIUM, URINE, RANDOM: Sodium, Ur: 201 mmol/L

## 2024-02-26 LAB — OSMOLALITY: Osmolality: 241 mosm/kg — CL (ref 275–295)

## 2024-02-26 MED ORDER — POTASSIUM PHOSPHATES 15 MMOLE/5ML IV SOLN
30.0000 mmol | Freq: Once | INTRAVENOUS | Status: AC
  Administered 2024-02-26: 30 mmol via INTRAVENOUS
  Filled 2024-02-26: qty 10

## 2024-02-26 MED ORDER — MAGNESIUM SULFATE 4 GM/100ML IV SOLN
4.0000 g | Freq: Once | INTRAVENOUS | Status: AC
  Administered 2024-02-26: 4 g via INTRAVENOUS
  Filled 2024-02-26: qty 100

## 2024-02-26 MED ORDER — ACETAMINOPHEN 325 MG PO TABS
650.0000 mg | ORAL_TABLET | Freq: Once | ORAL | Status: AC
  Administered 2024-02-26: 650 mg via ORAL
  Filled 2024-02-26: qty 2

## 2024-02-26 MED ORDER — SODIUM CHLORIDE 0.9 % IV SOLN: INTRAVENOUS | Status: DC

## 2024-02-26 MED ORDER — SODIUM CHLORIDE 3 % IV SOLN
INTRAVENOUS | Status: DC
  Filled 2024-02-26 (×2): qty 500

## 2024-02-26 MED ORDER — SODIUM CHLORIDE 3% (HYPERTONIC SALINE) BOLUS VIA INFUSION
50.0000 mL | Freq: Once | INTRAVENOUS | Status: AC
  Administered 2024-02-26: 50 mL via INTRAVENOUS
  Filled 2024-02-26: qty 50

## 2024-02-26 MED ORDER — SODIUM CHLORIDE 3 % IV SOLN
INTRAVENOUS | Status: DC
  Filled 2024-02-26: qty 500

## 2024-02-26 MED ORDER — ONDANSETRON HCL 4 MG/2ML IJ SOLN
INTRAMUSCULAR | Status: AC
  Filled 2024-02-26: qty 2

## 2024-02-26 MED ORDER — SODIUM CHLORIDE 3 % IV BOLUS
100.0000 mL | Freq: Once | INTRAVENOUS | Status: AC
  Administered 2024-02-26: 100 mL via INTRAVENOUS
  Filled 2024-02-26: qty 500

## 2024-02-26 MED ORDER — SODIUM CHLORIDE 0.9 % IV BOLUS
500.0000 mL | Freq: Once | INTRAVENOUS | Status: AC
  Administered 2024-02-26: 500 mL via INTRAVENOUS

## 2024-02-26 MED ORDER — ONDANSETRON HCL 4 MG/2ML IJ SOLN
4.0000 mg | Freq: Once | INTRAMUSCULAR | Status: AC
  Administered 2024-02-26: 4 mg via INTRAVENOUS

## 2024-02-26 MED ORDER — CHLORHEXIDINE GLUCONATE CLOTH 2 % EX PADS
6.0000 | MEDICATED_PAD | Freq: Every day | CUTANEOUS | Status: DC
  Administered 2024-02-27 – 2024-02-28 (×2): 6 via TOPICAL

## 2024-02-26 MED ORDER — POTASSIUM CHLORIDE 10 MEQ/100ML IV SOLN
10.0000 meq | INTRAVENOUS | Status: AC
  Administered 2024-02-26 (×2): 10 meq via INTRAVENOUS
  Filled 2024-02-26 (×2): qty 100

## 2024-02-26 MED ORDER — ENSURE ENLIVE PO LIQD
237.0000 mL | Freq: Three times a day (TID) | ORAL | Status: DC
  Administered 2024-02-27 – 2024-02-29 (×3): 237 mL via ORAL

## 2024-02-26 NOTE — Consult Note (Signed)
 PHARMACY CONSULT NOTE - ELECTROLYTES  Pharmacy Consult for Electrolyte Monitoring and Replacement   Recent Labs: Potassium (mmol/L)  Date Value  02/26/2024 3.1 (L)   Magnesium  (mg/dL)  Date Value  78/29/5621 1.6 (L)   Calcium (mg/dL)  Date Value  30/86/5784 8.2 (L)   Albumin (g/dL)  Date Value  69/62/9528 3.6   Phosphorus (mg/dL)  Date Value  41/32/4401 1.7 (L)   Sodium (mmol/L)  Date Value  02/26/2024 120 (L)   Height: 6' (182.9 cm) Weight: 86.6 kg (190 lb 14.7 oz) IBW/kg (Calculated) : 77.6 Estimated Creatinine Clearance: 121.3 mL/min (by C-G formula based on SCr of 0.79 mg/dL).  Assessment  Nicholas Fletcher is a 48 y.o. male presenting with hyponatremia. PMH significant for alcohol use disorder, withdrawal seizures, GIB, severe esophagitis, diverticulitis. Pharmacy has been consulted to monitor and replace electrolytes.  Diet: Regular MIVF: NS @ 100 mL/hr Pertinent medications: N/A  Goal of Therapy: Electrolytes within normal limits  Plan:  Na 120, correcting at appropriate rate. Continue fluids as above K 3.1, Kcl 10 mEq IV q1h x 2 (will receive an additional ~45 mEq K+ from potassium phosphate) Phos 1.7, potassium phosphate 30 mmol IV x 1 Mg 1.6, magnesium  sulfate 4 g IV x 1 Na checks currently ordered q2h, check BMP, Mg, and Phos tomorrow AM  Thank you for allowing pharmacy to be a part of this patient's care.  Page Boast 02/26/2024 7:57 AM

## 2024-02-26 NOTE — Plan of Care (Signed)

## 2024-02-26 NOTE — Progress Notes (Signed)
 NAME:  Nicholas Fletcher, MRN:  [id number], DOB:  May 07, 1976, LOS: 1 ADMISSION DATE:  02/24/2024  History of Present Illness:  48 yo M presenting to [facility] ED from home for evaluation of altered mental status.   History obtained per chart review and patient/spouse bedside report. On 02/22/24 patient thought he had a "cold or allergies". They reported nausea/ vomiting and diarrhea with fatigue, headache, chills and productive cough. Over the last few days they also noticed poor food intake and that the patient was always thirsty. Spouse reports he drank "about a gallon of water" over the last 24 hours. He endorses dizziness, muscle cramping, tremors. They deny seizure like activity, blurred vision, falls/trauma, fevers, or congestion. His wife also noticed on 02/24/24 that the patient starting having confused, slow and stuttering speech. During this he has continued to drink alcohol. He normally drinks 4-6 shots of liquor daily. On 5/3 he had 4 shots, on 5/4 he had 2 shots and only one beer on 5/5 around noon.   Of note patient had been admitted multiple times in 2023 & 2024 for hyponatremia and ETOH withdrawal with seizure activity. He was seen outpatient by a neurologist in 08/2023 and underwent an EEG, sleep EEG and MRI brain. He was not started on any AED. He was started on Lamictal  in 08/2023 by a psychiatrist for mood stability which he reports no longer taking. He denies recreational drug use, but does report daily vaping. 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   ED course: Upon arrival patient alert but altered with stable vitals. Labs significant for severe hyponatremia, hypokalemia, hypochloremia, chronic appearing Transaminitis, hypomagnesemia, lactic acidosis, elevated PCT, hypo-osmality. Imaging unremarkable.  Medications given: 1 L NS bolus and 150 mL bolus of 3% NaCL, 20 meq of K+ supplementation Initial Vitals: 98.6,, 18, 97, 145/94 & 100% on RA Significant labs: (Labs/  Imaging personally reviewed) I, Recardo Canal Rust[place]Chester, AGACNP-BC, personally viewed and interpreted this ECG. EKG Interpretation: Date: 02/24/24, EKG Time: 22:26, Rate: 89, Rhythm: NSR, QRS Axis:  normal, Intervals: prolonged Qtc, ST/T Wave abnormalities: none, Narrative Interpretation: NSR with prolonged Qtc Chemistry: Na+:113, K+: 2.8, BUN/Cr.: 5/ 0.65, Serum CO2/ AG: 22/24, AST/ALT: 235/ 66, Cl: 67 Hematology: WBC: recollect in process , Hgb: recollect in process,  Troponin: 4, Lactic/ PCT: 5.4/ 0.69, COVID-19 & Influenza A/B: pending   CXR 02/24/24: no active cardiopulmonary disease CT head wo contrast 02/24/24: no acute intracranial abnormality   PCCM consulted for admission due to acute symptomatic hypotonic hyponatremia in the setting of ETOH abuse and 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 Possible sepsis s/t unknown etiology. Na overcorrectd to 126 requiring multiple doses of Desmopressin  and starting D5W.  02/26/2024: Sodium at goal of 120. Complaining of blurry vision nausea and vomiting, Hypert saline 100cc bolus will be given. D5 and NS discontinued.   Interim History / Subjective:   Sodium at goal of 120. Complaining of blurry vision nausea and vomiting, Hypert saline 100cc bolus will be given. D5 and NS discontinued.   Objective   Blood pressure (!) 151/103, pulse 63, temperature (!) 97.3 F (36.3 C), temperature source Oral, resp.  rate 15, height 6' (1.829 m), weight 86.6 kg, SpO2 98%.        Intake/Output Summary (Last 24 hours) at 02/26/2024 1139 Last data filed at 02/26/2024  0500 Gross per 24 hour  Intake 976.42 ml  Output 1850 ml  Net -873.58 ml   Filed Weights   02/24/24 2219 02/25/24 0400 02/26/24 0500  Weight: 81.6 kg 81.6 kg 86.6 kg    Examination: General: NAD HENT: Supple neck, reactive pupils, EOMI Lungs: Clear bilateral air entry  Cardiovascular: Normal S1, Normal S2, RRR Abdomen: Soft, non tender, non distended.  Extremities: Warm and well perfused no edema.   Labs and imaging were reviewed.   Assessment & Plan:  Case of a 48 year old male patient with a past medical history of alcohol use disorder presenting with severe hyponatremia.  # Symptomatic hypoosmolar hyponatremia secondary to dehydration, decreased salt intake and alcohol use. # Alcohol use disorder with history of withdrawal seizures.  # Electrolyte derangement including hypokalemia hypomagnesemia and hypophosphatemia repleting aggressively  Neuro: 100 cc 3% hypertonic saline bolus.  CIWA protocol.  Phenobarb taper. CVS: Maintain MAP greater than 65. Pulmonary: No issues GI: As needed Zofran .  Regular diet. Renal: Sodium every 4 hours.  Hypertonic saline as above.  Goal is sodium 128 x 10 p.m. tonight.  Replete potassium magnesium  phosphorus aggressively. Heme: Lovenox  for DVT prophylaxis. Endo POC 140-180.  Best Practice (right click and "Reselect all SmartList Selections" daily)   Diet/type: Regular consistency (see orders) DVT prophylaxis LMWH Pressure ulcer(s): N/A GI prophylaxis: N/A Lines: N/A Foley:  N/A Code Status:  full code  Last date of multidisciplinary goals of care discussion [02/26/2024]  Critical care time: 40 minutes     Annitta Kindler, MD [facility] 02/26/2024 11:47 AM

## 2024-02-26 NOTE — Progress Notes (Signed)
 Peripherally Inserted Central Catheter Placement  The IV Nurse has discussed with the patient and/or persons authorized to consent for the patient, the purpose of this procedure and the potential benefits and risks involved with this procedure.  The benefits include less needle sticks, lab draws from the catheter, and the patient may be discharged home with the catheter. Risks include, but not limited to, infection, bleeding, blood clot (thrombus formation), and puncture of an artery; nerve damage and irregular heartbeat and possibility to perform a PICC exchange if needed/ordered by physician.  Alternatives to this procedure were also discussed.  Bard Power PICC patient education guide, fact sheet on infection prevention and patient information card has been provided to patient /or left at bedside. Consent signed by wife at bedside.   PICC Placement Documentation  PICC Double Lumen 02/26/24 Right Basilic 39 cm 0 cm (Active)  Indication for Insertion or Continuance of Line Administration of hyperosmolar/irritating solutions (i.e. TPN, Vancomycin, etc.) 02/26/24 1825  Exposed Catheter (cm) 0 cm 02/26/24 1825  Site Assessment Clean, Dry, Intact 02/26/24 1825  Lumen #1 Status Saline locked;Blood return noted 02/26/24 1825  Lumen #2 Status Saline locked;Blood return noted 02/26/24 1825  Dressing Type Transparent;Securing device 02/26/24 1825  Dressing Status Antimicrobial disc/dressing in place;Clean, Dry, Intact 02/26/24 1825  Line Care Connections checked and tightened 02/26/24 1825  Line Adjustment (NICU/IV Team Only) No 02/26/24 1825  Dressing Intervention Adhesive placed at insertion site (IV team only);New dressing 02/26/24 1825  Dressing Change Due 03/04/24 02/26/24 1825       Lulu Sales Chenice 02/26/2024, 6:38 PM

## 2024-02-26 NOTE — Consult Note (Signed)
 PHARMACY CONSULT NOTE - Hypertonic Saline  Pharmacy Consult for Hypertonic Saline Monitoring and Management  Recent Labs: Potassium (mmol/L)  Date Value  02/26/2024 3.1 (L)   Magnesium  (mg/dL)  Date Value  78/29/5621 1.6 (L)   Calcium (mg/dL)  Date Value  30/86/5784 8.2 (L)   Albumin (g/dL)  Date Value  69/62/9528 3.6   Phosphorus (mg/dL)  Date Value  41/32/4401 1.7 (L)   Sodium (mmol/L)  Date Value  02/26/2024 119 (LL)    Assessment  Nicholas Fletcher is a 48 y.o. male presenting with altered mental status, hyponatremia and nausea/vomiting. PMH significant forhepatic steatosis, etoh abuse, esophagitis, diverticulosis, anxiety and seizures. Pharmacy has been consulted to monitor hypertonic saline (3%) infusion.  Pertinent medications: N/A  Baseline Labs: Serum Na 119, Serum Osm 260, Urine Na 22, Urine Osm 195  Goal of Therapy:  Increase in Na by 4-6 mEq/L in 4-6 hours Do not exceed increase in Na by 10-12 mEq/L in 24 hours  Monitoring:  Date Time Na Rate/Comment  5/7 1546 119 Baseline(HTS started at 1843) 5/7 2009 118 92ml/hr  Plan:  Increase 3% saline to 30 mL/hr Check Na Q2H x2 then Q4H  Stop infusion if  Na increases > 4 mEq/L in first 2 hours Na increases > 6 mEq/L in first 4 hours Na increases > 6 mEq/L in 6 hours  Na increases > 8 mEq/L in 8 hours (give D5W bolus) Continue to monitor for signs of clinical improvement and recommendations per nephrology  Cristal Don, PharmD Clinical Pharmacist 02/26/2024 9:12 PM

## 2024-02-26 NOTE — Progress Notes (Signed)
 Initial Nutrition Assessment  DOCUMENTATION CODES:   Not applicable  INTERVENTION:   Ensure Enlive po TID, each supplement provides 350 kcal and 20 grams of protein.  MVI, thiamine  and folic acid  po daily   Pt at high refeed risk; recommend monitor potassium, magnesium  and phosphorus labs daily until stable  Daily weights   Check B12 and folate labs  NUTRITION DIAGNOSIS:   Inadequate oral intake related to acute illness as evidenced by per patient/family report.  GOAL:   Patient will meet greater than or equal to 90% of their needs  MONITOR:   PO intake, Supplement acceptance, Labs, Weight trends, I & O's, Skin  REASON FOR ASSESSMENT:   Malnutrition Screening Tool    ASSESSMENT:   48 y/o male with h/o hepatic steatosis, etoh abuse, esophagitis, diverticulosis, anxiety and seizures who is admitted with AMS, hyponatremia and nausea/vomiting.  Met with pt in room today. Pt reports that he is not feeling well today; pt complaining of nausea, numbness in his hands, dizziness, headache and blurred vision. Pt reports that he vomited this morning. Pt reports poor oral intake for several days pta. Pt reports that he well at dinner last night but did not eat much breakfast secondary to nausea. Pt with significant hyponatremia today and receiving hypertonic saline. Pt is actively refeeding; electrolytes being monitored and supplemented. RD discussed with pt the importance of adequate nutrition needed to preserve lean muscle. Pt is agreeable to drinking Ensure once he is feeling better. May need to consider post pyloric NGT placement and nutrition support if patients remains unable to keep food down. Per chart, pt appears fairly weight stable pta; pt up ~10lbs since admission. Pt -2.6L on his I & Os.   Medications reviewed and include: lovenox , folic acid , MVI, protonix , thiamine , Kphos, thiamine    Labs reviewed: Na 117(L), K 3.1(L), P 1.7(L), Mg 1.6(L) Wbc- 2.6(L), Hgb 11.4(L), Hct  31.2(L), MCH 35.4(H), MCHC 36.5(H) Cbgs- 142, 108, 103 x 24 hrs   UOP-   NUTRITION - FOCUSED PHYSICAL EXAM:  Flowsheet Row Most Recent Value  Orbital Region No depletion  Upper Arm Region Moderate depletion  Thoracic and Lumbar Region No depletion  Buccal Region No depletion  Temple Region No depletion  Clavicle Bone Region Moderate depletion  Clavicle and Acromion Bone Region Moderate depletion  Scapular Bone Region Mild depletion  Dorsal Hand Mild depletion  Patellar Region Moderate depletion  Anterior Thigh Region Mild depletion  Posterior Calf Region Mild depletion  Edema (RD Assessment) None  Hair Reviewed  Eyes Reviewed  Mouth Reviewed  Skin Reviewed  Nails Reviewed   Diet Order:   Diet Order             Diet regular Room service appropriate? Yes; Fluid consistency: Thin  Diet effective now                  EDUCATION NEEDS:   Education needs have been addressed  Skin:  Skin Assessment: Reviewed RN Assessment  Last BM:  5/5  Height:   Ht Readings from Last 1 Encounters:  02/24/24 6' (1.829 m)    Weight:   Wt Readings from Last 1 Encounters:  02/26/24 86.6 kg    Ideal Body Weight:  80.9 kg  BMI:  Body mass index is 25.89 kg/m.  Estimated Nutritional Needs:   Kcal:  2300-2600kcal/day  Protein:  115-130g/day  Fluid:  2.4-2.7L/day  Torrance Freestone MS, RD, LDN If unable to be reached, please send secure chat to "RD  inpatient" available from 8:00a-4:00p daily

## 2024-02-26 NOTE — Consult Note (Signed)
 PHARMACY CONSULT NOTE - Hypertonic Saline  Pharmacy Consult for Hypertonic Saline Monitoring and Management  Recent Labs: Potassium (mmol/L)  Date Value  02/26/2024 3.1 (L)   Magnesium  (mg/dL)  Date Value  60/45/4098 1.6 (L)   Calcium (mg/dL)  Date Value  11/91/4782 8.2 (L)   Albumin (g/dL)  Date Value  95/62/1308 3.6   Phosphorus (mg/dL)  Date Value  65/78/4696 1.7 (L)   Sodium (mmol/L)  Date Value  02/26/2024 120 (L)    Assessment  Nicholas Fletcher is a 48 y.o. male presenting with altered mental status, hyponatremia and nausea/vomiting. PMH significant forhepatic steatosis, etoh abuse, esophagitis, diverticulosis, anxiety and seizures. Pharmacy has been consulted to monitor hypertonic saline (3%) infusion.  Pertinent medications: N/A  Baseline Labs: Serum Na 119, Serum Osm 260, Urine Na 22, Urine Osm 195  Goal of Therapy:  Increase in Na by 4-6 mEq/L in 4-6 hours Do not exceed increase in Na by 10-12 mEq/L in 24 hours  Monitoring:  Date Time Na Rate/Comment  5/7 1546 119 Baseline(HTS started at 1843) 5/7 2009 118 86ml/hr 5/7       2244    120     20 ml/hr  Plan:  Increase 3% saline to 30 mL/hr Check Na Q2H x2 then Q4H  Stop infusion if  Na increases > 4 mEq/L in first 2 hours Na increases > 6 mEq/L in first 4 hours Na increases > 6 mEq/L in 6 hours  Na increases > 8 mEq/L in 8 hours (give D5W bolus) Continue to monitor for signs of clinical improvement and recommendations per nephrology  Nicholas Fletcher D, PharmD Clinical Pharmacist 02/26/2024 11:22 PM

## 2024-02-27 DIAGNOSIS — E871 Hypo-osmolality and hyponatremia: Secondary | ICD-10-CM | POA: Diagnosis not present

## 2024-02-27 DIAGNOSIS — F1090 Alcohol use, unspecified, uncomplicated: Secondary | ICD-10-CM | POA: Diagnosis not present

## 2024-02-27 LAB — BASIC METABOLIC PANEL WITH GFR
Anion gap: 8 (ref 5–15)
BUN: <5 mg/dL — ABNORMAL LOW (ref 6–20)
CO2: 27 mmol/L (ref 22–32)
Calcium: 8.2 mg/dL — ABNORMAL LOW (ref 8.9–10.3)
Chloride: 90 mmol/L — ABNORMAL LOW (ref 98–111)
Creatinine, Ser: 0.55 mg/dL — ABNORMAL LOW (ref 0.61–1.24)
GFR, Estimated: >60 mL/min (ref >60)
Glucose, Bld: 118 mg/dL — ABNORMAL HIGH (ref 70–99)
Potassium: 3.1 mmol/L — ABNORMAL LOW (ref 3.5–5.1)
Sodium: 123 mmol/L — ABNORMAL LOW (ref 135–145)

## 2024-02-27 LAB — SODIUM
Sodium: 123 mmol/L — ABNORMAL LOW (ref 135–145)
Sodium: 129 mmol/L — ABNORMAL LOW (ref 135–145)
Sodium: 129 mmol/L — ABNORMAL LOW (ref 135–145)
Sodium: 131 mmol/L — ABNORMAL LOW (ref 135–145)
Sodium: 129 mmol/L — ABNORMAL LOW (ref 135–145)

## 2024-02-27 LAB — PHOSPHORUS: Phosphorus: 2.8 mg/dL (ref 2.5–4.6)

## 2024-02-27 LAB — GLUCOSE, CAPILLARY
Glucose-Capillary: 109 mg/dL — ABNORMAL HIGH (ref 70–99)
Glucose-Capillary: 96 mg/dL (ref 70–99)
Glucose-Capillary: 110 mg/dL — ABNORMAL HIGH (ref 70–99)
Glucose-Capillary: 115 mg/dL — ABNORMAL HIGH (ref 70–99)
Glucose-Capillary: 92 mg/dL (ref 70–99)
Glucose-Capillary: 112 mg/dL — ABNORMAL HIGH (ref 70–99)

## 2024-02-27 LAB — VITAMIN B12: Vitamin B-12: 281 pg/mL (ref 180–914)

## 2024-02-27 LAB — CBC
HCT: 31.2 % — ABNORMAL LOW (ref 39.0–52.0)
Hemoglobin: 11.7 g/dL — ABNORMAL LOW (ref 13.0–17.0)
MCH: 35.9 pg — ABNORMAL HIGH (ref 26.0–34.0)
MCHC: 37.5 g/dL — ABNORMAL HIGH (ref 30.0–36.0)
MCV: 95.7 fL (ref 80.0–100.0)
Platelets: 126 10*3/uL — ABNORMAL LOW (ref 150–400)
RBC: 3.26 MIL/uL — ABNORMAL LOW (ref 4.22–5.81)
RDW: 10.2 % — ABNORMAL LOW (ref 11.5–15.5)
WBC: 3.8 10*3/uL — ABNORMAL LOW (ref 4.0–10.5)
nRBC: 0 % (ref 0.0–0.2)

## 2024-02-27 LAB — MAGNESIUM: Magnesium: 1.8 mg/dL (ref 1.7–2.4)

## 2024-02-27 LAB — FOLATE: Folate: 9.4 ng/mL (ref >5.9)

## 2024-02-27 MED ORDER — POTASSIUM CHLORIDE CRYS ER 20 MEQ PO TBCR
40.0000 meq | EXTENDED_RELEASE_TABLET | ORAL | Status: AC
  Administered 2024-02-27 (×2): 40 meq via ORAL
  Filled 2024-02-27 (×2): qty 2

## 2024-02-27 MED ORDER — SODIUM CHLORIDE 1 G PO TABS
1.0000 g | ORAL_TABLET | Freq: Two times a day (BID) | ORAL | Status: DC
  Filled 2024-02-27 (×2): qty 1

## 2024-02-27 MED ORDER — DEXTROSE 5 % IV SOLN: INTRAVENOUS | Status: DC

## 2024-02-27 MED ORDER — MAGNESIUM SULFATE 2 GM/50ML IV SOLN
2.0000 g | Freq: Once | INTRAVENOUS | Status: AC
  Administered 2024-02-27: 2 g via INTRAVENOUS
  Filled 2024-02-27: qty 50

## 2024-02-27 NOTE — Plan of Care (Signed)

## 2024-02-27 NOTE — Progress Notes (Signed)
 NAME:  Nicholas Fletcher, MRN:  [id number], DOB:  Feb 08, 1976, LOS: 2 ADMISSION DATE:  02/24/2024  History of Present Illness:  48 yo M presenting to [facility] ED from home for evaluation of altered mental status.   History obtained per chart review and patient/spouse bedside report. On 02/22/24 patient thought he had a "cold or allergies". They reported nausea/ vomiting and diarrhea with fatigue, headache, chills and productive cough. Over the last few days they also noticed poor food intake and that the patient was always thirsty. Spouse reports he drank "about a gallon of water" over the last 24 hours. He endorses dizziness, muscle cramping, tremors. They deny seizure like activity, blurred vision, falls/trauma, fevers, or congestion. His wife also noticed on 02/24/24 that the patient starting having confused, slow and stuttering speech. During this he has continued to drink alcohol. He normally drinks 4-6 shots of liquor daily. On 5/3 he had 4 shots, on 5/4 he had 2 shots and only one beer on 5/5 around noon.   Of note patient had been admitted multiple times in 2023 & 2024 for hyponatremia and ETOH withdrawal with seizure activity. He was seen outpatient by a neurologist in 08/2023 and underwent an EEG, sleep EEG and MRI brain. He was not started on any AED. He was started on Lamictal  in 08/2023 by a psychiatrist for mood stability which he reports no longer taking. He denies recreational drug use, but does report daily vaping. 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   ED course: Upon arrival patient alert but altered with stable vitals. Labs significant for severe hyponatremia, hypokalemia, hypochloremia, chronic appearing Transaminitis, hypomagnesemia, lactic acidosis, elevated PCT, hypo-osmality. Imaging unremarkable.  Medications given: 1 L NS bolus and 150 mL bolus of 3% NaCL, 20 meq of K+ supplementation Initial Vitals: 98.6,, 18, 97, 145/94 & 100% on RA Significant labs: (Labs/  Imaging personally reviewed) I, Recardo Canal Rust-Chester, AGACNP-BC, personally viewed and interpreted this ECG. EKG Interpretation: Date: 02/24/24, EKG Time: 22:26, Rate: 89, Rhythm: NSR, QRS Axis:  normal, Intervals: prolonged Qtc, ST/T Wave abnormalities: none, Narrative Interpretation: NSR with prolonged Qtc Chemistry: Na+:113, K+: 2.8, BUN/Cr.: 5/ 0.65, Serum CO2/ AG: 22/24, AST/ALT: 235/ 66, Cl: 67 Hematology: WBC: recollect in process , Hgb: recollect in process,  Troponin: 4, Lactic/ PCT: 5.4/ 0.69, COVID-19 & Influenza A/B: pending   CXR 02/24/24: no active cardiopulmonary disease CT head wo contrast 02/24/24: no acute intracranial abnormality   PCCM consulted for admission due to acute symptomatic hypotonic hyponatremia in the setting of ETOH abuse and 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 Possible sepsis s/t unknown etiology. Na overcorrectd to 126 requiring multiple doses of Desmopressin  and starting D5W.  02/26/2024: Sodium at goal of 120. Complaining of blurry vision nausea and vomiting, Hypert saline 100cc bolus will be given. D5 and NS discontinued.   Interim History / Subjective:  Patient with improvement in symptoms. No major complaints.   Objective   Blood pressure 108/77, pulse 88, temperature 98.2 F (36.8 C), temperature source Oral, resp. rate 15, height 6' (1.829 m), weight 76.8 kg, SpO2 100%.  Intake/Output Summary (Last 24 hours) at 02/27/2024 1246 Last data filed at 02/27/2024 1100 Gross per 24 hour  Intake 1252.11 ml  Output 2485 ml  Net -1232.89 ml   Filed Weights    02/25/24 0400 02/26/24 0500 02/27/24 0432  Weight: 81.6 kg 86.6 kg 76.8 kg    Examination: General: NAD HENT: Supple neck, reactive pupils, EOMI Lungs: Clear bilateral air entry  Cardiovascular: Normal S1, Normal S2, RRR Abdomen: Soft, non tender, non distended.  Extremities: Warm and well perfused no edema.   Labs and imaging were reviewed.   Assessment & Plan:  Case of a 48 year old male patient with a past medical history of alcohol use disorder presenting with severe hyponatremia.  # Symptomatic hypoosmolar hyponatremia secondary to dehydration, decreased salt intake and alcohol use. # Alcohol use disorder with history of withdrawal seizures.  # Electrolyte derangement including hypokalemia hypomagnesemia and hypophosphatemia repleting aggressively  Neuro: D/c hypertonic saline. CIWA protocol.  Phenobarb taper. CVS: Maintain MAP greater than 65. Pulmonary: No issues GI: As needed Zofran .  Regular diet. Renal: Sodium every 4 hours.  Dc Hypertonic saline as above. Replete potassium magnesium  phosphorus aggressively. Heme: Lovenox  for DVT prophylaxis. Endo POC 140-180.  Ok for transfer to PCU under TRH.   Best Practice (right click and "Reselect all SmartList Selections" daily)   Diet/type: Regular consistency (see orders) DVT prophylaxis LMWH Pressure ulcer(s): N/A GI prophylaxis: N/A Lines: N/A Foley:  N/A Code Status:  full code  Last date of multidisciplinary goals of care discussion [02/26/2024]  Critical care time: 35 minutes     Annitta Kindler, MD [facility] 02/27/2024 12:46 PM

## 2024-02-27 NOTE — Consult Note (Signed)
 PHARMACY CONSULT NOTE - Hypertonic Saline  Pharmacy Consult for Hypertonic Saline Monitoring and Management  Recent Labs: Potassium (mmol/L)  Date Value  02/26/2024 3.1 (L)   Magnesium  (mg/dL)  Date Value  09/81/1914 1.6 (L)   Calcium (mg/dL)  Date Value  78/29/5621 8.2 (L)   Albumin (g/dL)  Date Value  30/86/5784 3.6   Phosphorus (mg/dL)  Date Value  69/62/9528 1.7 (L)   Sodium (mmol/L)  Date Value  02/27/2024 123 (L)    Assessment  Nicholas Fletcher is a 48 y.o. male presenting with altered mental status, hyponatremia and nausea/vomiting. PMH significant forhepatic steatosis, etoh abuse, esophagitis, diverticulosis, anxiety and seizures. Pharmacy has been consulted to monitor hypertonic saline (3%) infusion.  Pertinent medications: N/A  Baseline Labs: Serum Na 119, Serum Osm 260, Urine Na 22, Urine Osm 195  Goal of Therapy:  Increase in Na by 4-6 mEq/L in 4-6 hours Do not exceed increase in Na by 10-12 mEq/L in 24 hours  Monitoring:  Date Time Na Rate/Comment  5/7 1546 119 Baseline(HTS started at 1843) 5/7 2009 118 35ml/hr 5/7       2244    120     20 ml/hr 5/8       0056    123     20 ml/hr   Plan:  Increase 3% saline to 30 mL/hr Check Na Q2H x2 then Q4H  Stop infusion if  Na increases > 4 mEq/L in first 2 hours Na increases > 6 mEq/L in first 4 hours Na increases > 6 mEq/L in 6 hours  Na increases > 8 mEq/L in 8 hours (give D5W bolus) Continue to monitor for signs of clinical improvement and recommendations per nephrology  Marijah Larranaga D, PharmD Clinical Pharmacist 02/27/2024 1:40 AM

## 2024-02-27 NOTE — Consult Note (Addendum)
 PHARMACY CONSULT NOTE - Hypertonic Saline  Pharmacy Consult for Hypertonic Saline Monitoring and Management  Recent Labs: Potassium (mmol/L)  Date Value  02/27/2024 3.1 (L)   Magnesium  (mg/dL)  Date Value  78/46/9629 1.8   Calcium (mg/dL)  Date Value  52/84/1324 8.2 (L)   Albumin (g/dL)  Date Value  40/07/2724 3.6   Phosphorus (mg/dL)  Date Value  36/64/4034 2.8   Sodium (mmol/L)  Date Value  02/27/2024 123 (L)    Assessment  Nicholas Fletcher is a 48 y.o. male presenting with altered mental status, hyponatremia and nausea/vomiting. PMH significant forhepatic steatosis, etoh abuse, esophagitis, diverticulosis, anxiety and seizures. Pharmacy has been consulted to monitor hypertonic saline (3%) infusion.  Pertinent medications: N/A  Baseline Labs: Serum Na 119, Serum Osm 260, Urine Na 22, Urine Osm 195  Goal of Therapy:  Increase in Na by 4-6 mEq/L in 4-6 hours Do not exceed increase in Na by 10-12 mEq/L in 24 hours  Monitoring:  Date Time Na Rate/Comment  5/7 1546 119 Baseline(HTS started at 1843) 5/7 2009 118 34ml/hr 5/7       2244    120     30 ml/hr 5/8       0056    123     30 ml/hr  5/8       0421    123     30 ml/hr - increased to 40 ml/hr   Plan:  Increase 3% saline to 30 mL/hr Check Na Q2H x2 then Q4H  Stop infusion if  Na increases > 4 mEq/L in first 2 hours Na increases > 6 mEq/L in first 4 hours Na increases > 6 mEq/L in 6 hours  Na increases > 8 mEq/L in 8 hours (give D5W bolus) Continue to monitor for signs of clinical improvement and recommendations per nephrology  Davey Limas D, PharmD Clinical Pharmacist 02/27/2024 6:07 AM

## 2024-02-27 NOTE — Consult Note (Signed)
 PHARMACY CONSULT NOTE - ELECTROLYTES  Pharmacy Consult for Electrolyte Monitoring and Replacement   Recent Labs: Potassium (mmol/L)  Date Value  02/27/2024 3.1 (L)   Magnesium  (mg/dL)  Date Value  16/07/9603 1.8   Calcium (mg/dL)  Date Value  54/06/8118 8.2 (L)   Albumin (g/dL)  Date Value  14/78/2956 3.6   Phosphorus (mg/dL)  Date Value  21/30/8657 2.8   Sodium (mmol/L)  Date Value  02/27/2024 123 (L)   Height: 6' (182.9 cm) Weight: 76.8 kg (169 lb 5 oz) IBW/kg (Calculated) : 77.6 Estimated Creatinine Clearance: 120 mL/min (A) (by C-G formula based on SCr of 0.55 mg/dL (L)).  Assessment  Nicholas Fletcher is a 48 y.o. male presenting with hyponatremia. PMH significant for alcohol use disorder, withdrawal seizures, GIB, severe esophagitis, diverticulitis. Pharmacy has been consulted to monitor and replace electrolytes.  Diet: Regular MIVF: 3% NaCl at 40 cc/hr Pertinent medications: N/A  Goal of Therapy: Electrolytes within normal limits  Plan:  Na 123, + 3 mmol/L over last 24h. Continue current management with hypertonic saline K 3.1, Kcl 40 mEq q4h x 2 doses Mg 1.8, magnesium  sulfate 2 g IV x 1 Na checks currently ordered q4h, check BMP, Mg, and Phos tomorrow AM  Thank you for allowing pharmacy to be a part of this patient's care.  Page Boast 02/27/2024 7:36 AM

## 2024-02-28 DIAGNOSIS — F10932 Alcohol use, unspecified with withdrawal with perceptual disturbance: Secondary | ICD-10-CM

## 2024-02-28 DIAGNOSIS — G9341 Metabolic encephalopathy: Secondary | ICD-10-CM

## 2024-02-28 DIAGNOSIS — E871 Hypo-osmolality and hyponatremia: Secondary | ICD-10-CM | POA: Diagnosis not present

## 2024-02-28 DIAGNOSIS — E876 Hypokalemia: Secondary | ICD-10-CM | POA: Diagnosis not present

## 2024-02-28 LAB — BASIC METABOLIC PANEL WITH GFR
Anion gap: 5 (ref 5–15)
BUN: 7 mg/dL (ref 6–20)
CO2: 23 mmol/L (ref 22–32)
Calcium: 8.3 mg/dL — ABNORMAL LOW (ref 8.9–10.3)
Chloride: 99 mmol/L (ref 98–111)
Creatinine, Ser: 0.73 mg/dL (ref 0.61–1.24)
GFR, Estimated: >60 mL/min (ref >60)
Glucose, Bld: 93 mg/dL (ref 70–99)
Potassium: 4.2 mmol/L (ref 3.5–5.1)
Sodium: 127 mmol/L — ABNORMAL LOW (ref 135–145)

## 2024-02-28 LAB — CBC
HCT: 30.9 % — ABNORMAL LOW (ref 39.0–52.0)
Hemoglobin: 11.1 g/dL — ABNORMAL LOW (ref 13.0–17.0)
MCH: 35.9 pg — ABNORMAL HIGH (ref 26.0–34.0)
MCHC: 35.9 g/dL (ref 30.0–36.0)
MCV: 100 fL (ref 80.0–100.0)
Platelets: 159 10*3/uL (ref 150–400)
RBC: 3.09 MIL/uL — ABNORMAL LOW (ref 4.22–5.81)
RDW: 10.8 % — ABNORMAL LOW (ref 11.5–15.5)
WBC: 3.8 10*3/uL — ABNORMAL LOW (ref 4.0–10.5)
nRBC: 0 % (ref 0.0–0.2)

## 2024-02-28 LAB — PHOSPHORUS
Phosphorus: 1.3 mg/dL — ABNORMAL LOW (ref 2.5–4.6)
Phosphorus: 2.7 mg/dL (ref 2.5–4.6)

## 2024-02-28 LAB — MAGNESIUM: Magnesium: 1.7 mg/dL (ref 1.7–2.4)

## 2024-02-28 LAB — GLUCOSE, CAPILLARY
Glucose-Capillary: 106 mg/dL — ABNORMAL HIGH (ref 70–99)
Glucose-Capillary: 98 mg/dL (ref 70–99)

## 2024-02-28 LAB — SODIUM: Sodium: 130 mmol/L — ABNORMAL LOW (ref 135–145)

## 2024-02-28 MED ORDER — SODIUM PHOSPHATES 45 MMOLE/15ML IV SOLN
45.0000 mmol | Freq: Once | INTRAVENOUS | Status: AC
  Administered 2024-02-28: 45 mmol via INTRAVENOUS
  Filled 2024-02-28: qty 15

## 2024-02-28 MED ORDER — PANTOPRAZOLE SODIUM 40 MG PO TBEC
40.0000 mg | DELAYED_RELEASE_TABLET | Freq: Two times a day (BID) | ORAL | Status: DC
  Administered 2024-02-28 – 2024-02-29 (×3): 40 mg via ORAL
  Filled 2024-02-28 (×3): qty 1

## 2024-02-28 NOTE — Evaluation (Signed)
 Physical Therapy Evaluation Patient Details Name: Nicholas Fletcher MRN: [id number] DOB: [birth year] Today's Date: 02/28/2024  History of Present Illness  48 yo M presenting to [facility] ED from home for evaluation of altered mental status.  Clinical Impression  Pt wakes to voice, oriented x4, denied pain. Able to perform bed mobility modI, transfer modI, and ambulated to sink to brush teeth, wash face, all without assistance. He ambulate >286ft with AD, occasional handheld support due to staggering. Pt and pt discussed potential use of SPC at discharge, pt agreeable. DGI score of 21, and he was able to navigate a flight of stairs, CGA with rail. The patient demonstrated and reported near return to baseline level of functioning, no further acute PT needs indicated. PT to sign off. Please reconsult PT if pt status changes or acute needs are identified.          If plan is discharge home, recommend the following: Assist for transportation   Can travel by private vehicle    yes    Equipment Recommendations None recommended by PT (pt has cane at home and agreeable to use as needed)  Recommendations for Other Services    none   Functional Status Assessment   Pt nearing baseline level of functioning    Precautions / Restrictions Precautions Precautions: Fall Precaution/Restrictions Comments: moderate fall risk Restrictions Weight Bearing Restrictions Per Provider Order: No      Mobility  Bed Mobility Overal bed mobility: Modified Independent             General bed mobility comments: use of bed rail and HOB elevated. No physical assist provided.    Transfers Overall transfer level: Needs assistance Equipment used: None Transfers: Sit to/from Stand Sit to Stand: Supervision                Ambulation/Gait Ambulation/Gait assistance: Supervision, Contact guard assist Gait Distance (Feet): 250 Feet Assistive device: 1 person hand held assist         General Gait Details:  occasional staggering  Stairs Stairs: Yes Stairs assistance: Contact guard assist Stair Management: One rail Left, Alternating pattern Number of Stairs: 13    Wheelchair Mobility     Tilt Bed    Modified Rankin (Stroke Patients Only)       Balance   Sitting-balance support: Feet supported Sitting balance-Leahy Scale: Good     Standing balance support: No upper extremity supported Standing balance-Leahy Scale: Good                   Standardized Balance Assessment Standardized Balance Assessment : Dynamic Gait Index   Dynamic Gait Index Level Surface: Normal Change in Gait Speed: Normal Gait with Horizontal Head Turns: Normal Gait with Vertical Head Turns: Normal Gait and Pivot Turn: Mild Impairment Step Over Obstacle: Mild Impairment Step Around Obstacles: Normal Steps: Mild Impairment Total Score: 21       Pertinent Vitals/Pain Pain Assessment Pain Assessment: No/denies pain    Home Living Family/patient expects to be discharged to:: Private residence Living Arrangements: Spouse/significant other Available Help at Discharge: Family;Available PRN/intermittently Type of Home: House Home Access: Stairs to enter Entrance Stairs-Rails: Doctor, general practice of Steps: 6-7   Home Layout: Multi-level;Able to live on main level with bedroom/bathroom Home Equipment: Jeananne Mighty - single point      Prior Function Prior Level of Function : Independent/Modified Independent;Driving                     Extremity/Trunk Assessment  Upper Extremity Assessment Upper Extremity Assessment: Overall WFL for tasks assessed    Lower Extremity Assessment Lower Extremity Assessment: Overall WFL for tasks assessed       Communication   Communication Communication: No apparent difficulties    Cognition Arousal: Alert Behavior During Therapy: WFL for tasks assessed/performed                             Following commands:  Intact       Cueing       General Comments      Exercises     Assessment/Plan    PT Assessment Patient does not need any further PT services  PT Problem List         PT Treatment Interventions      PT Goals (Current goals can be found in the Care Plan section)       Frequency       Co-evaluation               AM-PAC PT "6 Clicks" Mobility  Outcome Measure Help needed turning from your back to your side while in a flat bed without using bedrails?: None Help needed moving from lying on your back to sitting on the side of a flat bed without using bedrails?: None Help needed moving to and from a bed to a chair (including a wheelchair)?: None Help needed standing up from a chair using your arms (e.g., wheelchair or bedside chair)?: None Help needed to walk in hospital room?: A Little Help needed climbing 3-5 steps with a railing? : A Little 6 Click Score: 22    End of Session Equipment Utilized During Treatment: Gait belt Activity Tolerance: Patient tolerated treatment well Patient left: in bed;with call bell/phone within reach;with bed alarm set Nurse Communication: Mobility status PT Visit Diagnosis: Other abnormalities of gait and mobility (R26.89)    Time: 8295-6213 PT Time Calculation (min) (ACUTE ONLY): 18 min   Charges:   PT Evaluation $PT Eval Low Complexity: 1 Low PT Treatments $Therapeutic Activity: 8-22 mins PT General Charges $$ ACUTE PT VISIT: 1 Visit       Darien Eden PT, DPT 2:56 PM,02/28/24

## 2024-02-28 NOTE — Progress Notes (Signed)
 PROGRESS NOTE    Nicholas Fletcher  RUE:[id number] DOB: 1976/07/28 DOA: 02/24/2024 PCP: System, Provider Not In  Subjective: Pt seen and examined. Pt was drinking about 16 ounces of vodka per day for many years.  Stopped all etoh about 7-9 days prior to admission to hospital.  Now on hospital day #4.  Feels better. Eating breakfast without difficulty.  No further need for PICC line. Was placed for 3% IV saline. Will remove PICC.   Hospital Course: HPI: 48 yo M presenting to [facility] ED from home for evaluation of altered mental status.   History obtained per chart review and patient/spouse bedside report. On 02/22/24 patient thought he had a "cold or allergies". They reported nausea/ vomiting and diarrhea with fatigue, headache, chills and productive cough. Over the last few days they also noticed poor food intake and that the patient was always thirsty. Spouse reports he drank "about a gallon of water" over the last 24 hours. He endorses dizziness, muscle cramping, tremors. They deny seizure like activity, blurred vision, falls/trauma, fevers, or congestion. His wife also noticed on 02/24/24 that the patient starting having confused, slow and stuttering speech. During this he has continued to drink alcohol. He normally drinks 4-6 shots of liquor daily. On 5/3 he had 4 shots, on 5/4 he had 2 shots and only one beer on 5/5 around noon.   Of note patient had been admitted multiple times in 2023 & 2024 for hyponatremia and ETOH withdrawal with seizure activity. He was seen outpatient by a neurologist in 08/2023 and underwent an EEG, sleep EEG and MRI brain. He was not started on any AED. He was started on Lamictal  in 08/2023 by a psychiatrist for mood stability which he reports no longer taking. He denies recreational drug use, but does report daily vaping. 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 Possible sepsis s/t unknown etiology. Na overcorrectd to 126 requiring multiple doses of Desmopressin  and starting D5W.  02/26/2024: Sodium at goal of 120. Complaining of blurry vision nausea and vomiting, Hypert saline 100cc bolus will be given. D5 and NS discontinued 02-28-2024 Care transferred to [facility]  Significant Labs: WBC 3.8, HgB 15.6, plt 156 Na 113, K 2.8, Cl 67, CO2 of 22, BUN 5, Scr 0.65, glu 83 Mg 1.6 UDS negative Procal 0.69 Serum osm 260, urine osm 195 Urine Na 22  Significant Imaging Studies: CXR No active cardiopulmonary disease.  CT head No acute intracranial abnormality.  CT abd/pelvis Diffuse fatty infiltration of the liver. Cholelithiasis. Mildly enlarged prostate. No acute findings   Antibiotic Therapy: Anti-infectives (From admission, onward)    Start     Dose/Rate Route Frequency Ordered Stop   02/25/24 0315  piperacillin -tazobactam (ZOSYN ) IVPB 3.375 g  Status:  Discontinued        3.375 g 12.5 mL/hr over 240 Minutes Intravenous Every 8 hours 02/25/24 0224 02/25/24 1054       Procedures: 02-26-2024 PICC line  Consultants: PCCM - admitting service    Assessment and Plan: * Acute hyponatremia 02-28-2024 TRH assumed care.  Na 127. Pt on regular diet. No further need for IVF. Hyponatremia due to hypovolemia, poor salt intake and chronic etoh abuse. Not SIADH. Will improve over time.  Transfer to med/surg bed.  Alcohol withdrawal (HCC) 02-28-2024 completed etoh withdrawal. on phenobarb taper. No further need  for CIWA score. Potential for DC tomorrow. Has been without ETOH for over 1 week now. Risk of etoh withdrawal seizure is minimal.  Acute metabolic encephalopathy 02-28-2024 likely due to acute hyponatremia. Resolved.  Hypophosphatemia 02-28-2024 getting IV replacement today.  Hypokalemia 02-28-2024 repleted and resolved.  ETOH abuse 02-28-2024 on phenobarb  taper. No further need for CIWA score. Potential for DC tomorrow. Has been without ETOH for over 1 week now. Risk of etoh withdrawal seizure is minimal.  DVT prophylaxis: enoxaparin  (LOVENOX ) injection 40 mg Start: 02/25/24 0800 SCDs Start: 02/25/24 0015    Code Status: Full Code Family Communication: no family at bedside. Pt is decisional. Disposition Plan: return home Reason for continuing need for hospitalization: transfer to med/surg bed. Home tomorrow.  Objective: Vitals:   02/28/24 0600 02/28/24 0700 02/28/24 1018 02/28/24 1154  BP: 102/75 110/81 (!) 132/101 130/88  Pulse: 83 76 84 85  Resp: 13 11 14 18   Temp:    98.3 F (36.8 C)  TempSrc:    Oral  SpO2: 100% 100% 99% 100%  Weight:      Height:        Intake/Output Summary (Last 24 hours) at 02/28/2024 1354 Last data filed at 02/28/2024 1200 Gross per 24 hour  Intake 317.14 ml  Output 2375 ml  Net -2057.86 ml   Filed Weights   02/25/24 0400 02/26/24 0500 02/27/24 0432  Weight: 81.6 kg 86.6 kg 76.8 kg    Examination:  Physical Exam Vitals and nursing note reviewed.  Constitutional:      General: He is not in acute distress.    Appearance: He is normal weight. He is not toxic-appearing or diaphoretic.  HENT:     Head: Normocephalic and atraumatic.     Nose: Nose normal.  Eyes:     General: No scleral icterus. Cardiovascular:     Rate and Rhythm: Normal rate and regular rhythm.     Pulses: Normal pulses.  Pulmonary:     Effort: Pulmonary effort is normal. No respiratory distress.     Breath sounds: Normal breath sounds. No wheezing.  Abdominal:     General: Abdomen is flat. Bowel sounds are normal. There is no distension.     Palpations: Abdomen is soft.  Musculoskeletal:     Right lower leg: No edema.     Left lower leg: No edema.  Skin:    General: Skin is warm and dry.     Capillary Refill: Capillary refill takes less than 2 seconds.  Neurological:     General: No focal deficit present.     Mental  Status: He is alert and oriented to person, place, and time.     Data Reviewed: I have personally reviewed following labs and imaging studies  CBC: Recent Labs  Lab 02/24/24 2227 02/25/24 0058 02/25/24 0306 02/26/24 0455 02/27/24 0412 02/28/24 0447  WBC 3.8* 4.2 3.6* 2.6* 3.8* 3.8*  NEUTROABS 1.9 2.4  --   --   --   --   HGB 15.6 12.8* 13.0 11.4* 11.7* 11.1*  HCT RESULTS UNAVAILABLE DUE TO INTERFERING SUBSTANCE RESULTS UNAVAILABLE DUE TO INTERFERING SUBSTANCE RESULTS UNAVAILABLE DUE TO INTERFERING SUBSTANCE 31.2* 31.2* 30.9*  MCV RESULTS UNAVAILABLE DUE TO INTERFERING SUBSTANCE RESULTS UNAVAILABLE DUE TO INTERFERING SUBSTANCE RESULTS UNAVAILABLE DUE TO INTERFERING SUBSTANCE 96.9 95.7 100.0  PLT 156 144* 143* 109* 126* 159   Basic Metabolic Panel: Recent Labs  Lab 02/24/24 2237 02/25/24 0019 02/25/24 0058 02/25/24 0306 02/25/24 0630 02/25/24 1110 02/25/24 1347 02/26/24 0455  02/26/24 0654 02/27/24 7829 02/27/24 0820 02/27/24 1706 02/27/24 2024 02/28/24 0114 02/28/24 0447  NA  --   --    < > 120*   < > 125*   < > 120*   < > 123* 129*  129* 131* 129* 130* 127*  K  --   --   --  3.0*  --  3.8  --  3.1*  --  3.1*  --   --   --   --  4.2  CL  --   --   --  76*  --  84*  --  88*  --  90*  --   --   --   --  99  CO2  --   --   --  26  --  27  --  25  --  27  --   --   --   --  23  GLUCOSE  --   --   --  80  --  80  --  101*  --  118*  --   --   --   --  93  BUN  --   --   --  5*  --  7  --  7  --  <5*  --   --   --   --  7  CREATININE  --   --   --  0.74  --  0.81  --  0.79  --  0.55*  --   --   --   --  0.73  CALCIUM  --   --   --  8.4*  --  8.3*  --  8.2*  --  8.2*  --   --   --   --  8.3*  MG 1.6*  --   --  2.9*  --   --   --  1.6*  --  1.8  --   --   --   --  1.7  PHOS  --  2.4*  --  2.6  --   --   --  1.7*  --  2.8  --   --   --   --  1.3*   < > = values in this interval not displayed.   GFR: Estimated Creatinine Clearance: 120 mL/min (by C-G formula based on SCr of  0.73 mg/dL). Liver Function Tests: Recent Labs  Lab 02/24/24 2227 02/25/24 0306  AST 235* 173*  ALT 66* 52*  ALKPHOS 104 87  BILITOT 3.6* 3.2*  PROT 7.3 6.2*  ALBUMIN 4.2 3.6    Recent Labs  Lab 02/25/24 0432  AMMONIA 27   Coagulation Profile: Recent Labs  Lab 02/25/24 0630  INR 1.0   CBG: Recent Labs  Lab 02/27/24 1642 02/27/24 1937 02/27/24 2321 02/28/24 0337 02/28/24 0729  GLUCAP 115* 92 112* 106* 98   Anemia Panel: Recent Labs    02/27/24 0412  VITAMINB12 281  FOLATE 9.4   Sepsis Labs: Recent Labs  Lab 02/25/24 0019 02/25/24 0306 02/25/24 0630 02/25/24 1110 02/26/24 0455  PROCALCITON 0.69  --  0.91  --  0.63  LATICACIDVEN 5.3* 3.2*  --  0.7  --     Recent Results (from the past 240 hours)  MRSA Next Gen by PCR, Nasal     Status: None   Collection Time: 02/25/24 12:26 AM   Specimen: Nasal Mucosa; Nasal Swab  Result Value Ref Range Status   MRSA by PCR  Next Gen NOT DETECTED NOT DETECTED Final    Comment: (NOTE) The GeneXpert MRSA Assay (FDA approved for NASAL specimens only), is one component of a comprehensive MRSA colonization surveillance program. It is not intended to diagnose MRSA infection nor to guide or monitor treatment for MRSA infections. Test performance is not FDA approved in patients less than 110 years old. Performed at [facility], [address] [place], [place] [address]   Resp panel by RT-PCR (RSV, Flu A&B, Covid) Anterior Nasal Swab     Status: None   Collection Time: 02/25/24 12:59 AM   Specimen: Anterior Nasal Swab  Result Value Ref Range Status   SARS Coronavirus 2 by RT PCR NEGATIVE NEGATIVE Final    Comment: (NOTE) SARS-CoV-2 target nucleic acids are NOT DETECTED.  The SARS-CoV-2 RNA is generally detectable in upper respiratory specimens during the acute phase of infection. The lowest concentration of SARS-CoV-2 viral copies this assay can detect is 138 copies/mL. A negative result does not preclude  SARS-Cov-2 infection and should not be used as the sole basis for treatment or other patient management decisions. A negative result may occur with  improper specimen collection/handling, submission of specimen other than nasopharyngeal swab, presence of viral mutation(s) within the areas targeted by this assay, and inadequate number of viral copies(<138 copies/mL). A negative result must be combined with clinical observations, patient history, and epidemiological information. The expected result is Negative.  Fact Sheet for Patients:  BloggerCourse.com  Fact Sheet for Healthcare Providers:  SeriousBroker.it  This test is no t yet approved or cleared by the [place]  FDA and  has been authorized for detection and/or diagnosis of SARS-CoV-2 by FDA under an Emergency Use Authorization (EUA). This EUA will remain  in effect (meaning this test can be used) for the duration of the COVID-19 declaration under Section 564(b)(1) of the Act, 21 U.S.C.section 360bbb-3(b)(1), unless the authorization is terminated  or revoked sooner.       Influenza A by PCR NEGATIVE NEGATIVE Final   Influenza B by PCR NEGATIVE NEGATIVE Final    Comment: (NOTE) The Xpert Xpress SARS-CoV-2/FLU/RSV plus assay is intended as an aid in the diagnosis of influenza from Nasopharyngeal swab specimens and should not be used as a sole basis for treatment. Nasal washings and aspirates are unacceptable for Xpert Xpress SARS-CoV-2/FLU/RSV testing.  Fact Sheet for Patients: BloggerCourse.com  Fact Sheet for Healthcare Providers: SeriousBroker.it  This test is not yet approved or cleared by the [place]  FDA and has been authorized for detection and/or diagnosis of SARS-CoV-2 by FDA under an Emergency Use Authorization (EUA). This EUA will remain in effect (meaning this test can be used) for the duration of  the COVID-19 declaration under Section 564(b)(1) of the Act, 21 U.S.C. section 360bbb-3(b)(1), unless the authorization is terminated or revoked.     Resp Syncytial Virus by PCR NEGATIVE NEGATIVE Final    Comment: (NOTE) Fact Sheet for Patients: BloggerCourse.com  Fact Sheet for Healthcare Providers: SeriousBroker.it  This test is not yet approved or cleared by the [place]  FDA and has been authorized for detection and/or diagnosis of SARS-CoV-2 by FDA under an Emergency Use Authorization (EUA). This EUA will remain in effect (meaning this test can be used) for the duration of the COVID-19 declaration under Section 564(b)(1) of the Act, 21 U.S.C. section 360bbb-3(b)(1), unless the authorization is terminated or revoked.  Performed at [facility], [address], [place], [place] [address]   Gastrointestinal Panel by PCR ,  Stool     Status: None   Collection Time: 02/25/24  8:58 PM   Specimen: Stool  Result Value Ref Range Status   Campylobacter species NOT DETECTED NOT DETECTED Final   Plesimonas shigelloides NOT DETECTED NOT DETECTED Final   Salmonella species NOT DETECTED NOT DETECTED Final   Yersinia enterocolitica NOT DETECTED NOT DETECTED Final   Vibrio species NOT DETECTED NOT DETECTED Final   Vibrio cholerae NOT DETECTED NOT DETECTED Final   Enteroaggregative E coli (EAEC) NOT DETECTED NOT DETECTED Final   Enteropathogenic E coli (EPEC) NOT DETECTED NOT DETECTED Final   Enterotoxigenic E coli (ETEC) NOT DETECTED NOT DETECTED Final   Shiga like toxin producing E coli (STEC) NOT DETECTED NOT DETECTED Final   Shigella/Enteroinvasive E coli (EIEC) NOT DETECTED NOT DETECTED Final   Cryptosporidium NOT DETECTED NOT DETECTED Final   Cyclospora cayetanensis NOT DETECTED NOT DETECTED Final   Entamoeba histolytica NOT DETECTED NOT DETECTED Final   Giardia lamblia NOT DETECTED NOT DETECTED Final   Adenovirus  F40/41 NOT DETECTED NOT DETECTED Final   Astrovirus NOT DETECTED NOT DETECTED Final   Norovirus GI/GII NOT DETECTED NOT DETECTED Final   Rotavirus A NOT DETECTED NOT DETECTED Final   Sapovirus (I, II, IV, and V) NOT DETECTED NOT DETECTED Final    Comment: Performed at [facility], [address] [place], [place] [address]  C Difficile Quick Screen w PCR reflex     Status: None   Collection Time: 02/25/24  8:58 PM   Specimen: STOOL  Result Value Ref Range Status   C Diff antigen NEGATIVE NEGATIVE Final   C Diff toxin NEGATIVE NEGATIVE Final   C Diff interpretation No C. difficile detected.  Final    Comment: Performed at [facility], [address], [place], [place] [address]     Radiology Studies: US  EKG SITE RITE Result Date: 02/26/2024 If [facility] image not attached, placement could not be confirmed due to current cardiac rhythm.   Scheduled Meds:  Chlorhexidine  Gluconate Cloth  6 each Topical Daily   enoxaparin  (LOVENOX ) injection  40 mg Subcutaneous Q24H   feeding supplement  237 mL Oral TID BM   folic acid   1 mg Oral Daily   multivitamin with minerals  1 tablet Oral Daily   pantoprazole   40 mg Oral BID   PHENObarbital   65 mg Intravenous Q8H   Followed by   [START ON 02/29/2024] PHENObarbital   32.5 mg Intravenous Q8H   pneumococcal 20-valent conjugate vaccine  0.5 mL Intramuscular Tomorrow-1000   [START ON 02/29/2024] thiamine   100 mg Oral Daily   Or   [START ON 02/29/2024] thiamine   100 mg Intravenous Daily   Continuous Infusions:  sodium phosphate 45 mmol in sodium chloride  0.9 % 250 mL infusion 44 mL/hr at 02/28/24 1200     LOS: 3 days   Time spent: 50 minutes  Unk Garb, DO  [facility]  02/28/2024, 1:54 PM

## 2024-02-28 NOTE — Assessment & Plan Note (Signed)
 02-28-2024 on phenobarb taper. No further need for CIWA score. Potential for DC tomorrow. Has been without ETOH for over 1 week now. Risk of etoh withdrawal seizure is minimal.  02-29-2024 complete phenobarb taper at home. Pt advised he is to absolutely abstain from ALL etoh intake.

## 2024-02-28 NOTE — Progress Notes (Signed)
 SLP Cancellation Note  Patient Details Name: Nicholas Fletcher MRN: [id number] DOB: [birth year]   Cancelled treatment:       Reason Eval/Treat Not Completed: SLP screened, no needs identified, will sign off (chart reviewed; consulted NSG and met w/ pt/Wife in room.)  Pt is a 48 y/o male with h/o Hepatic steatosis, Current ETOH Abuse, Esophagitis, Diverticulosis, anxiety and Seizures who is admitted with AMS, hyponatremia and nausea/vomiting w/ poor po intake in recent days though continued to consume alcohol. He was admitted w/ Hyponatremia. Of note patient had been admitted multiple times in 2023 & 2024 for hyponatremia and ETOH withdrawal with seizure activity. He was seen outpatient by a neurologist in 08/2023 and underwent an EEG, sleep EEG and MRI brain. RD has discussed with pt the importance of adequate nutrition needed to preserve lean muscle.  Head CT this admit and last: No acute intracranial abnormality.   Pt denied any difficulty swallowing and is currently on a regular diet; tolerates swallowing pills w/ water per NSG. Pt conversed in conversation w/out expressive/receptive deficits noted; pt denied any speech-language deficits. Speech clear. No further skilled ST services indicated as pt appears at his baseline. Pt agreed. NSG to reconsult if any change in status while admitted.      Darla Edward, MS, CCC-SLP Speech Language Pathologist Rehab Services; [facility] [phone] (ascom) Charika Mikelson 02/28/2024, 9:44 AM

## 2024-02-28 NOTE — Assessment & Plan Note (Signed)
 02-28-2024 repleted and resolved.

## 2024-02-28 NOTE — Evaluation (Signed)
 Occupational Therapy Evaluation Patient Details Name: Nicholas Fletcher MRN: [id number] DOB: December 21, 1975 Today's Date: 02/28/2024   History of Present Illness   48 yo M presenting to [facility] ED from home for evaluation of altered mental status.     Clinical Impressions Upon entering the room, pt seated in recliner chair and agreeable to OT intervention. Pt reports living at home with wife and being Ind at baseline. He endorses having a SPC but does not use. Pt performing bed mobility without assistance and stands with supervision. Pt ambulating 300' with supervision progressing to Mod I without LOB notes. Pt does not need skilled acute OT intervention. OT to complete orders at this time.       Functional Status Assessment   Patient has not had a recent decline in their functional status     Equipment Recommendations   None recommended by OT      Precautions/Restrictions   Precautions Precautions: Fall Precaution/Restrictions Comments: moderate fall risk     Mobility Bed Mobility Overal bed mobility: Modified Independent             General bed mobility comments: use of bed rail and HOB elevated. No physical assist provided.    Transfers Overall transfer level: Needs assistance Equipment used: None Transfers: Sit to/from Stand Sit to Stand: Supervision                  Balance Overall balance assessment: No apparent balance deficits (not formally assessed)                                         ADL either performed or assessed with clinical judgement   ADL Overall ADL's : At baseline                                       General ADL Comments: no physical assistance provided     Vision Patient Visual Report: No change from baseline              Pertinent Vitals/Pain Pain Assessment Pain Assessment: No/denies pain     Extremity/Trunk Assessment Upper Extremity Assessment Upper Extremity Assessment: Overall  WFL for tasks assessed   Lower Extremity Assessment Lower Extremity Assessment: Overall WFL for tasks assessed       Communication Communication Communication: No apparent difficulties   Cognition Arousal: Alert Behavior During Therapy: WFL for tasks assessed/performed Cognition: No apparent impairments                               Following commands: Intact       Cueing  General Comments   Cueing Techniques: Verbal cues              Home Living Family/patient expects to be discharged to:: Private residence Living Arrangements: Spouse/significant other Available Help at Discharge: Family;Available PRN/intermittently Type of Home: House Home Access: Stairs to enter [employer] of Steps: 6-7 Entrance Stairs-Rails: Right;Left Home Layout: Multi-level;Able to live on main level with bedroom/bathroom     Bathroom Shower/Tub: Tub/shower unit         Home Equipment: Cane - single point          Prior Functioning/Environment Prior Level of Function : Independent/Modified Independent;Driving  OT Goals(Current goals can be found in the care plan section)   Acute Rehab OT Goals Patient Stated Goal: to go home OT Goal Formulation: With patient Time For Goal Achievement: 02/28/24   AM-PAC OT "6 Clicks" Daily Activity     Outcome Measure Help from another person eating meals?: None Help from another person taking care of personal grooming?: None Help from another person toileting, which includes using toliet, bedpan, or urinal?: None Help from another person bathing (including washing, rinsing, drying)?: None Help from another person to put on and taking off regular upper body clothing?: None Help from another person to put on and taking off regular lower body clothing?: None 6 Click Score: 24   End of Session Nurse Communication: Mobility status  Activity Tolerance: Patient tolerated treatment  well Patient left: in chair;with call bell/phone within reach                   Time: 1043-1103 OT Time Calculation (min): 20 min Charges:  OT General Charges $OT Visit: 1 Visit OT Evaluation $OT Eval Low Complexity: 1 Low OT Treatments $Therapeutic Activity: 8-22 mins George Kinder, MS, OTR/L , CBIS ascom [phone]  02/28/24, 1:44 PM

## 2024-02-28 NOTE — Assessment & Plan Note (Signed)
 02-28-2024 getting IV replacement today.

## 2024-02-28 NOTE — Assessment & Plan Note (Signed)
 02-28-2024 completed etoh withdrawal. on phenobarb taper. No further need for CIWA score. Potential for DC tomorrow. Has been without ETOH for over 1 week now. Risk of etoh withdrawal seizure is minimal.  02-29-2024 complete phenobarb taper at home. Pt advised he is to absolutely abstain from ALL etoh intake.

## 2024-02-28 NOTE — Assessment & Plan Note (Signed)
 02-28-2024 TRH assumed care.  Na 127. Pt on regular diet. No further need for IVF. Hyponatremia due to hypovolemia, poor salt intake and chronic etoh abuse. Not SIADH. Will improve over time.  Transfer to med/surg bed.  02-29-2024 stable for DC. Na up to 134 today. Resolved hyponatremia

## 2024-02-28 NOTE — Hospital Course (Signed)
 HPI: 48 yo M presenting to [facility] ED from home for evaluation of altered mental status.   History obtained per chart review and patient/spouse bedside report. On 02/22/24 patient thought he had a "cold or allergies". They reported nausea/ vomiting and diarrhea with fatigue, headache, chills and productive cough. Over the last few days they also noticed poor food intake and that the patient was always thirsty. Spouse reports he drank "about a gallon of water" over the last 24 hours. He endorses dizziness, muscle cramping, tremors. They deny seizure like activity, blurred vision, falls/trauma, fevers, or congestion. His wife also noticed on 02/24/24 that the patient starting having confused, slow and stuttering speech. During this he has continued to drink alcohol. He normally drinks 4-6 shots of liquor daily. On 5/3 he had 4 shots, on 5/4 he had 2 shots and only one beer on 5/5 around noon.   Of note patient had been admitted multiple times in 2023 & 2024 for hyponatremia and ETOH withdrawal with seizure activity. He was seen outpatient by a neurologist in 08/2023 and underwent an EEG, sleep EEG and MRI brain. He was not started on any AED. He was started on Lamictal  in 08/2023 by a psychiatrist for mood stability which he reports no longer taking. He denies recreational drug use, but does report daily vaping. 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 Possible sepsis s/t unknown etiology. Na overcorrectd to 126 requiring multiple doses of Desmopressin  and starting D5W.  02/26/2024: Sodium at goal of 120. Complaining of blurry vision nausea and vomiting, Hypert saline 100cc bolus will be given. D5 and NS discontinued 02-28-2024 Care transferred to [facility]  Significant Labs: WBC 3.8, HgB 15.6, plt 156 Na 113, K 2.8,  Cl 67, CO2 of 22, BUN 5, Scr 0.65, glu 83 Mg 1.6 UDS negative Procal 0.69 Serum osm 260, urine osm 195 Urine Na 22  Significant Imaging Studies: CXR No active cardiopulmonary disease.  CT head No acute intracranial abnormality.  CT abd/pelvis Diffuse fatty infiltration of the liver. Cholelithiasis. Mildly enlarged prostate. No acute findings   Antibiotic Therapy: Anti-infectives (From admission, onward)    Start     Dose/Rate Route Frequency Ordered Stop   02/25/24 0315  piperacillin -tazobactam (ZOSYN ) IVPB 3.375 g  Status:  Discontinued        3.375 g 12.5 mL/hr over 240 Minutes Intravenous Every 8 hours 02/25/24 0224 02/25/24 1054       Procedures: 02-26-2024 PICC line  Consultants: PCCM - admitting service

## 2024-02-28 NOTE — TOC Progression Note (Signed)
 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 Possible sepsis s/t unknown etiology. Pt started  phenobarb taper today , CIWA dc'd,  tentative   DC tomorrow. This CM spoke with pt's wife in f/u to [facility] consult and provided additional substance abuse resources and explained in detail the current process for TOC/CM with substance abuse referrals. [name] verbalized understanding of all info discussed TOC will cont to follow dc planning / care coordination          Social Determinants of Health (SDOH) Interventions SDOH Screenings   Food Insecurity: No Food Insecurity (02/25/2024)  Housing: Low Risk  (02/25/2024)  Transportation Needs: No Transportation Needs (02/25/2024)  Utilities: Not At Risk (02/25/2024)  Alcohol Screen: Medium Risk (07/05/2023)  Depression (PHQ2-9): Medium Risk (09/18/2023)  Social Connections: Unknown (03/06/2022)   Received from [facility]  Tobacco Use: High Risk (02/24/2024)    Readmission Risk Interventions     No data to display

## 2024-02-28 NOTE — Subjective & Objective (Addendum)
 Pt seen and examined. Pt was drinking about 16 ounces of vodka per day for many years.  Stopped all etoh about 7-9 days prior to admission to hospital.  Now on hospital day #4.  Feels better. Eating breakfast without difficulty.  No further need for PICC line. Was placed for 3% IV saline. Will remove PICC.

## 2024-02-28 NOTE — Assessment & Plan Note (Signed)
 02-28-2024 likely due to acute hyponatremia. Resolved.

## 2024-02-29 DIAGNOSIS — G9341 Metabolic encephalopathy: Secondary | ICD-10-CM | POA: Diagnosis not present

## 2024-02-29 DIAGNOSIS — E876 Hypokalemia: Secondary | ICD-10-CM | POA: Diagnosis not present

## 2024-02-29 DIAGNOSIS — F10932 Alcohol use, unspecified with withdrawal with perceptual disturbance: Secondary | ICD-10-CM | POA: Diagnosis not present

## 2024-02-29 DIAGNOSIS — R7989 Other specified abnormal findings of blood chemistry: Secondary | ICD-10-CM | POA: Insufficient documentation

## 2024-02-29 DIAGNOSIS — E871 Hypo-osmolality and hyponatremia: Secondary | ICD-10-CM | POA: Diagnosis not present

## 2024-02-29 LAB — MAGNESIUM: Magnesium: 1.7 mg/dL (ref 1.7–2.4)

## 2024-02-29 LAB — BASIC METABOLIC PANEL WITH GFR
Anion gap: 7 (ref 5–15)
BUN: 7 mg/dL (ref 6–20)
CO2: 26 mmol/L (ref 22–32)
Calcium: 9 mg/dL (ref 8.9–10.3)
Chloride: 101 mmol/L (ref 98–111)
Creatinine, Ser: 0.79 mg/dL (ref 0.61–1.24)
GFR, Estimated: >60 mL/min (ref >60)
Glucose, Bld: 96 mg/dL (ref 70–99)
Potassium: 4.2 mmol/L (ref 3.5–5.1)
Sodium: 134 mmol/L — ABNORMAL LOW (ref 135–145)

## 2024-02-29 LAB — PHOSPHORUS: Phosphorus: 3.3 mg/dL (ref 2.5–4.6)

## 2024-02-29 LAB — CBC
HCT: 32.9 % — ABNORMAL LOW (ref 39.0–52.0)
Hemoglobin: 11.6 g/dL — ABNORMAL LOW (ref 13.0–17.0)
MCH: 35.5 pg — ABNORMAL HIGH (ref 26.0–34.0)
MCHC: 35.3 g/dL (ref 30.0–36.0)
MCV: 100.6 fL — ABNORMAL HIGH (ref 80.0–100.0)
Platelets: 241 10*3/uL (ref 150–400)
RBC: 3.27 MIL/uL — ABNORMAL LOW (ref 4.22–5.81)
RDW: 11.1 % — ABNORMAL LOW (ref 11.5–15.5)
WBC: 4.5 10*3/uL (ref 4.0–10.5)
nRBC: 0 % (ref 0.0–0.2)

## 2024-02-29 MED ORDER — FOLIC ACID 1 MG PO TABS: 1.0000 mg | ORAL_TABLET | Freq: Every day | ORAL | 0 refills | Status: AC

## 2024-02-29 MED ORDER — ONDANSETRON HCL 4 MG PO TABS: 4.0000 mg | ORAL_TABLET | Freq: Four times a day (QID) | ORAL | 0 refills | Status: AC | PRN

## 2024-02-29 MED ORDER — PANTOPRAZOLE SODIUM 40 MG PO TBEC: 40.0000 mg | DELAYED_RELEASE_TABLET | Freq: Two times a day (BID) | ORAL | 0 refills | Status: DC

## 2024-02-29 MED ORDER — PHENOBARBITAL 30 MG PO TABS: ORAL_TABLET | ORAL | 0 refills | Status: DC

## 2024-02-29 MED ORDER — ADULT MULTIVITAMIN W/MINERALS CH: 1.0000 | ORAL_TABLET | Freq: Every day | ORAL | 0 refills | Status: AC

## 2024-02-29 MED ORDER — VITAMIN B-1 100 MG PO TABS: 100.0000 mg | ORAL_TABLET | Freq: Every day | ORAL | 0 refills | Status: AC

## 2024-02-29 NOTE — Progress Notes (Signed)
 PROGRESS NOTE    Nicholas Fletcher  YNW:[id number] DOB: 10-18-1976 DOA: 02/24/2024 PCP: System, Provider Not In  Subjective: Pt seen and examined.  Stable. Seen by PT yesterday. Walked over 200 feet Na up to 134 today. Ready for DC.   Hospital Course: HPI: 48 yo M presenting to [facility] ED from home for evaluation of altered mental status.   History obtained per chart review and patient/spouse bedside report. On 02/22/24 patient thought he had a "cold or allergies". They reported nausea/ vomiting and diarrhea with fatigue, headache, chills and productive cough. Over the last few days they also noticed poor food intake and that the patient was always thirsty. Spouse reports he drank "about a gallon of water" over the last 24 hours. He endorses dizziness, muscle cramping, tremors. They deny seizure like activity, blurred vision, falls/trauma, fevers, or congestion. His wife also noticed on 02/24/24 that the patient starting having confused, slow and stuttering speech. During this he has continued to drink alcohol. He normally drinks 4-6 shots of liquor daily. On 5/3 he had 4 shots, on 5/4 he had 2 shots and only one beer on 5/5 around noon.   Of note patient had been admitted multiple times in 2023 & 2024 for hyponatremia and ETOH withdrawal with seizure activity. He was seen outpatient by a neurologist in 08/2023 and underwent an EEG, sleep EEG and MRI brain. He was not started on any AED. He was started on Lamictal  in 08/2023 by a psychiatrist for mood stability which he reports no longer taking. He denies recreational drug use, but does report daily vaping. 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 Possible sepsis s/t unknown etiology. Na overcorrectd to 126 requiring multiple doses of Desmopressin  and  starting D5W.  02/26/2024: Sodium at goal of 120. Complaining of blurry vision nausea and vomiting, Hypert saline 100cc bolus will be given. D5 and NS discontinued 02-28-2024 Care transferred to [facility]  Significant Labs: WBC 3.8, HgB 15.6, plt 156 Na 113, K 2.8, Cl 67, CO2 of 22, BUN 5, Scr 0.65, glu 83 Mg 1.6 UDS negative Procal 0.69 Serum osm 260, urine osm 195 Urine Na 22  Significant Imaging Studies: CXR No active cardiopulmonary disease.  CT head No acute intracranial abnormality.  CT abd/pelvis Diffuse fatty infiltration of the liver. Cholelithiasis. Mildly enlarged prostate. No acute findings   Antibiotic Therapy: Anti-infectives (From admission, onward)    Start     Dose/Rate Route Frequency Ordered Stop   02/25/24 0315  piperacillin -tazobactam (ZOSYN ) IVPB 3.375 g  Status:  Discontinued        3.375 g 12.5 mL/hr over 240 Minutes Intravenous Every 8 hours 02/25/24 0224 02/25/24 1054       Procedures: 02-26-2024 PICC line  Consultants: PCCM - admitting service    Assessment and Plan: * Acute hyponatremia 02-28-2024 TRH assumed care.  Na 127. Pt on regular diet. No further need for IVF. Hyponatremia due to hypovolemia, poor salt intake and chronic etoh abuse. Not SIADH. Will improve over time.  Transfer to med/surg bed.  02-29-2024 stable for DC. Na up to 134 today. Resolved hyponatremia  Alcohol withdrawal (HCC) 02-28-2024 completed etoh withdrawal. on phenobarb taper. No further need for CIWA score. Potential for DC tomorrow. Has been without ETOH for over 1 week now. Risk of etoh withdrawal seizure is minimal.  02-29-2024 complete phenobarb taper at home. Pt advised he is to absolutely abstain from ALL etoh intake.  Acute metabolic encephalopathy-resolved as of 02/29/2024 02-28-2024 likely due to acute hyponatremia. Resolved.  ETOH abuse 02-28-2024 on phenobarb taper. No further need for CIWA score. Potential for DC tomorrow. Has been without ETOH for over 1 week  now. Risk of etoh withdrawal seizure is minimal.  02-29-2024 complete phenobarb taper at home. Pt advised he is to absolutely abstain from ALL etoh intake.   Hypophosphatemia-resolved as of 02/29/2024 02-28-2024 getting IV replacement today.  Hypokalemia-resolved as of 02/29/2024 02-28-2024 repleted and resolved.  Elevated LFTs 02-29-2024 likely due to etoh abuse. Will need repeat LFTs at PCP f/u appointment in 2 weeks.  DVT prophylaxis: SCDs Start: 02/25/24 0015  Lovenox    Code Status: Full Code Family Communication: no family at bedside. Pt is decisional. Disposition Plan: return home Reason for continuing need for hospitalization: stable for DC.  Objective: Vitals:   02/28/24 2024 02/29/24 0320 02/29/24 0431 02/29/24 0745  BP: (!) 140/88 118/72  (!) 136/98  Pulse: 93 80  77  Resp: 18     Temp: 98.2 F (36.8 C) 98.5 F (36.9 C)  98 F (36.7 C)  TempSrc:      SpO2: 100% 100%  100%  Weight:   76.8 kg   Height:        Intake/Output Summary (Last 24 hours) at 02/29/2024 0843 Last data filed at 02/28/2024 2030 Gross per 24 hour  Intake 793.37 ml  Output 175 ml  Net 618.37 ml   Filed Weights   02/26/24 0500 02/27/24 0432 02/29/24 0431  Weight: 86.6 kg 76.8 kg 76.8 kg    Examination:  Physical Exam Vitals and nursing note reviewed.  Constitutional:      General: He is not in acute distress.    Appearance: He is normal weight. He is not toxic-appearing or diaphoretic.  HENT:     Head: Normocephalic and atraumatic.     Nose: Nose normal.  Cardiovascular:     Rate and Rhythm: Normal rate and regular rhythm.  Pulmonary:     Effort: Pulmonary effort is normal.     Breath sounds: Normal breath sounds.  Abdominal:     General: Abdomen is flat. Bowel sounds are normal.     Palpations: Abdomen is soft.  Musculoskeletal:     Right lower leg: No edema.     Left lower leg: No edema.  Skin:    General: Skin is warm and dry.     Capillary Refill: Capillary refill takes  less than 2 seconds.  Neurological:     General: No focal deficit present.     Mental Status: He is alert and oriented to person, place, and time.     Data Reviewed: I have personally reviewed following labs and imaging studies  CBC: Recent Labs  Lab 02/24/24 2227 02/25/24 0058 02/25/24 0306 02/26/24 0455 02/27/24 0412 02/28/24 0447 02/29/24 0423  WBC 3.8* 4.2 3.6* 2.6* 3.8* 3.8* 4.5  NEUTROABS 1.9 2.4  --   --   --   --   --   HGB 15.6 12.8* 13.0 11.4* 11.7* 11.1* 11.6*  HCT RESULTS UNAVAILABLE DUE TO INTERFERING SUBSTANCE RESULTS UNAVAILABLE DUE TO INTERFERING SUBSTANCE RESULTS UNAVAILABLE DUE TO INTERFERING SUBSTANCE 31.2* 31.2* 30.9* 32.9*  MCV RESULTS UNAVAILABLE DUE TO INTERFERING SUBSTANCE RESULTS UNAVAILABLE DUE TO INTERFERING SUBSTANCE RESULTS UNAVAILABLE DUE TO INTERFERING SUBSTANCE 96.9 95.7 100.0 100.6*  PLT 156 144* 143* 109* 126* 159 241  Basic Metabolic Panel: Recent Labs  Lab 02/25/24 0306 02/25/24 0630 02/25/24 1110 02/25/24 1347 02/26/24 0455 02/26/24 0654 02/27/24 1610 02/27/24 0820 02/27/24 1706 02/27/24 2024 02/28/24 0114 02/28/24 0447 02/28/24 2259 02/29/24 0423  NA 120*   < > 125*   < > 120*   < > 123*   < > 131* 129* 130* 127*  --  134*  K 3.0*  --  3.8  --  3.1*  --  3.1*  --   --   --   --  4.2  --  4.2  CL 76*  --  84*  --  88*  --  90*  --   --   --   --  99  --  101  CO2 26  --  27  --  25  --  27  --   --   --   --  23  --  26  GLUCOSE 80  --  80  --  101*  --  118*  --   --   --   --  93  --  96  BUN 5*  --  7  --  7  --  <5*  --   --   --   --  7  --  7  CREATININE 0.74  --  0.81  --  0.79  --  0.55*  --   --   --   --  0.73  --  0.79  CALCIUM 8.4*  --  8.3*  --  8.2*  --  8.2*  --   --   --   --  8.3*  --  9.0  MG 2.9*  --   --   --  1.6*  --  1.8  --   --   --   --  1.7  --  1.7  PHOS 2.6  --   --   --  1.7*  --  2.8  --   --   --   --  1.3* 2.7 3.3   < > = values in this interval not displayed.   GFR: Estimated Creatinine  Clearance: 120 mL/min (by C-G formula based on SCr of 0.79 mg/dL). Liver Function Tests: Recent Labs  Lab 02/24/24 2227 02/25/24 0306  AST 235* 173*  ALT 66* 52*  ALKPHOS 104 87  BILITOT 3.6* 3.2*  PROT 7.3 6.2*  ALBUMIN 4.2 3.6    Recent Labs  Lab 02/25/24 0432  AMMONIA 27   Coagulation Profile: Recent Labs  Lab 02/25/24 0630  INR 1.0   CBG: Recent Labs  Lab 02/27/24 1642 02/27/24 1937 02/27/24 2321 02/28/24 0337 02/28/24 0729  GLUCAP 115* 92 112* 106* 98   Anemia Panel: Recent Labs    02/27/24 0412  VITAMINB12 281  FOLATE 9.4   Sepsis Labs: Recent Labs  Lab 02/25/24 0019 02/25/24 0306 02/25/24 0630 02/25/24 1110 02/26/24 0455  PROCALCITON 0.69  --  0.91  --  0.63  LATICACIDVEN 5.3* 3.2*  --  0.7  --     Recent Results (from the past 240 hours)  MRSA Next Gen by PCR, Nasal     Status: None   Collection Time: 02/25/24 12:26 AM   Specimen: Nasal Mucosa; Nasal Swab  Result Value Ref Range Status   MRSA by PCR Next Gen NOT DETECTED NOT DETECTED Final    Comment: (NOTE) The GeneXpert MRSA Assay (FDA approved for NASAL specimens only), is one component of a  comprehensive MRSA colonization surveillance program. It is not intended to diagnose MRSA infection nor to guide or monitor treatment for MRSA infections. Test performance is not FDA approved in patients less than 66 years old. Performed at [facility], [address] [place], [place] [address]   Resp panel by RT-PCR (RSV, Flu A&B, Covid) Anterior Nasal Swab     Status: None   Collection Time: 02/25/24 12:59 AM   Specimen: Anterior Nasal Swab  Result Value Ref Range Status   SARS Coronavirus 2 by RT PCR NEGATIVE NEGATIVE Final    Comment: (NOTE) SARS-CoV-2 target nucleic acids are NOT DETECTED.  The SARS-CoV-2 RNA is generally detectable in upper respiratory specimens during the acute phase of infection. The lowest concentration of SARS-CoV-2 viral copies this assay can detect  is 138 copies/mL. A negative result does not preclude SARS-Cov-2 infection and should not be used as the sole basis for treatment or other patient management decisions. A negative result may occur with  improper specimen collection/handling, submission of specimen other than nasopharyngeal swab, presence of viral mutation(s) within the areas targeted by this assay, and inadequate number of viral copies(<138 copies/mL). A negative result must be combined with clinical observations, patient history, and epidemiological information. The expected result is Negative.  Fact Sheet for Patients:  BloggerCourse.com  Fact Sheet for Healthcare Providers:  SeriousBroker.it  This test is no t yet approved or cleared by the [place]  FDA and  has been authorized for detection and/or diagnosis of SARS-CoV-2 by FDA under an Emergency Use Authorization (EUA). This EUA will remain  in effect (meaning this test can be used) for the duration of the COVID-19 declaration under Section 564(b)(1) of the Act, 21 U.S.C.section 360bbb-3(b)(1), unless the authorization is terminated  or revoked sooner.       Influenza A by PCR NEGATIVE NEGATIVE Final   Influenza B by PCR NEGATIVE NEGATIVE Final    Comment: (NOTE) The Xpert Xpress SARS-CoV-2/FLU/RSV plus assay is intended as an aid in the diagnosis of influenza from Nasopharyngeal swab specimens and should not be used as a sole basis for treatment. Nasal washings and aspirates are unacceptable for Xpert Xpress SARS-CoV-2/FLU/RSV testing.  Fact Sheet for Patients: BloggerCourse.com  Fact Sheet for Healthcare Providers: SeriousBroker.it  This test is not yet approved or cleared by the [place]  FDA and has been authorized for detection and/or diagnosis of SARS-CoV-2 by FDA under an Emergency Use Authorization (EUA). This EUA will remain in effect  (meaning this test can be used) for the duration of the COVID-19 declaration under Section 564(b)(1) of the Act, 21 U.S.C. section 360bbb-3(b)(1), unless the authorization is terminated or revoked.     Resp Syncytial Virus by PCR NEGATIVE NEGATIVE Final    Comment: (NOTE) Fact Sheet for Patients: BloggerCourse.com  Fact Sheet for Healthcare Providers: SeriousBroker.it  This test is not yet approved or cleared by the [place]  FDA and has been authorized for detection and/or diagnosis of SARS-CoV-2 by FDA under an Emergency Use Authorization (EUA). This EUA will remain in effect (meaning this test can be used) for the duration of the COVID-19 declaration under Section 564(b)(1) of the Act, 21 U.S.C. section 360bbb-3(b)(1), unless the authorization is terminated or revoked.  Performed at [facility], [address] [place], [place] [address]   Gastrointestinal Panel by PCR , Stool     Status: None   Collection Time: 02/25/24  8:58 PM   Specimen: Stool  Result Value Ref Range Status  Campylobacter species NOT DETECTED NOT DETECTED Final   Plesimonas shigelloides NOT DETECTED NOT DETECTED Final   Salmonella species NOT DETECTED NOT DETECTED Final   Yersinia enterocolitica NOT DETECTED NOT DETECTED Final   Vibrio species NOT DETECTED NOT DETECTED Final   Vibrio cholerae NOT DETECTED NOT DETECTED Final   Enteroaggregative E coli (EAEC) NOT DETECTED NOT DETECTED Final   Enteropathogenic E coli (EPEC) NOT DETECTED NOT DETECTED Final   Enterotoxigenic E coli (ETEC) NOT DETECTED NOT DETECTED Final   Shiga like toxin producing E coli (STEC) NOT DETECTED NOT DETECTED Final   Shigella/Enteroinvasive E coli (EIEC) NOT DETECTED NOT DETECTED Final   Cryptosporidium NOT DETECTED NOT DETECTED Final   Cyclospora cayetanensis NOT DETECTED NOT DETECTED Final   Entamoeba histolytica NOT DETECTED NOT DETECTED Final   Giardia  lamblia NOT DETECTED NOT DETECTED Final   Adenovirus F40/41 NOT DETECTED NOT DETECTED Final   Astrovirus NOT DETECTED NOT DETECTED Final   Norovirus GI/GII NOT DETECTED NOT DETECTED Final   Rotavirus A NOT DETECTED NOT DETECTED Final   Sapovirus (I, II, IV, and V) NOT DETECTED NOT DETECTED Final    Comment: Performed at [facility], [address] [place], [place] [address]  C Difficile Quick Screen w PCR reflex     Status: None   Collection Time: 02/25/24  8:58 PM   Specimen: STOOL  Result Value Ref Range Status   C Diff antigen NEGATIVE NEGATIVE Final   C Diff toxin NEGATIVE NEGATIVE Final   C Diff interpretation No C. difficile detected.  Final    Comment: Performed at [facility], [address] [place], [place] [address]      Scheduled Meds:  feeding supplement  237 mL Oral TID BM   folic acid   1 mg Oral Daily   multivitamin with minerals  1 tablet Oral Daily   pantoprazole   40 mg Oral BID   PHENObarbital   65 mg Intravenous Q8H   Followed by   PHENObarbital   32.5 mg Intravenous Q8H   pneumococcal 20-valent conjugate vaccine  0.5 mL Intramuscular Tomorrow-1000   thiamine   100 mg Oral Daily   Or   thiamine   100 mg Intravenous Daily   Continuous Infusions:   LOS: 4 days   Time spent: 45 minutes  Unk Garb, DO  [facility]  02/29/2024, 8:43 AM

## 2024-02-29 NOTE — Discharge Summary (Signed)
 [facility] Phys[facility] Discharge Summary   Patient name: Nicholas Fletcher  Admit date:     02/24/2024  Discharge date: 02/29/2024  Attending Physician: Annitta Kindler [[id number]]  Discharge Physician: Unk Garb   PCP: System, Provider Not In  Admitted From: Home  Disposition:  Home  Recommendations for Outpatient Follow-up:  Follow up with PCP in 1-2 weeks Abstain from all alcohol use. Will need repeat LFTs in PCP office visit in 2 weeks  Home Health:No Equipment/Devices: None  Discharge Condition:Stable CODE STATUS:FULL Diet recommendation: Regular Fluid Restriction: None  Hospital Summary: HPI: 48 yo M presenting to [facility] ED from home for evaluation of altered mental status.   History obtained per chart review and patient/spouse bedside report. On 02/22/24 patient thought he had a "cold or allergies". They reported nausea/ vomiting and diarrhea with fatigue, headache, chills and productive cough. Over the last few days they also noticed poor food intake and that the patient was always thirsty. Spouse reports he drank "about a gallon of water" over the last 24 hours. He endorses dizziness, muscle cramping, tremors. They deny seizure like activity, blurred vision, falls/trauma, fevers, or congestion. His wife also noticed on 02/24/24 that the patient starting having confused, slow and stuttering speech. During this he has continued to drink alcohol. He normally drinks 4-6 shots of liquor daily. On 5/3 he had 4 shots, on 5/4 he had 2 shots and only one beer on 5/5 around noon.   Of note patient had been admitted multiple times in 2023 & 2024 for hyponatremia and ETOH withdrawal with seizure activity. He was seen outpatient by a neurologist in 08/2023 and underwent an EEG, sleep EEG and MRI brain. He was not started on any AED. He was started on Lamictal  in 08/2023 by a psychiatrist for mood stability which he reports no longer taking. He denies recreational drug use, but does  report daily vaping. 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 Possible sepsis s/t unknown etiology. Na overcorrectd to 126 requiring multiple doses of Desmopressin  and starting D5W.  02/26/2024: Sodium at goal of 120. Complaining of blurry vision nausea and vomiting, Hypert saline 100cc bolus will be given. D5 and NS discontinued 02-28-2024 Care transferred to [facility]  Significant Labs: WBC 3.8, HgB 15.6, plt 156 Na 113, K 2.8, Cl 67, CO2 of 22, BUN 5, Scr 0.65, glu 83 Mg 1.6 UDS negative Procal 0.69 Serum osm 260, urine osm 195 Urine Na 22  Significant Imaging Studies: CXR No active cardiopulmonary disease.  CT head No acute intracranial abnormality.  CT abd/pelvis Diffuse fatty infiltration of the liver. Cholelithiasis. Mildly enlarged prostate. No acute findings   Antibiotic Therapy: Anti-infectives (From admission, onward)    Start     Dose/Rate Route Frequency Ordered Stop   02/25/24 0315  piperacillin -tazobactam (ZOSYN ) IVPB 3.375 g  Status:  Discontinued        3.375 g 12.5 mL/hr over 240 Minutes Intravenous Every 8 hours 02/25/24 0224 02/25/24 1054       Procedures: 02-26-2024 PICC line  Consultants: PCCM - admitting service   Hospital Course by Problem: * Acute hyponatremia 02-28-2024 TRH assumed care.  Na 127. Pt on regular diet. No further need for IVF. Hyponatremia due to hypovolemia, poor salt intake and chronic etoh abuse. Not SIADH. Will improve over time.  Transfer to med/surg  bed.  02-29-2024 stable for DC. Na up to 134 today. Resolved hyponatremia  Alcohol withdrawal (HCC) 02-28-2024 completed etoh withdrawal. on phenobarb taper. No further need for CIWA score. Potential for DC tomorrow. Has been without ETOH for over 1 week now. Risk of etoh withdrawal seizure  is minimal.  02-29-2024 complete phenobarb taper at home. Pt advised he is to absolutely abstain from ALL etoh intake.  Acute metabolic encephalopathy-resolved as of 02/29/2024 02-28-2024 likely due to acute hyponatremia. Resolved.  ETOH abuse 02-28-2024 on phenobarb taper. No further need for CIWA score. Potential for DC tomorrow. Has been without ETOH for over 1 week now. Risk of etoh withdrawal seizure is minimal.  02-29-2024 complete phenobarb taper at home. Pt advised he is to absolutely abstain from ALL etoh intake.   Hypophosphatemia-resolved as of 02/29/2024 02-28-2024 getting IV replacement today.  Hypokalemia-resolved as of 02/29/2024 02-28-2024 repleted and resolved.  Elevated LFTs 02-29-2024 likely due to etoh abuse. Will need repeat LFTs at PCP f/u appointment in 2 weeks.    Discharge Diagnoses:  Principal Problem:   Acute hyponatremia Active Problems:   Alcohol withdrawal (HCC)   ETOH abuse   Elevated LFTs   Discharge Instructions  Discharge Instructions     Call MD for:  extreme fatigue   Complete by: As directed    Call MD for:  persistant dizziness or light-headedness   Complete by: As directed    Call MD for:  persistant nausea and vomiting   Complete by: As directed    Call MD for:  severe uncontrolled pain   Complete by: As directed    Call MD for:  temperature >100.4   Complete by: As directed    Diet general   Complete by: As directed    Discharge instructions   Complete by: As directed    1. Follow up with your primary care provider in 1-2 weeks following discharge from hospital. 2. Abstain from ALL alcohol use. This includes beer, wine, liquor, etc.   Increase activity slowly   Complete by: As directed       Allergies as of 02/29/2024       Reactions   Chlordiazepoxide  Other (See Comments)   unknown   Trazodone  Other (See Comments)   hallucinations        Medication List     TAKE these medications    folic acid  1 MG  tablet Commonly known as: FOLVITE  Take 1 tablet (1 mg total) by mouth daily. Start taking on: Mar 01, 2024   multivitamin with minerals Tabs tablet Take 1 tablet by mouth daily. Start taking on: Mar 01, 2024   ondansetron  4 MG tablet Commonly known as: Zofran  Take 1 tablet (4 mg total) by mouth every 6 (six) hours as needed for nausea or vomiting.   pantoprazole  40 MG tablet Commonly known as: PROTONIX  Take 1 tablet (40 mg total) by mouth 2 (two) times daily.   PHENObarbital  30 MG tablet Commonly known as: LUMINAL Take 1 tablet (30 mg total) by mouth 3 (three) times daily for 1 day, THEN 1 tablet (30 mg total) 2 (two) times daily for 1 day, THEN 0.5 tablets (15 mg total) 2 (two) times daily for 1 day. Start taking on: Feb 29, 2024   thiamine  100 MG tablet Commonly known as: Vitamin B-1 Take 1 tablet (100 mg total) by mouth daily. Start taking on: Mar 01, 2024        Allergies  Allergen Reactions   Chlordiazepoxide  Other (See Comments)  unknown   Trazodone  Other (See Comments)    hallucinations    Discharge Exam: Vitals:   02/29/24 0320 02/29/24 0745  BP: 118/72 (!) 136/98  Pulse: 80 77  Resp:    Temp: 98.5 F (36.9 C) 98 F (36.7 C)  SpO2: 100% 100%    Physical Exam Vitals and nursing note reviewed.  Constitutional:      General: He is not in acute distress.    Appearance: He is normal weight. He is not toxic-appearing or diaphoretic.  HENT:     Head: Normocephalic and atraumatic.     Nose: Nose normal.  Cardiovascular:     Rate and Rhythm: Normal rate and regular rhythm.  Pulmonary:     Effort: Pulmonary effort is normal.     Breath sounds: Normal breath sounds.  Abdominal:     General: Abdomen is flat. Bowel sounds are normal.     Palpations: Abdomen is soft.  Musculoskeletal:     Right lower leg: No edema.     Left lower leg: No edema.  Skin:    General: Skin is warm and dry.     Capillary Refill: Capillary refill takes less than 2 seconds.   Neurological:     General: No focal deficit present.     Mental Status: He is alert and oriented to person, place, and time.     The results of significant diagnostics from this hospitalization (including imaging, microbiology, ancillary and laboratory) are listed below for reference.    Microbiology: Recent Results (from the past 240 hours)  MRSA Next Gen by PCR, Nasal     Status: None   Collection Time: 02/25/24 12:26 AM   Specimen: Nasal Mucosa; Nasal Swab  Result Value Ref Range Status   MRSA by PCR Next Gen NOT DETECTED NOT DETECTED Final    Comment: (NOTE) The GeneXpert MRSA Assay (FDA approved for NASAL specimens only), is one component of a comprehensive MRSA colonization surveillance program. It is not intended to diagnose MRSA infection nor to guide or monitor treatment for MRSA infections. Test performance is not FDA approved in patients less than 83 years old. Performed at [facility], [address] [place], [place] [address]   Resp panel by RT-PCR (RSV, Flu A&B, Covid) Anterior Nasal Swab     Status: None   Collection Time: 02/25/24 12:59 AM   Specimen: Anterior Nasal Swab  Result Value Ref Range Status   SARS Coronavirus 2 by RT PCR NEGATIVE NEGATIVE Final    Comment: (NOTE) SARS-CoV-2 target nucleic acids are NOT DETECTED.  The SARS-CoV-2 RNA is generally detectable in upper respiratory specimens during the acute phase of infection. The lowest concentration of SARS-CoV-2 viral copies this assay can detect is 138 copies/mL. A negative result does not preclude SARS-Cov-2 infection and should not be used as the sole basis for treatment or other patient management decisions. A negative result may occur with  improper specimen collection/handling, submission of specimen other than nasopharyngeal swab, presence of viral mutation(s) within the areas targeted by this assay, and inadequate number of viral copies(<138 copies/mL). A negative result must be  combined with clinical observations, patient history, and epidemiological information. The expected result is Negative.  Fact Sheet for Patients:  BloggerCourse.com  Fact Sheet for Healthcare Providers:  SeriousBroker.it  This test is no t yet approved or cleared by the [place]  FDA and  has been authorized for detection and/or diagnosis of SARS-CoV-2 by FDA under an Emergency Use Authorization (EUA). This  EUA will remain  in effect (meaning this test can be used) for the duration of the COVID-19 declaration under Section 564(b)(1) of the Act, 21 U.S.C.section 360bbb-3(b)(1), unless the authorization is terminated  or revoked sooner.       Influenza A by PCR NEGATIVE NEGATIVE Final   Influenza B by PCR NEGATIVE NEGATIVE Final    Comment: (NOTE) The Xpert Xpress SARS-CoV-2/FLU/RSV plus assay is intended as an aid in the diagnosis of influenza from Nasopharyngeal swab specimens and should not be used as a sole basis for treatment. Nasal washings and aspirates are unacceptable for Xpert Xpress SARS-CoV-2/FLU/RSV testing.  Fact Sheet for Patients: BloggerCourse.com  Fact Sheet for Healthcare Providers: SeriousBroker.it  This test is not yet approved or cleared by the [place]  FDA and has been authorized for detection and/or diagnosis of SARS-CoV-2 by FDA under an Emergency Use Authorization (EUA). This EUA will remain in effect (meaning this test can be used) for the duration of the COVID-19 declaration under Section 564(b)(1) of the Act, 21 U.S.C. section 360bbb-3(b)(1), unless the authorization is terminated or revoked.     Resp Syncytial Virus by PCR NEGATIVE NEGATIVE Final    Comment: (NOTE) Fact Sheet for Patients: BloggerCourse.com  Fact Sheet for Healthcare Providers: SeriousBroker.it  This test is not yet  approved or cleared by the [place]  FDA and has been authorized for detection and/or diagnosis of SARS-CoV-2 by FDA under an Emergency Use Authorization (EUA). This EUA will remain in effect (meaning this test can be used) for the duration of the COVID-19 declaration under Section 564(b)(1) of the Act, 21 U.S.C. section 360bbb-3(b)(1), unless the authorization is terminated or revoked.  Performed at [facility], [address] [place], [place] [address]   Gastrointestinal Panel by PCR , Stool     Status: None   Collection Time: 02/25/24  8:58 PM   Specimen: Stool  Result Value Ref Range Status   Campylobacter species NOT DETECTED NOT DETECTED Final   Plesimonas shigelloides NOT DETECTED NOT DETECTED Final   Salmonella species NOT DETECTED NOT DETECTED Final   Yersinia enterocolitica NOT DETECTED NOT DETECTED Final   Vibrio species NOT DETECTED NOT DETECTED Final   Vibrio cholerae NOT DETECTED NOT DETECTED Final   Enteroaggregative E coli (EAEC) NOT DETECTED NOT DETECTED Final   Enteropathogenic E coli (EPEC) NOT DETECTED NOT DETECTED Final   Enterotoxigenic E coli (ETEC) NOT DETECTED NOT DETECTED Final   Shiga like toxin producing E coli (STEC) NOT DETECTED NOT DETECTED Final   Shigella/Enteroinvasive E coli (EIEC) NOT DETECTED NOT DETECTED Final   Cryptosporidium NOT DETECTED NOT DETECTED Final   Cyclospora cayetanensis NOT DETECTED NOT DETECTED Final   Entamoeba histolytica NOT DETECTED NOT DETECTED Final   Giardia lamblia NOT DETECTED NOT DETECTED Final   Adenovirus F40/41 NOT DETECTED NOT DETECTED Final   Astrovirus NOT DETECTED NOT DETECTED Final   Norovirus GI/GII NOT DETECTED NOT DETECTED Final   Rotavirus A NOT DETECTED NOT DETECTED Final   Sapovirus (I, II, IV, and V) NOT DETECTED NOT DETECTED Final    Comment: Performed at [facility], [address] [place], [place] [address]  C Difficile Quick Screen w PCR reflex     Status: None    Collection Time: 02/25/24  8:58 PM   Specimen: STOOL  Result Value Ref Range Status   C Diff antigen NEGATIVE NEGATIVE Final   C Diff toxin NEGATIVE NEGATIVE Final   C Diff interpretation No C. difficile detected.  Final    Comment: Performed at [facility], [address] [place], [place] [address]     Labs: Basic Metabolic Panel: Recent Labs  Lab 02/25/24 0306 02/25/24 0630 02/25/24 1110 02/25/24 1347 02/26/24 0455 02/26/24 0654 02/27/24 1914 02/27/24 0820 02/27/24 1706 02/27/24 2024 02/28/24 0114 02/28/24 0447 02/28/24 2259 02/29/24 0423  NA 120*   < > 125*   < > 120*   < > 123*   < > 131* 129* 130* 127*  --  134*  K 3.0*  --  3.8  --  3.1*  --  3.1*  --   --   --   --  4.2  --  4.2  CL 76*  --  84*  --  88*  --  90*  --   --   --   --  99  --  101  CO2 26  --  27  --  25  --  27  --   --   --   --  23  --  26  GLUCOSE 80  --  80  --  101*  --  118*  --   --   --   --  93  --  96  BUN 5*  --  7  --  7  --  <5*  --   --   --   --  7  --  7  CREATININE 0.74  --  0.81  --  0.79  --  0.55*  --   --   --   --  0.73  --  0.79  CALCIUM 8.4*  --  8.3*  --  8.2*  --  8.2*  --   --   --   --  8.3*  --  9.0  MG 2.9*  --   --   --  1.6*  --  1.8  --   --   --   --  1.7  --  1.7  PHOS 2.6  --   --   --  1.7*  --  2.8  --   --   --   --  1.3* 2.7 3.3   < > = values in this interval not displayed.   Liver Function Tests: Recent Labs  Lab 02/24/24 2227 02/25/24 0306  AST 235* 173*  ALT 66* 52*  ALKPHOS 104 87  BILITOT 3.6* 3.2*  PROT 7.3 6.2*  ALBUMIN 4.2 3.6    Recent Labs  Lab 02/25/24 0432  AMMONIA 27   CBC: Recent Labs  Lab 02/24/24 2227 02/25/24 0058 02/25/24 0306 02/26/24 0455 02/27/24 0412 02/28/24 0447 02/29/24 0423  WBC 3.8* 4.2 3.6* 2.6* 3.8* 3.8* 4.5  NEUTROABS 1.9 2.4  --   --   --   --   --   HGB 15.6 12.8* 13.0 11.4* 11.7* 11.1* 11.6*  HCT RESULTS UNAVAILABLE DUE TO INTERFERING SUBSTANCE RESULTS UNAVAILABLE DUE TO INTERFERING  SUBSTANCE RESULTS UNAVAILABLE DUE TO INTERFERING SUBSTANCE 31.2* 31.2* 30.9* 32.9*  MCV RESULTS UNAVAILABLE DUE TO INTERFERING SUBSTANCE RESULTS UNAVAILABLE DUE TO INTERFERING SUBSTANCE RESULTS UNAVAILABLE DUE TO INTERFERING SUBSTANCE 96.9 95.7 100.0 100.6*  PLT 156 144* 143* 109* 126* 159 241   CBG: Recent Labs  Lab 02/27/24 1642 02/27/24 1937 02/27/24 2321 02/28/24 0337 02/28/24 0729  GLUCAP 115* 92 112* 106* 98   Anemia work up Recent Labs    02/27/24 0412  VITAMINB12 281  FOLATE 9.4   Urinalysis  Component Value Date/Time   COLORURINE YELLOW (A) 02/25/2024 0019   APPEARANCEUR CLEAR (A) 02/25/2024 0019   LABSPEC 1.006 02/25/2024 0019   PHURINE 7.0 02/25/2024 0019   GLUCOSEU NEGATIVE 02/25/2024 0019   HGBUR SMALL (A) 02/25/2024 0019   BILIRUBINUR NEGATIVE 02/25/2024 0019   KETONESUR 20 (A) 02/25/2024 0019   PROTEINUR NEGATIVE 02/25/2024 0019   NITRITE NEGATIVE 02/25/2024 0019   LEUKOCYTESUR NEGATIVE 02/25/2024 0019   Sepsis Labs Recent Labs  Lab 02/25/24 0019 02/25/24 0058 02/25/24 0630 02/26/24 0455 02/27/24 0412 02/28/24 0447 02/29/24 0423  PROCALCITON 0.69  --  0.91 0.63  --   --   --   WBC  --    < >  --  2.6* 3.8* 3.8* 4.5   < > = values in this interval not displayed.    Procedures/Studies: US  EKG SITE RITE Result Date: 02/26/2024 If Site Rite image not attached, placement could not be confirmed due to current cardiac rhythm.  CT ABDOMEN PELVIS WO CONTRAST Result Date: 02/25/2024 CLINICAL DATA:  Sepsis EXAM: CT ABDOMEN AND PELVIS WITHOUT CONTRAST TECHNIQUE: Multidetector CT imaging of the abdomen and pelvis was performed following the standard protocol without IV contrast. RADIATION DOSE REDUCTION: This exam was performed according to the departmental dose-optimization program which includes automated exposure control, adjustment of the mA and/or kV according to patient size and/or use of iterative reconstruction technique. COMPARISON:  09/24/2021  FINDINGS: Lower chest: No acute findings Hepatobiliary: Layering gallstones within the gallbladder. Diffuse low-density throughout the liver compatible fatty infiltration. No focal abnormality or biliary ductal dilatation. Pancreas: No focal abnormality or ductal dilatation. Spleen: No focal abnormality.  Normal size. Adrenals/Urinary Tract: No adrenal abnormality. No focal renal abnormality. No stones or hydronephrosis. Urinary bladder is unremarkable. Stomach/Bowel: Stomach, large and small bowel grossly unremarkable. No bowel obstruction or inflammatory process. Normal appendix. Vascular/Lymphatic: No evidence of aneurysm or adenopathy. Reproductive: Mildly prominent prostate Other: No free fluid or free air. Musculoskeletal: No acute bony abnormality. IMPRESSION: Diffuse fatty infiltration of the liver. Cholelithiasis. Mildly enlarged prostate. No acute findings. Electronically Signed   By: Janeece Mechanic M.D.   On: 02/25/2024 01:40   CT HEAD WO CONTRAST ( ) Result Date: 02/24/2024 CLINICAL DATA:  Mental status change, unknown cause EXAM: CT HEAD WITHOUT CONTRAST TECHNIQUE: Contiguous axial images were obtained from the base of the skull through the vertex without intravenous contrast. RADIATION DOSE REDUCTION: This exam was performed according to the departmental dose-optimization program which includes automated exposure control, adjustment of the mA and/or kV according to patient size and/or use of iterative reconstruction technique. COMPARISON:  08/26/2023 FINDINGS: Brain: No acute intracranial abnormality. Specifically, no hemorrhage, hydrocephalus, mass lesion, acute infarction, or significant intracranial injury. Vascular: No hyperdense vessel or unexpected calcification. Skull: No acute calvarial abnormality. Sinuses/Orbits: No acute findings Other: None IMPRESSION: No acute intracranial abnormality. Electronically Signed   By: Janeece Mechanic M.D.   On: 02/24/2024 22:58   DG Chest Port 1 View Result  Date: 02/24/2024 CLINICAL DATA:  Chest pain EXAM: PORTABLE CHEST 1 VIEW COMPARISON:  08/26/2023 FINDINGS: Heart and mediastinal contours are within normal limits. No focal opacities or effusions. No acute bony abnormality. No pneumothorax. IMPRESSION: No active cardiopulmonary disease. Electronically Signed   By: Janeece Mechanic M.D.   On: 02/24/2024 22:50    Time coordinating discharge: 50 mins  SIGNED:  Unk Garb, DO [facility] 02/29/24, 8:49 AM

## 2024-02-29 NOTE — Plan of Care (Signed)

## 2024-02-29 NOTE — TOC Transition Note (Signed)
 Transition of Care [facility]) - Discharge Note   Patient Details  Name: Nicholas Fletcher MRN: [id number] Date of Birth: Mar 22, 1976  Transition of [facility] CM/SW Contact:  Holland Lundborg, RN Phone Number: 02/29/2024, 11:01 AM   Clinical Narrative:     Notified that patient would need transportation assistance home.  Patient confirms he has his house keys, address confirmed.  Emmit Harold called 4 times over the course of an hour, no answer.  [employer] called, will pick up patient within the next 30 minutes.  Bedside nurse notified, taxi voucher provided.   Final next level of care: Home/Self Care Barriers to Discharge: Barriers Resolved   Patient Goals and CMS Choice            Discharge Placement                       Discharge Plan and Services Additional resources added to the After Visit Summary for                                       Social Drivers of Health (SDOH) Interventions SDOH Screenings   Food Insecurity: No Food Insecurity (02/25/2024)  Housing: Low Risk  (02/25/2024)  Transportation Needs: No Transportation Needs (02/25/2024)  Utilities: Not At Risk (02/25/2024)  Alcohol Screen: Medium Risk (07/05/2023)  Depression (PHQ2-9): Medium Risk (09/18/2023)  Social Connections: Unknown (03/06/2022)   Received from [facility]  Tobacco Use: High Risk (02/24/2024)     Readmission Risk Interventions     No data to display

## 2024-02-29 NOTE — Progress Notes (Signed)
 Patient is alert and oriented X 4. Discharge instruction given. No any questions at this time. Peripheral I/v removed.

## 2024-02-29 NOTE — Assessment & Plan Note (Signed)
 02-29-2024 likely due to etoh abuse. Will need repeat LFTs at PCP f/u appointment in 2 weeks.

## 2024-03-02 ENCOUNTER — Telehealth (HOSPITAL_COMMUNITY): Payer: Self-pay | Admitting: Psychiatry

## 2024-03-03 ENCOUNTER — Encounter (HOSPITAL_COMMUNITY): Payer: Self-pay

## 2024-03-03 ENCOUNTER — Telehealth (HOSPITAL_COMMUNITY): Payer: Self-pay

## 2024-03-03 NOTE — Telephone Encounter (Signed)
 Therapist receives an in [employer] from Sheppard Dickinson at the [facility] office saying  [name] is trying to reach this therapist.  Therapist  outreaches Nicholas Fletcher via phone, however therapist reached Voice Mail and the voice mail is not set up.  Therapist will send a My Chart letter to him.  [name], MS, LMFT, LCAS

## 2024-03-11 ENCOUNTER — Telehealth (HOSPITAL_COMMUNITY): Payer: Self-pay | Admitting: Licensed Clinical Social Worker

## 2024-03-11 NOTE — Telephone Encounter (Signed)
 The therapist receives a call from [place] confirming his identity via two identifiers and schedules him for an initial therapy visit on 03/12/24 at 11 a.m.  Melynda Stagger, MA, LCSW, [facility], LCAS 03/11/2024

## 2024-03-12 ENCOUNTER — Ambulatory Visit (HOSPITAL_COMMUNITY): Payer: PRIVATE HEALTH INSURANCE | Admitting: Licensed Clinical Social Worker

## 2024-03-12 ENCOUNTER — Ambulatory Visit (INDEPENDENT_AMBULATORY_CARE_PROVIDER_SITE_OTHER): Payer: PRIVATE HEALTH INSURANCE | Admitting: Licensed Clinical Social Worker

## 2024-03-12 DIAGNOSIS — F1020 Alcohol dependence, uncomplicated: Secondary | ICD-10-CM | POA: Diagnosis not present

## 2024-03-12 NOTE — Progress Notes (Signed)
 THERAPIST PROGRESS NOTE  Session Time: 2:10 p.m. to 3:06 p.m.   Type of Therapy: Individual   Therapist Response/Interventions: Solution Focused/the therapist educates Nicholas Fletcher about postacute withdrawal syndrome and the need for a sober support system encouraging him to start attending Alcoholics Anonymous meetings.  The therapist also emphasizes the importance of avoiding people, places, and things, associated with alcohol.    Treatment Goals addressed:  Active     Substance Use     Nicholas Fletcher will abstain completely from alcohol per self-report and random breathalyzers as indicated while beginning to attend some Alcoholics Anonymous meetings so as to connect with sober supports.     Start:  03/13/24    Expected End:  09/13/24         The therapist will assist Nicholas Fletcher in being able to identify and avoid triggers for drinking alcohol while helping him to overcome any barriers or obstacles he encounters and developing sober supports.     Start:  03/13/24            Summary: Nicholas Fletcher presents today for his initial appointment with his therapist.  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  He says that he went to school for Psychologist, counselling, got married, and had kids.  He says that he has been drinking alcohol since his mid teens saying that it started out mostly on weekends but has progressed over the years and that it grew "exponentially" during COVID.  Due to his drinking, he says that he has been hospitalized twice because of seizures and vitamin D deficiency.  He says that he has not touched a drop of alcohol since leaving the hospital a couple of weeks ago.  Prior to going to the hospital, he says that he could not go 2 or 3 days without drinking before he would develop a tremor.  Currently, he says that he feels like he has lost the taste for alcohol however, he has  concerns about possibly returning to drinking saying that his mood will be okay but will tend to "flatten out" during the middle of the day.  He admits that his highest risk for drinking is likely in the late evenings when he is by himself.   When asked about a family history for addiction, he says that his mother and father both had addiction issues.  He says that his mother would drink a case of beer a day and smokes 2 packs of cigarettes per day while his father was addicted to cocaine.    By the conclusion of the session, Nicholas Fletcher says that he is interested in medication assisted treatment and that he would like to attend 1 more appointment with this therapist before possibly starting the substance abuse intensive outpatient treatment program.  He is agreeable to attending some virtual meetings in the interim and is given a link to [email or web address].   Progress Towards Goals: Initial  Suicidal/Homicidal: No SI or HI  Plan: Return again in 2 weeks.  Diagnosis: Alcohol use disorder, severe  Collaboration of Care: Other N/A  Patient/Guardian was advised Release of Information must be obtained prior to any record release in order to collaborate their care with an outside provider. Patient/Guardian was advised if they have not already done so to contact the registration department to sign all necessary forms in order for us  to release information regarding their care.  Consent: Patient/Guardian gives verbal consent for treatment and assignment of benefits for services provided during this visit. Patient/Guardian expressed understanding and agreed to proceed.   Melynda Stagger, MA, LCSW, [facility], LCAS 03/12/2024

## 2024-03-13 ENCOUNTER — Encounter (HOSPITAL_COMMUNITY): Payer: Self-pay

## 2024-03-17 ENCOUNTER — Telehealth (HOSPITAL_COMMUNITY): Payer: Self-pay | Admitting: Licensed Clinical Social Worker

## 2024-03-17 ENCOUNTER — Encounter (HOSPITAL_COMMUNITY): Payer: Self-pay | Admitting: Licensed Clinical Social Worker

## 2024-03-17 NOTE — Telephone Encounter (Signed)
 The therapist received a message from Nicholas Fletcher saying that he cannot get in with his primary care doctor until July and asks about saying a physician in the [facility] office for medication assisted treatment.  The therapist attempts to reach Nicholas Fletcher; however, he is unable to leave a HIPAA compliant voicemail as Nicholas Fletcher has a voicemail which has not been set up yet.  Thus, the therapist sends Nicholas Fletcher correspondence through [employer].  The therapist will speak with the PA-C hopefully tomorrow to see if he is able to see Nicholas Fletcher on an individual basis to prescribe medication assisted treatment for alcohol.  Melynda Stagger, MA, LCSW, [facility], LCAS 03/17/2024

## 2024-03-18 ENCOUNTER — Ambulatory Visit (HOSPITAL_COMMUNITY): Admitting: Medical

## 2024-03-18 NOTE — Telephone Encounter (Signed)
 Note in error.

## 2024-03-23 ENCOUNTER — Telehealth (HOSPITAL_COMMUNITY): Payer: Self-pay | Admitting: Licensed Clinical Social Worker

## 2024-03-23 ENCOUNTER — Encounter (HOSPITAL_COMMUNITY): Payer: Self-pay | Admitting: Medical

## 2024-03-23 ENCOUNTER — Other Ambulatory Visit: Payer: Self-pay

## 2024-03-23 ENCOUNTER — Other Ambulatory Visit (HOSPITAL_COMMUNITY): Payer: Self-pay | Admitting: Medical

## 2024-03-23 ENCOUNTER — Ambulatory Visit (HOSPITAL_COMMUNITY): Admitting: Medical

## 2024-03-23 VITALS — BP 134/78 | HR 108 | Ht 72.0 in | Wt 180.0 lb

## 2024-03-23 DIAGNOSIS — F1020 Alcohol dependence, uncomplicated: Secondary | ICD-10-CM

## 2024-03-23 DIAGNOSIS — F418 Other specified anxiety disorders: Secondary | ICD-10-CM | POA: Diagnosis not present

## 2024-03-23 DIAGNOSIS — F19939 Other psychoactive substance use, unspecified with withdrawal, unspecified: Secondary | ICD-10-CM

## 2024-03-23 DIAGNOSIS — R7309 Other abnormal glucose: Secondary | ICD-10-CM

## 2024-03-23 DIAGNOSIS — K7010 Alcoholic hepatitis without ascites: Secondary | ICD-10-CM | POA: Diagnosis not present

## 2024-03-23 DIAGNOSIS — R569 Unspecified convulsions: Secondary | ICD-10-CM

## 2024-03-23 DIAGNOSIS — Z6372 Alcoholism and drug addiction in family: Secondary | ICD-10-CM | POA: Diagnosis not present

## 2024-03-23 DIAGNOSIS — T7602XS Child neglect or abandonment, suspected, sequela: Secondary | ICD-10-CM

## 2024-03-23 DIAGNOSIS — R1011 Right upper quadrant pain: Secondary | ICD-10-CM

## 2024-03-23 DIAGNOSIS — R519 Headache, unspecified: Secondary | ICD-10-CM

## 2024-03-23 MED ORDER — BACLOFEN 10 MG PO TABS: 10.0000 mg | ORAL_TABLET | Freq: Three times a day (TID) | ORAL | 1 refills | Status: DC

## 2024-03-23 MED ORDER — BUPROPION HCL ER (SR) 100 MG PO TB12: 100.0000 mg | ORAL_TABLET | Freq: Two times a day (BID) | ORAL | 2 refills | Status: DC
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
## 2024-03-23 NOTE — Telephone Encounter (Signed)
 The therapist received a voicemail from [place] asking for confirmation concerning his appointment with Mr. Andria Banks, PA-C today at 3 PM as well as his appointment with this therapist tomorrow.  By the time this therapist is able to review this voicemail, it is approximately 5 minutes before 3 PM.  The therapist confirms via epic that Kadarius did arrive for his appointment today with Mr. Juleen Oakland such that the reception desk will be able to confirm his appointment with this therapist for tomorrow upon check-in or checkout.  Consequently, the therapist will defer returning this telephone call as it is no longer necessary.  Melynda Stagger, MA, LCSW, [facility], LCAS 03/23/2024

## 2024-03-24 ENCOUNTER — Ambulatory Visit (HOSPITAL_COMMUNITY): Admitting: Licensed Clinical Social Worker

## 2024-03-30 ENCOUNTER — Telehealth (HOSPITAL_COMMUNITY): Payer: Self-pay | Admitting: Licensed Clinical Social Worker

## 2024-03-30 NOTE — Telephone Encounter (Signed)
 The therapist returns Eathen's call verifying his identity via 2 identifiers.  Estill is under the impression that his appointment with this therapist is today at 1 PM.  The therapist informs Mantaj that this appointment is actually tomorrow.  Vong requests that tomorrow's appointment be changed from in person to virtual.  Melynda Stagger, MA, LCSW, [facility], LCAS 03/30/2024

## 2024-03-31 ENCOUNTER — Ambulatory Visit (INDEPENDENT_AMBULATORY_CARE_PROVIDER_SITE_OTHER): Payer: PRIVATE HEALTH INSURANCE | Admitting: Licensed Clinical Social Worker

## 2024-03-31 DIAGNOSIS — F418 Other specified anxiety disorders: Secondary | ICD-10-CM

## 2024-03-31 DIAGNOSIS — Z7289 Other problems related to lifestyle: Secondary | ICD-10-CM

## 2024-03-31 DIAGNOSIS — F1020 Alcohol dependence, uncomplicated: Secondary | ICD-10-CM | POA: Diagnosis not present

## 2024-03-31 NOTE — Progress Notes (Signed)
 THERAPIST PROGRESS NOTE  Session Time: 1 p.m. to 2:02 p.m.    Virtual Visit via Video Note   I connected with  Nicholas Fletcher at 1 p.m. EST by a video enabled telemedicine application and verified that I am speaking with the correct person using two identifiers.   Location: Patient: home Provider: [facility] office   I discussed the limitations of evaluation and management by telemedicine and the availability of in person appointments. The patient expressed understanding and agreed to proceed.  Type of Therapy: Individual   Therapist Response/Interventions: CBT and solution focused/the therapist addresses some of Nicholas Fletcher's irrational thinking saying that he fears being a less fun while not drinking which is not congruent with the feedback he has received from his wife and adult children and others in his life who are not an active addiction.  The therapist does disclose his belief that people who are in active addiction and Nicholas Fletcher's life may not like the new version of him but Nicholas Fletcher needs to put his focus on the sober people and sober supports in his life and not those in the throes of the disease of addiction.  The therapist informs Nicholas Fletcher that he needs to organize his personal life around his recovery and not organize his recovery around his personal life.  In response to Nicholas Fletcher's tendency to put the needs of others before his own, the therapist talks to him about the saying in AA that a person needs to be "selfish" in his or her own recovery.  The therapist points out the fact that if Nicholas Fletcher does not take care of his recovery that he will not be in any place to be of benefit others.  He reminds Nicholas Fletcher of his appointment with the PA-C on Monday.  Treatment Goals addressed:  Active     Substance Use     Nicholas Fletcher will abstain completely from alcohol per self-report and random breathalyzers as indicated while beginning to attend some Alcoholics Anonymous meetings so as to connect with sober supports.  (Progressing)     Start:  03/13/24    Expected End:  09/13/24         The therapist will assist Nicholas Fletcher in being able to identify and avoid triggers for drinking alcohol while helping him to overcome any barriers or obstacles he encounters and developing sober supports.     Start:  03/13/24               Summary: Nicholas Fletcher returns saying that he picked up the Nicholas Fletcher  but no the Nicholas Fletcher  as it has a notice to refer back to the physician. He has been taking the Nicholas Fletcher  at night saying that he gets a full night sleep and does not feel the stress or tension next day. He says that he has a "lighter mood" during the day when he takes the Nicholas Fletcher . He is only taking one, 100 mg pill per day.   He says that when he has a thought of drinking that a sparkling water and/or some chips will take it away. He rates an urge to drink as a "3" on a scale of 1-10 with 10 being most severe. He says that he has to find time to fill time slots as it leaves "voids" during the day when he does not drink.   He denies any alcohol use since he last met with this therapist. When he went fishing last week, he heard himself say that one beer wouldn't hurt but then told himself that it  would.  Nicholas Fletcher says that pretty much all his buddies or friends for life "kind of support" him as they say "it's more for me." They say that they need to stop with two of his buddies having actually stopped with both having stopped for a year or more.  He says that he attended meetings a "couple of times" but due to lack of time has not attended in a week or more. He says that the PA-C gave him a book on children of alcoholics which he has found helpful.  Nicholas Fletcher says that he learned from the meetings that he attended to no longer blame external things for his drinking. Nicholas Fletcher says that another thing that he needs to cut out is vaping and is now vaping non-nicotine  vape. He says, "the smoke calms me down" as he can feel himself "getting  anxious at times."   He says that what he wants to focus on is how his employees will see him if they do not see him as the "fun guy." He questions who he is without alcohol noting that it was 70% of his identity.  Nicholas Fletcher eventually comes to the conclusion that he has no idea what his employees are thinking but that he is projecting his own thoughts on to him.  By the conclusion of the session, he says that he will start attending meetings as what this therapist told him thus far has worked for him.  He asks if this therapist can provide him with bullet points of the key things discussed so that he can share them with his wife.  Moving forward, he will write down Nicholas Fletcher from his therapy sessions and 12-step meetings to be able to retain and share as needed.  Progress Towards Goals: Initial  Suicidal/Homicidal: No SI or HI  Plan: Return again in 2 weeks.  Diagnosis: Alcohol use disorder, severe  Collaboration of Care: Other N/A  Patient/Guardian was advised Release of Information must be obtained prior to any record release in order to collaborate their care with an outside provider. Patient/Guardian was advised if they have not already done so to contact the registration department to sign all necessary forms in order for us  to release information regarding their care.   Consent: Patient/Guardian gives verbal consent for treatment and assignment of benefits for services provided during this visit. Patient/Guardian expressed understanding and agreed to proceed.   Melynda Stagger, MA, LCSW, [facility], LCAS 03/31/2024

## 2024-04-06 ENCOUNTER — Telehealth (HOSPITAL_COMMUNITY): Admitting: Medical

## 2024-04-09 ENCOUNTER — Telehealth (HOSPITAL_COMMUNITY): Payer: PRIVATE HEALTH INSURANCE | Admitting: Medical

## 2024-04-14 ENCOUNTER — Ambulatory Visit (HOSPITAL_COMMUNITY): Payer: PRIVATE HEALTH INSURANCE | Admitting: Licensed Clinical Social Worker

## 2024-04-14 ENCOUNTER — Encounter (HOSPITAL_COMMUNITY): Payer: Self-pay | Admitting: Licensed Clinical Social Worker

## 2024-04-16 ENCOUNTER — Encounter (HOSPITAL_COMMUNITY): Payer: Self-pay | Admitting: Medical

## 2024-04-16 NOTE — Progress Notes (Signed)
 error

## 2024-05-08 ENCOUNTER — Encounter: Payer: Self-pay | Admitting: Advanced Practice Midwife

## 2024-05-18 ENCOUNTER — Ambulatory Visit: Admitting: Nurse Practitioner

## 2024-10-08 ENCOUNTER — Ambulatory Visit: Admitting: Nurse Practitioner

## 2024-11-16 ENCOUNTER — Telehealth: Admitting: General Practice

## 2024-11-16 ENCOUNTER — Encounter: Payer: Self-pay | Admitting: General Practice

## 2024-11-16 VITALS — Ht 72.0 in | Wt 200.0 lb

## 2024-11-16 DIAGNOSIS — Z7689 Persons encountering health services in other specified circumstances: Secondary | ICD-10-CM | POA: Diagnosis not present

## 2024-11-16 DIAGNOSIS — R7689 Other specified abnormal immunological findings in serum: Secondary | ICD-10-CM | POA: Insufficient documentation

## 2024-11-16 DIAGNOSIS — R569 Unspecified convulsions: Secondary | ICD-10-CM | POA: Diagnosis not present

## 2024-11-16 DIAGNOSIS — Z09 Encounter for follow-up examination after completed treatment for conditions other than malignant neoplasm: Secondary | ICD-10-CM | POA: Insufficient documentation

## 2024-11-16 NOTE — Progress Notes (Signed)
 "  Virtual Visit via Video Note  I connected with Nicholas Fletcher on 11/16/24 at  8:40 AM EST by a video enabled telemedicine application and verified that I am speaking with the correct person using two identifiers.  Patient Location: Home Provider Location: Home Office  I discussed the limitations, risks, security, and privacy concerns of performing an evaluation and management service by video and the availability of in person appointments. I also discussed with the patient that there may be a patient responsible charge related to this service. The patient expressed understanding and agreed to proceed.  Subjective: PCP: Vincente Shivers, NP  Chief Complaint  Patient presents with   New Patient (Initial Visit)    Establish care    Hospitalization Follow-up    Was having seizures due to alcohol; much better now.     HPI  Last PCP/physical/labs: [facility]. No recent physical. No recent labs.   Discussed the use of AI scribe software for clinical note transcription with the patient, who gave verbal consent to proceed.  History of Present Illness Nicholas Fletcher is a 49 year old male with a history of alcohol-induced seizures and positive ANA and CCP who presents for transfer of care and follow-up on seizures and rheumatology referral.  He has a history of alcohol-induced seizures, with the last episode occurring in May 2025. He has been sober for at least six months since his last drink in May 2025. He was hospitalized from May 5th to May 10th, 2025, due to seizures but has not experienced any seizures since then. He was previously on phenobarbital , taking one tablet three times daily, but has completed that course and is not currently on any seizure medication. He has not had a follow-up with neurology since his hospitalization. Overall he is doing well.   He had a positive ANA and CCP in November. He experiences joint swelling and stiffness in November and was seen by ortho. He received a  corticosteroid injection in his shoulder from an orthopedic specialist for shoulder pain, which provided relief. He is scheduled to see rheumatology in March at [facility].   He has not had a physical exam or blood work in the past year. He is currently not on any medications and has not had a recent hospitalization since May 2025.    ROS: Per HPI Current Medications[1]  Observations/Objective: Today's Vitals   11/16/24 0832  Weight: 200 lb (90.7 kg)  Height: 6' (1.829 m)   Physical Exam Nursing note reviewed.  Constitutional:      Appearance: Normal appearance.  Eyes:     Conjunctiva/sclera: Conjunctivae normal.  Pulmonary:     Effort: Pulmonary effort is normal.  Neurological:     Mental Status: He is alert and oriented to person, place, and time.  Psychiatric:        Mood and Affect: Mood normal.        Behavior: Behavior normal.        Thought Content: Thought content normal.        Judgment: Judgment normal.     Assessment and Plan: Establishing care with new doctor, encounter for Assessment & Plan: EMR reviewed briefly.     Hospital discharge follow-up Assessment & Plan: Reviewed hospital notes, labs and imaging results in Epic.   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  No current medication.  - Referred to neurology for seizure management.   Positive ANA Positive ANA suggest possible rheumatoid arthritis. Joint stiffness and swelling improved slightly after steroid injection.  - Continue with [facility] rheumatology appointment on March 9th, 2026.   General health maintenance No recent physical exam or blood work. Need comprehensive evaluation. - Scheduled physical exam and fasting blood work for February 4th, 2026. - Ensure fasting for 4 hours prior to blood work.   Follow  Up Instructions: Return in about 9 days (around 11/25/2024) for physical and fasting labs. .   I discussed the assessment and treatment plan with the patient. The patient was provided an opportunity to ask questions, and all were answered. The patient agreed with the plan and demonstrated an understanding of the instructions.   The patient was advised to call back or seek an in-person evaluation if the symptoms worsen or if the condition fails to improve as anticipated.  The above assessment and management plan was discussed with the patient. The patient verbalized understanding of and has agreed to the management plan.   Carrol Aurora, NP     [1] No current outpatient medications on file.  "

## 2024-11-16 NOTE — Assessment & Plan Note (Signed)
 EMR reviewed briefly.

## 2024-11-16 NOTE — Patient Instructions (Addendum)
 You will either be contacted via phone regarding your referral to neurology , or you may receive a letter on your MyChart portal from our referral team with instructions for scheduling an appointment. Please let us  know if you have not been contacted by anyone within two weeks.   Follow up in office in 2 weeks for physical and fasting labs.   It was a pleasure to meet you today! Please don't hesitate to contact me with any questions. Welcome to [facility]!

## 2024-11-16 NOTE — Assessment & Plan Note (Addendum)
 Reviewed hospital notes, labs and imaging results in Epic.

## 2024-11-25 ENCOUNTER — Ambulatory Visit: Admitting: General Practice

## 2024-11-25 ENCOUNTER — Encounter: Payer: Self-pay | Admitting: General Practice

## 2024-11-25 ENCOUNTER — Ambulatory Visit: Payer: Self-pay | Admitting: General Practice

## 2024-11-25 ENCOUNTER — Ambulatory Visit (INDEPENDENT_AMBULATORY_CARE_PROVIDER_SITE_OTHER)
Admission: RE | Admit: 2024-11-25 | Discharge: 2024-11-25 | Disposition: A | Discharge status: Home / Self Care | Source: Ambulatory Visit | Attending: General Practice | Admitting: General Practice

## 2024-11-25 VITALS — BP 124/76 | HR 65 | Temp 97.7°F | Ht 70.9 in | Wt 206.0 lb

## 2024-11-25 DIAGNOSIS — M545 Low back pain, unspecified: Secondary | ICD-10-CM

## 2024-11-25 DIAGNOSIS — R7689 Other specified abnormal immunological findings in serum: Secondary | ICD-10-CM

## 2024-11-25 DIAGNOSIS — G40209 Localization-related (focal) (partial) symptomatic epilepsy and epileptic syndromes with complex partial seizures, not intractable, without status epilepticus: Secondary | ICD-10-CM

## 2024-11-25 DIAGNOSIS — R7303 Prediabetes: Secondary | ICD-10-CM | POA: Insufficient documentation

## 2024-11-25 DIAGNOSIS — R7989 Other specified abnormal findings of blood chemistry: Secondary | ICD-10-CM

## 2024-11-25 DIAGNOSIS — Z0000 Encounter for general adult medical examination without abnormal findings: Secondary | ICD-10-CM | POA: Insufficient documentation

## 2024-11-25 DIAGNOSIS — R6882 Decreased libido: Secondary | ICD-10-CM | POA: Insufficient documentation

## 2024-11-25 LAB — COMPREHENSIVE METABOLIC PANEL WITH GFR
ALT: 11 U/L (ref 3–53)
AST: 20 U/L (ref 5–37)
Albumin: 4.3 g/dL (ref 3.5–5.2)
Alkaline Phosphatase: 91 U/L (ref 39–117)
BUN: 10 mg/dL (ref 6–23)
CO2: 29 meq/L (ref 19–32)
Calcium: 9.8 mg/dL (ref 8.4–10.5)
Chloride: 101 meq/L (ref 96–112)
Creatinine, Ser: 1 mg/dL (ref 0.40–1.50)
GFR: 87.56 mL/min
Glucose, Bld: 88 mg/dL (ref 70–99)
Potassium: 4.3 meq/L (ref 3.5–5.1)
Sodium: 138 meq/L (ref 135–145)
Total Bilirubin: 1.2 mg/dL (ref 0.2–1.2)
Total Protein: 6.7 g/dL (ref 6.0–8.3)

## 2024-11-25 LAB — CBC
HCT: 43.6 % (ref 39.0–52.0)
Hemoglobin: 14.9 g/dL (ref 13.0–17.0)
MCHC: 34.2 g/dL (ref 30.0–36.0)
MCV: 92.2 fl (ref 78.0–100.0)
Platelets: 237 10*3/uL (ref 150.0–400.0)
RBC: 4.72 Mil/uL (ref 4.22–5.81)
RDW: 13.9 % (ref 11.5–15.5)
WBC: 6.2 10*3/uL (ref 4.0–10.5)

## 2024-11-25 LAB — PSA: PSA: 1.31 ng/mL (ref 0.10–4.00)

## 2024-11-25 LAB — LIPID PANEL
Cholesterol: 152 mg/dL (ref 28–200)
HDL: 65 mg/dL
LDL Cholesterol: 76 mg/dL (ref 10–99)
NonHDL: 87.22
Total CHOL/HDL Ratio: 2
Triglycerides: 58 mg/dL (ref 10.0–149.0)
VLDL: 11.6 mg/dL (ref 0.0–40.0)

## 2024-11-25 LAB — HEMOGLOBIN A1C: Hgb A1c MFr Bld: 5.7 % (ref 4.6–6.5)

## 2024-11-25 LAB — TSH: TSH: 1.52 u[IU]/mL (ref 0.35–5.50)

## 2024-11-25 MED ORDER — PREDNISONE 10 MG (21) PO TBPK: ORAL_TABLET | ORAL | 0 refills | Status: AC

## 2024-11-25 NOTE — Assessment & Plan Note (Signed)
 Immunizations UTD. Colonoscopy UTD, due  PSA due and pending.  Discussed the importance of a healthy diet and regular exercise in order for weight loss, and to reduce the risk of further co-morbidity.  Exam stable. Labs pending.  Follow up in 1 year for repeat physical.

## 2024-11-25 NOTE — Assessment & Plan Note (Signed)
 A1c pending.

## 2024-11-25 NOTE — Patient Instructions (Addendum)
 Stop by the lab prior to leaving today. I will notify you of your results once received.    Complete xray(s) prior to leaving today. I will notify you of your results once received.   Consider Shingrix and prevnar 20.   Follow up in one year for physical.   It was a pleasure to see you today!

## 2024-11-25 NOTE — Progress Notes (Signed)
 "  Established Patient Office Visit  Subjective   Patient ID: Nicholas Fletcher, male    DOB: 1976-05-03  Age: 49 y.o. MRN: [id number]  Chief Complaint  Patient presents with   Annual Exam    With fasting labs    HPI  Nicholas Fletcher is a 49 year old male with past medical ETOH abuse, elevated LFTs, seizure disorder, positive ANA presents today for complete physical and follow up of chronic conditions.  Immunizations: -Tetanus: Completed in 2020 -Influenza: due -Shingles: due -Pneumonia: due  Diet: Fair diet.  Exercise: No regular exercise.  Eye exam: Completes annually  Dental exam: Completes semi-annually    Colonoscopy: Completed in 2022  Low back pain: onset one week ago while working. Started when he picked up something from the floor. 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 Pain did not radiate down to leg but radiated up to his neck. Pain is better and is now a 4/10. Not radiating anymore. Declines urinary and stool incontinence. Denies any urinary symptoms other than frequency which has been going on for longer time.  Decreased sex drive: has been going on for sometime now. Can get an erection at times but sometimes not. Concerned about decreased sex drive. Denies any other symptoms.  Patient Active Problem List   Diagnosis Date Noted   Encounter for screening and preventative care 11/25/2024   Acute right-sided low back pain without sciatica 11/25/2024   Prediabetes 11/25/2024   Decreased sex drive 97/95/7973   Hospital discharge follow-up 11/16/2024   Seizures (HCC) 11/16/2024   Positive ANA (antinuclear antibody) 11/16/2024   Elevated LFTs 02/29/2024   Seizure disorder, complex partial (HCC) 09/12/2023   ETOH abuse 06/24/2022   Past Medical History:  Diagnosis Date   Medical history non-contributory    Seizures (HCC)    pt reports having a seizure 05/2023   Past Surgical History:  Procedure Laterality Date   COLONOSCOPY WITH PROPOFOL  N/A  09/26/2021   Procedure: COLONOSCOPY WITH PROPOFOL ;  Surgeon: Maryruth Ole DASEN, MD;  Location: [facility] ENDOSCOPY;  Service: Endoscopy;  Laterality: N/A;   COLONOSCOPY WITH PROPOFOL  N/A 09/28/2021   Procedure: COLONOSCOPY WITH PROPOFOL ;  Surgeon: Maryruth Ole DASEN, MD;  Location: [facility] ENDOSCOPY;  Service: Endoscopy;  Laterality: N/A;   ESOPHAGOGASTRODUODENOSCOPY (EGD) WITH PROPOFOL  N/A 09/26/2021   Procedure: ESOPHAGOGASTRODUODENOSCOPY (EGD) WITH PROPOFOL ;  Surgeon: Maryruth Ole DASEN, MD;  Location: [facility] ENDOSCOPY;  Service: Endoscopy;  Laterality: N/A;   TONSILLECTOMY     Allergies[1]       11/25/2024   12:00 PM 03/23/2024    3:29 PM 09/18/2023    9:01 AM  Depression screen PHQ 2/9  Decreased Interest 0    Down, Depressed, Hopeless 0    PHQ - 2 Score 0    Altered sleeping 0    Tired, decreased energy 0    Change in appetite 0    Feeling bad or failure about yourself  0    Trouble concentrating 0    Moving slowly or fidgety/restless 0    Suicidal thoughts 0    PHQ-9 Score 0    Difficult doing work/chores Not difficult at all       Information is confidential and restricted. Go to Review Flowsheets to unlock data.       11/25/2024   12:01 PM 09/18/2023    9:02 AM  GAD 7 : Generalized Anxiety Score  Nervous, Anxious, on Edge 0   Control/stop worrying 0   Worry too much -  different things 0   Trouble relaxing 0   Restless 0   Easily annoyed or irritable 0   Afraid - awful might happen 0   Total GAD 7 Score 0   Anxiety Difficulty Not difficult at all      Information is confidential and restricted. Go to Review Flowsheets to unlock data.      Review of Systems  Constitutional:  Negative for chills, fever, malaise/fatigue and weight loss.  HENT:  Negative for congestion, ear discharge, ear pain, hearing loss, nosebleeds, sinus pain, sore throat and tinnitus.   Eyes:  Negative for blurred vision, double vision, pain, discharge and redness.  Respiratory:  Negative for cough,  shortness of breath, wheezing and stridor.   Cardiovascular:  Negative for chest pain, palpitations and leg swelling.  Gastrointestinal:  Negative for abdominal pain, constipation, diarrhea, heartburn, nausea and vomiting.  Genitourinary:  Positive for frequency. Negative for dysuria and urgency.  Musculoskeletal:  Positive for back pain. Negative for myalgias.  Skin:  Negative for rash.  Neurological:  Negative for dizziness, tingling, seizures, weakness and headaches.  Psychiatric/Behavioral:  Negative for depression, substance abuse and suicidal ideas. The patient is not nervous/anxious.       Objective:     BP 124/76   Pulse 65   Temp 97.7 F (36.5 C) (Temporal)   Ht 5' 10.9 (1.801 m)   Wt 206 lb (93.4 kg)   SpO2 97%   BMI 28.81 kg/m  BP Readings from Last 3 Encounters:  11/25/24 124/76  02/29/24 (!) 136/98  08/29/23 (!) 151/103   Wt Readings from Last 3 Encounters:  11/25/24 206 lb (93.4 kg)  11/16/24 200 lb (90.7 kg)  02/29/24 169 lb 5 oz (76.8 kg)      Physical Exam Vitals and nursing note reviewed.  Constitutional:      Appearance: Normal appearance.  HENT:     Head: Normocephalic and atraumatic.     Right Ear: Tympanic membrane, ear canal and external ear normal.     Left Ear: Tympanic membrane, ear canal and external ear normal.     Nose: Nose normal.     Mouth/Throat:     Mouth: Mucous membranes are moist.     Pharynx: Oropharynx is clear.  Eyes:     Conjunctiva/sclera: Conjunctivae normal.     Pupils: Pupils are equal, round, and reactive to light.  Cardiovascular:     Rate and Rhythm: Normal rate and regular rhythm.     Pulses: Normal pulses.     Heart sounds: Normal heart sounds.  Pulmonary:     Effort: Pulmonary effort is normal.     Breath sounds: Normal breath sounds.  Abdominal:     General: Abdomen is flat. Bowel sounds are normal.     Palpations: Abdomen is soft.  Musculoskeletal:     Cervical back: Normal range of motion.      Thoracic back: Normal range of motion.     Lumbar back: No bony tenderness. Decreased range of motion.  Skin:    General: Skin is warm and dry.     Capillary Refill: Capillary refill takes less than 2 seconds.  Neurological:     General: No focal deficit present.     Mental Status: He is alert and oriented to person, place, and time. Mental status is at baseline.  Psychiatric:        Mood and Affect: Mood normal.        Behavior: Behavior normal.  Thought Content: Thought content normal.        Judgment: Judgment normal.      No results found for any visits on 11/25/24.     The 10-year ASCVD risk score (Arnett DK, et al., 2019) is: 4%    Assessment & Plan:  Encounter for screening and preventative care Assessment & Plan: Immunizations UTD. Colonoscopy UTD, due  PSA due and pending.  Discussed the importance of a healthy diet and regular exercise in order for weight loss, and to reduce the risk of further co-morbidity.  Exam stable. Labs pending.  Follow up in 1 year for repeat physical.   Orders: -     CBC -     Comprehensive metabolic panel with GFR -     Lipid panel -     TSH -     Hemoglobin A1c  Acute right-sided low back pain without sciatica Assessment & Plan: Recommended ice/heat as tolerated.  Continue pain rub and patches.   STAT x-ray pending.  Await results.  Orders: -     DG Lumbar Spine Complete  Prediabetes Assessment & Plan: A1c pending.   Seizure disorder, complex partial [facility]) Assessment & Plan: Referral to neurology pending.   Elevated LFTs -     Comprehensive metabolic panel with GFR  Decreased sex drive Assessment & Plan: Unclear etiology.  Labs pending to rule out metabolic cause.   Did not check testosterone today as it may be not be accurate reading since its past 10 am.  Consider urology referral.  Orders: -     PSA  Positive ANA (antinuclear antibody) Assessment & Plan: Referral to rheumatology  pending.      Return in about 1 year (around 11/25/2025) for physical and fasting labs.SABRA Carrol Aurora, NP     [1]  Allergies Allergen Reactions   Chlordiazepoxide  Other (See Comments)    unknown   Trazodone  Other (See Comments)    hallucinations   "

## 2024-11-25 NOTE — Assessment & Plan Note (Signed)
"  Referral to neurology pending  "

## 2024-11-25 NOTE — Assessment & Plan Note (Signed)
 Unclear etiology.  Labs pending to rule out metabolic cause.   Did not check testosterone today as it may be not be accurate reading since its past 10 am.  Consider urology referral.

## 2024-11-25 NOTE — Assessment & Plan Note (Signed)
 Recommended ice/heat as tolerated.  Continue pain rub and patches.   STAT x-ray pending.  Await results.

## 2024-11-25 NOTE — Assessment & Plan Note (Signed)
Referral to rheumatology pending

## 2025-11-29 ENCOUNTER — Encounter: Admitting: General Practice
